# Patient Record
Sex: Female | Born: 1960 | Race: White | Hispanic: No | Smoking: Never smoker
Health system: Southern US, Community
[De-identification: ages and names within clinical notes are randomized; demographics above are authoritative.]

## PROBLEM LIST (undated history)

## (undated) DIAGNOSIS — H269 Unspecified cataract: Secondary | ICD-10-CM

## (undated) DIAGNOSIS — M1990 Unspecified osteoarthritis, unspecified site: Secondary | ICD-10-CM

## (undated) DIAGNOSIS — R32 Unspecified urinary incontinence: Secondary | ICD-10-CM

## (undated) DIAGNOSIS — I498 Other specified cardiac arrhythmias: Secondary | ICD-10-CM

## (undated) DIAGNOSIS — D649 Anemia, unspecified: Secondary | ICD-10-CM

## (undated) DIAGNOSIS — M8580 Other specified disorders of bone density and structure, unspecified site: Secondary | ICD-10-CM

## (undated) DIAGNOSIS — Z803 Family history of malignant neoplasm of breast: Secondary | ICD-10-CM

## (undated) DIAGNOSIS — Z8049 Family history of malignant neoplasm of other genital organs: Secondary | ICD-10-CM

## (undated) DIAGNOSIS — K219 Gastro-esophageal reflux disease without esophagitis: Secondary | ICD-10-CM

## (undated) DIAGNOSIS — G4700 Insomnia, unspecified: Secondary | ICD-10-CM

## (undated) DIAGNOSIS — E782 Mixed hyperlipidemia: Secondary | ICD-10-CM

## (undated) DIAGNOSIS — K5730 Diverticulosis of large intestine without perforation or abscess without bleeding: Secondary | ICD-10-CM

## (undated) DIAGNOSIS — IMO0002 Reserved for concepts with insufficient information to code with codable children: Secondary | ICD-10-CM

## (undated) DIAGNOSIS — H6982 Other specified disorders of Eustachian tube, left ear: Secondary | ICD-10-CM

## (undated) HISTORY — DX: Anemia, unspecified: D64.9

## (undated) HISTORY — DX: Mixed hyperlipidemia: E78.2

## (undated) HISTORY — DX: Family history of malignant neoplasm of other genital organs: Z80.49

## (undated) HISTORY — DX: Reserved for concepts with insufficient information to code with codable children: IMO0002

## (undated) HISTORY — DX: Insomnia, unspecified: G47.00

## (undated) HISTORY — PX: COLONOSCOPY: SHX174

## (undated) HISTORY — DX: Family history of malignant neoplasm of breast: Z80.3

## (undated) HISTORY — DX: Unspecified cataract: H26.9

## (undated) HISTORY — DX: Other specified cardiac arrhythmias: I49.8

## (undated) HISTORY — DX: Unspecified urinary incontinence: R32

## (undated) HISTORY — DX: Other specified disorders of eustachian tube, left ear: H69.82

## (undated) HISTORY — DX: Other specified disorders of bone density and structure, unspecified site: M85.80

## (undated) HISTORY — PX: WISDOM TOOTH EXTRACTION: SHX21

## (undated) HISTORY — DX: Diverticulosis of large intestine without perforation or abscess without bleeding: K57.30

## (undated) HISTORY — DX: Gastro-esophageal reflux disease without esophagitis: K21.9

---

## 2001-07-30 LAB — HM PAP SMEAR: HM Pap smear: NORMAL

## 2002-07-30 LAB — HM PAP SMEAR: HM Pap smear: NORMAL

## 2002-12-29 LAB — HM MAMMOGRAPHY: HM Mammogram: 4

## 2004-07-30 LAB — HM PAP SMEAR
HM Pap smear: NORMAL
HM Pap smear: NORMAL

## 2004-10-28 LAB — HM MAMMOGRAPHY: HM Mammogram: 4

## 2005-07-30 LAB — HM PAP SMEAR: HM Pap smear: NORMAL

## 2005-12-28 LAB — HM MAMMOGRAPHY

## 2006-12-29 LAB — HM MAMMOGRAPHY

## 2009-07-30 HISTORY — PX: COLONOSCOPY: SHX174

## 2009-10-25 LAB — HM COLONOSCOPY

## 2010-05-30 HISTORY — PX: VAGINAL HYSTERECTOMY: SUR661

## 2013-01-19 ENCOUNTER — Ambulatory Visit (INDEPENDENT_AMBULATORY_CARE_PROVIDER_SITE_OTHER): Payer: BC Managed Care – PPO | Admitting: Nurse Practitioner

## 2013-01-19 ENCOUNTER — Other Ambulatory Visit: Payer: BC Managed Care – PPO

## 2013-01-19 ENCOUNTER — Encounter: Payer: Self-pay | Admitting: Nurse Practitioner

## 2013-01-19 VITALS — BP 130/80 | HR 79 | Temp 98.0°F | Resp 12 | Ht 64.0 in | Wt 164.0 lb

## 2013-01-19 DIAGNOSIS — G4700 Insomnia, unspecified: Secondary | ICD-10-CM | POA: Insufficient documentation

## 2013-01-19 DIAGNOSIS — Z0000 Encounter for general adult medical examination without abnormal findings: Secondary | ICD-10-CM

## 2013-01-19 DIAGNOSIS — Z8249 Family history of ischemic heart disease and other diseases of the circulatory system: Secondary | ICD-10-CM | POA: Insufficient documentation

## 2013-01-19 DIAGNOSIS — M545 Low back pain, unspecified: Secondary | ICD-10-CM

## 2013-01-19 DIAGNOSIS — Z1239 Encounter for other screening for malignant neoplasm of breast: Secondary | ICD-10-CM

## 2013-01-19 MED ORDER — ZOLPIDEM TARTRATE 10 MG PO TABS: 10.0000 mg | ORAL_TABLET | Freq: Every evening | ORAL | Status: DC | PRN

## 2013-01-19 NOTE — Patient Instructions (Addendum)
Preventive Care for Adults, Female A healthy lifestyle and preventive care can promote health and wellness. Preventive health guidelines for women include the following key practices.  A routine yearly physical is a good way to check with your caregiver about your health and preventive screening. It is a chance to share any concerns and updates on your health, and to receive a thorough exam.  Visit your dentist for a routine exam and preventive care every 6 months. Brush your teeth twice a day and floss once a day. Good oral hygiene prevents tooth decay and gum disease.  The frequency of eye exams is based on your age, health, family medical history, use of contact lenses, and other factors. Follow your caregiver's recommendations for frequency of eye exams.  Eat a healthy diet. Foods like vegetables, fruits, whole grains, low-fat dairy products, and lean protein foods contain the nutrients you need without too many calories. Decrease your intake of foods high in solid fats, added sugars, and salt. Eat the right amount of calories for you.Get information about a proper diet from your caregiver, if necessary.  Regular physical exercise is one of the most important things you can do for your health. Most adults should get at least 150 minutes of moderate-intensity exercise (any activity that increases your heart rate and causes you to sweat) each week. In addition, most adults need muscle-strengthening exercises on 2 or more days a week.  Maintain a healthy weight. The body mass index (BMI) is a screening tool to identify possible weight problems. It provides an estimate of body fat based on height and weight. Your caregiver can help determine your BMI, and can help you achieve or maintain a healthy weight.For adults 20 years and older:  A BMI below 18.5 is considered underweight.  A BMI of 18.5 to 24.9 is normal.  A BMI of 25 to 29.9 is considered overweight.  A BMI of 30 and above is  considered obese.  Maintain normal blood lipids and cholesterol levels by exercising and minimizing your intake of saturated fat. Eat a balanced diet with plenty of fruit and vegetables. Blood tests for lipids and cholesterol should begin at age 20 and be repeated every 5 years. If your lipid or cholesterol levels are high, you are over 50, or you are at high risk for heart disease, you may need your cholesterol levels checked more frequently.Ongoing high lipid and cholesterol levels should be treated with medicines if diet and exercise are not effective.  If you smoke, find out from your caregiver how to quit. If you do not use tobacco, do not start.  If you are pregnant, do not drink alcohol. If you are breastfeeding, be very cautious about drinking alcohol. If you are not pregnant and choose to drink alcohol, do not exceed 1 drink per day. One drink is considered to be 12 ounces (355 mL) of beer, 5 ounces (148 mL) of wine, or 1.5 ounces (44 mL) of liquor.  Avoid use of street drugs. Do not share needles with anyone. Ask for help if you need support or instructions about stopping the use of drugs.  High blood pressure causes heart disease and increases the risk of stroke. Your blood pressure should be checked at least every 1 to 2 years. Ongoing high blood pressure should be treated with medicines if weight loss and exercise are not effective.  If you are 55 to 54 years old, ask your caregiver if you should take aspirin to prevent strokes.  Diabetes   screening involves taking a blood sample to check your fasting blood sugar level. This should be done once every 3 years, after age 45, if you are within normal weight and without risk factors for diabetes. Testing should be considered at a younger age or be carried out more frequently if you are overweight and have at least 1 risk factor for diabetes.  Breast cancer screening is essential preventive care for women. You should practice "breast  self-awareness." This means understanding the normal appearance and feel of your breasts and may include breast self-examination. Any changes detected, no matter how small, should be reported to a caregiver. Women in their 20s and 30s should have a clinical breast exam (CBE) by a caregiver as part of a regular health exam every 1 to 3 years. After age 40, women should have a CBE every year. Starting at age 40, women should consider having a mammography (breast X-ray test) every year. Women who have a family history of breast cancer should talk to their caregiver about genetic screening. Women at a high risk of breast cancer should talk to their caregivers about having magnetic resonance imaging (MRI) and a mammography every year.  The Pap test is a screening test for cervical cancer. A Pap test can show cell changes on the cervix that might become cervical cancer if left untreated. A Pap test is a procedure in which cells are obtained and examined from the lower end of the uterus (cervix).  Women should have a Pap test starting at age 21.  Between ages 21 and 29, Pap tests should be repeated every 2 years.  Beginning at age 30, you should have a Pap test every 3 years as long as the past 3 Pap tests have been normal.  Some women have medical problems that increase the chance of getting cervical cancer. Talk to your caregiver about these problems. It is especially important to talk to your caregiver if a new problem develops soon after your last Pap test. In these cases, your caregiver may recommend more frequent screening and Pap tests.  The above recommendations are the same for women who have or have not gotten the vaccine for human papillomavirus (HPV).  If you had a hysterectomy for a problem that was not cancer or a condition that could lead to cancer, then you no longer need Pap tests. Even if you no longer need a Pap test, a regular exam is a good idea to make sure no other problems are  starting.  If you are between ages 65 and 70, and you have had normal Pap tests going back 10 years, you no longer need Pap tests. Even if you no longer need a Pap test, a regular exam is a good idea to make sure no other problems are starting.  If you have had past treatment for cervical cancer or a condition that could lead to cancer, you need Pap tests and screening for cancer for at least 20 years after your treatment.  If Pap tests have been discontinued, risk factors (such as a new sexual partner) need to be reassessed to determine if screening should be resumed.  The HPV test is an additional test that may be used for cervical cancer screening. The HPV test looks for the virus that can cause the cell changes on the cervix. The cells collected during the Pap test can be tested for HPV. The HPV test could be used to screen women aged 30 years and older, and should   be used in women of any age who have unclear Pap test results. After the age of 30, women should have HPV testing at the same frequency as a Pap test.  Colorectal cancer can be detected and often prevented. Most routine colorectal cancer screening begins at the age of 50 and continues through age 75. However, your caregiver may recommend screening at an earlier age if you have risk factors for colon cancer. On a yearly basis, your caregiver may provide home test kits to check for hidden blood in the stool. Use of a small camera at the end of a tube, to directly examine the colon (sigmoidoscopy or colonoscopy), can detect the earliest forms of colorectal cancer. Talk to your caregiver about this at age 50, when routine screening begins. Direct examination of the colon should be repeated every 5 to 10 years through age 75, unless early forms of pre-cancerous polyps or small growths are found.  Hepatitis C blood testing is recommended for all people born from 1945 through 1965 and any individual with known risks for hepatitis C.  Practice  safe sex. Use condoms and avoid high-risk sexual practices to reduce the spread of sexually transmitted infections (STIs). STIs include gonorrhea, chlamydia, syphilis, trichomonas, herpes, HPV, and human immunodeficiency virus (HIV). Herpes, HIV, and HPV are viral illnesses that have no cure. They can result in disability, cancer, and death. Sexually active women aged 25 and younger should be checked for chlamydia. Older women with new or multiple partners should also be tested for chlamydia. Testing for other STIs is recommended if you are sexually active and at increased risk.  Osteoporosis is a disease in which the bones lose minerals and strength with aging. This can result in serious bone fractures. The risk of osteoporosis can be identified using a bone density scan. Women ages 65 and over and women at risk for fractures or osteoporosis should discuss screening with their caregivers. Ask your caregiver whether you should take a calcium supplement or vitamin D to reduce the rate of osteoporosis.  Menopause can be associated with physical symptoms and risks. Hormone replacement therapy is available to decrease symptoms and risks. You should talk to your caregiver about whether hormone replacement therapy is right for you.  Use sunscreen with sun protection factor (SPF) of 30 or more. Apply sunscreen liberally and repeatedly throughout the day. You should seek shade when your shadow is shorter than you. Protect yourself by wearing long sleeves, pants, a wide-brimmed hat, and sunglasses year round, whenever you are outdoors.  Once a month, do a whole body skin exam, using a mirror to look at the skin on your back. Notify your caregiver of new moles, moles that have irregular borders, moles that are larger than a pencil eraser, or moles that have changed in shape or color.  Stay current with required immunizations.  Influenza. You need a dose every fall (or winter). The composition of the flu vaccine  changes each year, so being vaccinated once is not enough.  Pneumococcal polysaccharide. You need 1 to 2 doses if you smoke cigarettes or if you have certain chronic medical conditions. You need 1 dose at age 65 (or older) if you have never been vaccinated.  Tetanus, diphtheria, pertussis (Tdap, Td). Get 1 dose of Tdap vaccine if you are younger than age 65, are over 65 and have contact with an infant, are a healthcare worker, are pregnant, or simply want to be protected from whooping cough. After that, you need a Td   booster dose every 10 years. Consult your caregiver if you have not had at least 3 tetanus and diphtheria-containing shots sometime in your life or have a deep or dirty wound.  HPV. You need this vaccine if you are a woman age 26 or younger. The vaccine is given in 3 doses over 6 months.  Measles, mumps, rubella (MMR). You need at least 1 dose of MMR if you were born in 1957 or later. You may also need a second dose.  Meningococcal. If you are age 19 to 21 and a first-year college student living in a residence hall, or have one of several medical conditions, you need to get vaccinated against meningococcal disease. You may also need additional booster doses.  Zoster (shingles). If you are age 60 or older, you should get this vaccine.  Varicella (chickenpox). If you have never had chickenpox or you were vaccinated but received only 1 dose, talk to your caregiver to find out if you need this vaccine.  Hepatitis A. You need this vaccine if you have a specific risk factor for hepatitis A virus infection or you simply wish to be protected from this disease. The vaccine is usually given as 2 doses, 6 to 18 months apart.  Hepatitis B. You need this vaccine if you have a specific risk factor for hepatitis B virus infection or you simply wish to be protected from this disease. The vaccine is given in 3 doses, usually over 6 months. Preventive Services / Frequency Ages 19 to 39  Blood  pressure check.** / Every 1 to 2 years.  Lipid and cholesterol check.** / Every 5 years beginning at age 20.  Clinical breast exam.** / Every 3 years for women in their 20s and 30s.  Pap test.** / Every 2 years from ages 21 through 29. Every 3 years starting at age 30 through age 65 or 70 with a history of 3 consecutive normal Pap tests.  HPV screening.** / Every 3 years from ages 30 through ages 65 to 70 with a history of 3 consecutive normal Pap tests.  Hepatitis C blood test.** / For any individual with known risks for hepatitis C.  Skin self-exam. / Monthly.  Influenza immunization.** / Every year.  Pneumococcal polysaccharide immunization.** / 1 to 2 doses if you smoke cigarettes or if you have certain chronic medical conditions.  Tetanus, diphtheria, pertussis (Tdap, Td) immunization. / A one-time dose of Tdap vaccine. After that, you need a Td booster dose every 10 years.  HPV immunization. / 3 doses over 6 months, if you are 26 and younger.  Measles, mumps, rubella (MMR) immunization. / You need at least 1 dose of MMR if you were born in 1957 or later. You may also need a second dose.  Meningococcal immunization. / 1 dose if you are age 19 to 21 and a first-year college student living in a residence hall, or have one of several medical conditions, you need to get vaccinated against meningococcal disease. You may also need additional booster doses.  Varicella immunization.** / Consult your caregiver.  Hepatitis A immunization.** / Consult your caregiver. 2 doses, 6 to 18 months apart.  Hepatitis B immunization.** / Consult your caregiver. 3 doses usually over 6 months. Ages 40 to 64  Blood pressure check.** / Every 1 to 2 years.  Lipid and cholesterol check.** / Every 5 years beginning at age 20.  Clinical breast exam.** / Every year after age 40.  Mammogram.** / Every year beginning at age 40   and continuing for as long as you are in good health. Consult with your  caregiver.  Pap test.** / Every 3 years starting at age 30 through age 65 or 70 with a history of 3 consecutive normal Pap tests.  HPV screening.** / Every 3 years from ages 30 through ages 65 to 70 with a history of 3 consecutive normal Pap tests.  Fecal occult blood test (FOBT) of stool. / Every year beginning at age 50 and continuing until age 75. You may not need to do this test if you get a colonoscopy every 10 years.  Flexible sigmoidoscopy or colonoscopy.** / Every 5 years for a flexible sigmoidoscopy or every 10 years for a colonoscopy beginning at age 50 and continuing until age 75.  Hepatitis C blood test.** / For all people born from 1945 through 1965 and any individual with known risks for hepatitis C.  Skin self-exam. / Monthly.  Influenza immunization.** / Every year.  Pneumococcal polysaccharide immunization.** / 1 to 2 doses if you smoke cigarettes or if you have certain chronic medical conditions.  Tetanus, diphtheria, pertussis (Tdap, Td) immunization.** / A one-time dose of Tdap vaccine. After that, you need a Td booster dose every 10 years.  Measles, mumps, rubella (MMR) immunization. / You need at least 1 dose of MMR if you were born in 1957 or later. You may also need a second dose.  Varicella immunization.** / Consult your caregiver.  Meningococcal immunization.** / Consult your caregiver.  Hepatitis A immunization.** / Consult your caregiver. 2 doses, 6 to 18 months apart.  Hepatitis B immunization.** / Consult your caregiver. 3 doses, usually over 6 months. Ages 65 and over  Blood pressure check.** / Every 1 to 2 years.  Lipid and cholesterol check.** / Every 5 years beginning at age 20.  Clinical breast exam.** / Every year after age 40.  Mammogram.** / Every year beginning at age 40 and continuing for as long as you are in good health. Consult with your caregiver.  Pap test.** / Every 3 years starting at age 30 through age 65 or 70 with a 3  consecutive normal Pap tests. Testing can be stopped between 65 and 70 with 3 consecutive normal Pap tests and no abnormal Pap or HPV tests in the past 10 years.  HPV screening.** / Every 3 years from ages 30 through ages 65 or 70 with a history of 3 consecutive normal Pap tests. Testing can be stopped between 65 and 70 with 3 consecutive normal Pap tests and no abnormal Pap or HPV tests in the past 10 years.  Fecal occult blood test (FOBT) of stool. / Every year beginning at age 50 and continuing until age 75. You may not need to do this test if you get a colonoscopy every 10 years.  Flexible sigmoidoscopy or colonoscopy.** / Every 5 years for a flexible sigmoidoscopy or every 10 years for a colonoscopy beginning at age 50 and continuing until age 75.  Hepatitis C blood test.** / For all people born from 1945 through 1965 and any individual with known risks for hepatitis C.  Osteoporosis screening.** / A one-time screening for women ages 65 and over and women at risk for fractures or osteoporosis.  Skin self-exam. / Monthly.  Influenza immunization.** / Every year.  Pneumococcal polysaccharide immunization.** / 1 dose at age 65 (or older) if you have never been vaccinated.  Tetanus, diphtheria, pertussis (Tdap, Td) immunization. / A one-time dose of Tdap vaccine if you are over   65 and have contact with an infant, are a Research scientist (physical sciences), or simply want to be protected from whooping cough. After that, you need a Td booster dose every 10 years.  Varicella immunization.** / Consult your caregiver.  Meningococcal immunization.** / Consult your caregiver.  Hepatitis A immunization.** / Consult your caregiver. 2 doses, 6 to 18 months apart.  Hepatitis B immunization.** / Check with your caregiver. 3 doses, usually over 6 months. ** Family history and personal history of risk and conditions may change your caregiver's recommendations. Document Released: 09/11/2001 Document Revised: 10/08/2011  Document Reviewed: 12/11/2010 [facility] Patient Information 2014 [place], [place]. Insomnia Insomnia is frequent trouble falling and/or staying asleep. Insomnia can be a long term problem or a short term problem. Both are common. Insomnia can be a short term problem when the wakefulness is related to a certain stress or worry. Long term insomnia is often related to ongoing stress during waking hours and/or poor sleeping habits. Overtime, sleep deprivation itself can make the problem worse. Every little thing feels more severe because you are overtired and your ability to cope is decreased. CAUSES   Stress, anxiety, and depression.  Poor sleeping habits.  Distractions such as TV in the bedroom.  Naps close to bedtime.  Engaging in emotionally charged conversations before bed.  Technical reading before sleep.  Alcohol and other sedatives. They may make the problem worse. They can hurt normal sleep patterns and normal dream activity.  Stimulants such as caffeine for several hours prior to bedtime.  Pain syndromes and shortness of breath can cause insomnia.  Exercise late at night.  Changing time zones may cause sleeping problems (jet lag). It is sometimes helpful to have someone observe your sleeping patterns. They should look for periods of not breathing during the night (sleep apnea). They should also look to see how long those periods last. If you live alone or observers are uncertain, you can also be observed at a sleep clinic where your sleep patterns will be professionally monitored. Sleep apnea requires a checkup and treatment. Give your caregivers your medical history. Give your caregivers observations your family has made about your sleep.  SYMPTOMS   Not feeling rested in the morning.  Anxiety and restlessness at bedtime.  Difficulty falling and staying asleep. TREATMENT   Your caregiver may prescribe treatment for an underlying medical disorders. Your caregiver can give  advice or help if you are using alcohol or other drugs for self-medication. Treatment of underlying problems will usually eliminate insomnia problems.  Medications can be prescribed for short time use. They are generally not recommended for lengthy use.  Over-the-counter sleep medicines are not recommended for lengthy use. They can be habit forming.  You can promote easier sleeping by making lifestyle changes such as:  Using relaxation techniques that help with breathing and reduce muscle tension.  Exercising earlier in the day.  Changing your diet and the time of your last meal. No night time snacks.  Establish a regular time to go to bed.  Counseling can help with stressful problems and worry.  Soothing music and [place] noise may be helpful if there are background noises you cannot remove.  Stop tedious detailed work at least one hour before bedtime. HOME CARE INSTRUCTIONS   Keep a diary. Inform your caregiver about your progress. This includes any medication side effects. See your caregiver regularly. Take note of:  Times when you are asleep.  Times when you are awake during the night.  The quality of your  sleep.  How you feel the next day. This information will help your caregiver care for you.  Get out of bed if you are still awake after 15 minutes. Read or do some quiet activity. Keep the lights down. Wait until you feel sleepy and go back to bed.  Keep regular sleeping and waking hours. Avoid naps.  Exercise regularly.  Avoid distractions at bedtime. Distractions include watching television or engaging in any intense or detailed activity like attempting to balance the household checkbook.  Develop a bedtime ritual. Keep a familiar routine of bathing, brushing your teeth, climbing into bed at the same time each night, listening to soothing music. Routines increase the success of falling to sleep faster.  Use relaxation techniques. This can be using breathing and  muscle tension release routines. It can also include visualizing peaceful scenes. You can also help control troubling or intruding thoughts by keeping your mind occupied with boring or repetitive thoughts like the old concept of counting sheep. You can make it more creative like imagining planting one beautiful flower after another in your backyard garden.  During your day, work to eliminate stress. When this is not possible use some of the previous suggestions to help reduce the anxiety that accompanies stressful situations. MAKE SURE YOU:   Understand these instructions.  Will watch your condition.  Will get help right away if you are not doing well or get worse. Document Released: 07/13/2000 Document Revised: 10/08/2011 Document Reviewed: 08/13/2007 [facility] Patient Information 2014 [place], [place].  Back Exercises Back exercises help treat and prevent back injuries. The goal of back exercises is to increase the strength of your abdominal and back muscles and the flexibility of your back. These exercises should be started when you no longer have back pain. Back exercises include:  Pelvic Tilt. Lie on your back with your knees bent. Tilt your pelvis until the lower part of your back is against the floor. Hold this position 5 to 10 sec and repeat 5 to 10 times.  Knee to Chest. Pull first 1 knee up against your chest and hold for 20 to 30 seconds, repeat this with the other knee, and then both knees. This may be done with the other leg straight or bent, whichever feels better.  Sit-Ups or Curl-Ups. Bend your knees 90 degrees. Start with tilting your pelvis, and do a partial, slow sit-up, lifting your trunk only 30 to 45 degrees off the floor. Take at least 2 to 3 seconds for each sit-up. Do not do sit-ups with your knees out straight. If partial sit-ups are difficult, simply do the above but with only tightening your abdominal muscles and holding it as directed.  Hip-Lift. Lie on your back with  your knees flexed 90 degrees. Push down with your feet and shoulders as you raise your hips a couple inches off the floor; hold for 10 seconds, repeat 5 to 10 times.  Back arches. Lie on your stomach, propping yourself up on bent elbows. Slowly press on your hands, causing an arch in your low back. Repeat 3 to 5 times. Any initial stiffness and discomfort should lessen with repetition over time.  Shoulder-Lifts. Lie face down with arms beside your body. Keep hips and torso pressed to floor as you slowly lift your head and shoulders off the floor. Do not overdo your exercises, especially in the beginning. Exercises may cause you some mild back discomfort which lasts for a few minutes; however, if the pain is more severe, or lasts  for more than 15 minutes, do not continue exercises until you see your caregiver. Improvement with exercise therapy for back problems is slow.  See your caregivers for assistance with developing a proper back exercise program. Document Released: 08/23/2004 Document Revised: 10/08/2011 Document Reviewed: 05/17/2011 [facility] Patient Information 2014 [place], [place].

## 2013-01-20 ENCOUNTER — Encounter: Payer: Self-pay | Admitting: Family Medicine

## 2013-01-20 ENCOUNTER — Encounter: Payer: Self-pay | Admitting: Nurse Practitioner

## 2013-01-20 DIAGNOSIS — M545 Low back pain, unspecified: Secondary | ICD-10-CM | POA: Insufficient documentation

## 2013-01-20 NOTE — Progress Notes (Signed)
Subjective:     [name] is a 52 y.o. female and is here to establish care. The patient reports insomnia for 5 years that has been treated with [place] with satisfactory results. Also she c/o occasional low back pain that flares with activity such as lifting. Surgical history includes partial hysterectomy in 2011 for uterine prolapse. She denies perimenopausal symptoms such as hot flashes.  Family Hx includes premature heart disease from her father who has an MI at age 6. No other significant history.Marland Kitchen  History   Social History  . Marital Status: Married    Spouse Name: Cheree Ditto    Number of Children: 2  . Years of Education: 4   Occupational History  . teacher, reading specialist k-6    Social History Main Topics  . Smoking status: Never Smoker   . Smokeless tobacco: Never Used  . Alcohol Use: Yes     Comment: approx. 6 times/year  . Drug Use: No  . Sexually Active: Yes     Comment: hysterectomy   Other Topics Concern  . Not on file   Social History Narrative   Relocated to [facility] [place] from [place]. Due to husband job transfer. 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 This fall.      Has had regular preventive care in [place]. At [facility] w/Dr. Criss Alvine. Only health concern is insomnia that developed about 5 years ago & has been treated with [place]. She has not practiced sleep hygiene.   No health maintenance topics applied.  The following portions of the patient's history were reviewed and updated as appropriate: allergies, current medications, past family history, past medical history, past social history, past surgical history and problem list.  Review of Systems Constitutional: negative for anorexia, fatigue, fevers, night sweats and weight loss Eyes: positive for contacts/glasses Ears, nose, mouth, throat, and face: negative for hearing loss,  nasal congestion, sore throat and voice change Respiratory: negative for asthma, cough, dyspnea on exertion and wheezing Cardiovascular: positive for palpitations, negative for chest pain, chest pressure/discomfort, exertional chest pressure/discomfort, fatigue and lower extremity edema Gastrointestinal: negative for abdominal pain, change in bowel habits, constipation, diarrhea, dyspepsia and reflux symptoms Genitourinary:positive for occasional UTI Integument/breast: negative, pt sees dermatology yearly for "mole check" Musculoskeletal:positive for back pain Neurological: negative for headaches, memory problems and weakness Behavioral/Psych: positive for sleep disturbance, negative for anxiety and tobacco use Endocrine: negative for diabetic symptoms including blurry vision, increased fatigue, polydipsia, polyphagia and polyuria and temperature intolerance Allergic/Immunologic: negative for anaphylaxis and hay fever   Objective:    There were no vitals taken for this visit. General appearance: alert, cooperative, appears stated age and mild distress Head: Normocephalic, without obvious abnormality, atraumatic Eyes: negative findings: lids and lashes normal, conjunctivae and sclerae normal, corneas clear and pupils equal, round, reactive to light and accomodation, wearing glasses Ears: normal external ear and canal bilaterally, effusion w/clear fluid bilaterally Throat: lips, mucosa, and tongue normal; teeth and gums normal Neck: no adenopathy, no carotid bruit, supple, symmetrical, trachea midline and thyroid not enlarged, symmetric, no tenderness/mass/nodules Lungs: clear to auscultation bilaterally Heart: regular rate and rhythm, S1, S2 normal, no murmur, click, rub or gallop Abdomen: soft, non-tender; bowel sounds normal; no masses,  no organomegaly Extremities: extremities normal, atraumatic, no cyanosis or edema Pulses: 2+ and symmetric Lymph nodes: Cervical, supraclavicular, and  axillary nodes normal.  Neurologic: Alert and oriented X 3, normal strength and tone. Normal symmetric reflexes. Normal coordination and gait    Assessment:    1.Insomnia 2.recurrent back strain 3.Health Maintenance       Plan:    1. Historically treated w/ambien. Discussed concerns with potential side effects, discussed sleep hygiene. Wishes to continue with ambien. 2.Takes flexeril as needed. Discussed back stretches. Gave info for back health. 3.PAP, MMG, COlonoscopy, & Dexa up to date. All normal per  Pt report, except DEXA showed osteopenia. Uncertain about Tdap-will review records when received. See After Visit Summary for Counseling Recommendations

## 2013-01-26 ENCOUNTER — Other Ambulatory Visit (INDEPENDENT_AMBULATORY_CARE_PROVIDER_SITE_OTHER): Payer: BC Managed Care – PPO

## 2013-01-26 DIAGNOSIS — M545 Low back pain, unspecified: Secondary | ICD-10-CM

## 2013-01-26 DIAGNOSIS — Z0000 Encounter for general adult medical examination without abnormal findings: Secondary | ICD-10-CM

## 2013-01-26 LAB — CBC WITH DIFFERENTIAL/PLATELET
Eosinophils Absolute: 0.1 10*3/uL (ref 0.0–0.7)
Eosinophils Relative: 1.7 % (ref 0.0–5.0)
MCHC: 33.8 g/dL (ref 30.0–36.0)
MCV: 91.1 fl (ref 78.0–100.0)
Monocytes Absolute: 0.5 10*3/uL (ref 0.1–1.0)
Neutrophils Relative %: 54.7 % (ref 43.0–77.0)
Platelets: 261 10*3/uL (ref 150.0–400.0)
WBC: 6.7 10*3/uL (ref 4.5–10.5)

## 2013-01-26 LAB — HEPATIC FUNCTION PANEL
Albumin: 4.2 g/dL (ref 3.5–5.2)
Alkaline Phosphatase: 77 U/L (ref 39–117)
Bilirubin, Direct: 0.1 mg/dL (ref 0.0–0.3)
Total Bilirubin: 0.5 mg/dL (ref 0.3–1.2)

## 2013-01-26 LAB — LIPID PANEL
HDL: 36.9 mg/dL — ABNORMAL LOW (ref >39.00)
Triglycerides: 592 mg/dL — ABNORMAL HIGH (ref 0.0–149.0)

## 2013-01-26 LAB — RENAL FUNCTION PANEL
BUN: 17 mg/dL (ref 6–23)
CO2: 23 mEq/L (ref 19–32)
Calcium: 9.4 mg/dL (ref 8.4–10.5)
Chloride: 104 mEq/L (ref 96–112)
GFR: 72.21 mL/min (ref >60.00)

## 2013-01-26 NOTE — Progress Notes (Signed)
Labs only

## 2013-01-27 ENCOUNTER — Ambulatory Visit: Payer: BC Managed Care – PPO

## 2013-01-27 ENCOUNTER — Telehealth: Payer: Self-pay | Admitting: Nurse Practitioner

## 2013-01-27 ENCOUNTER — Other Ambulatory Visit: Payer: BC Managed Care – PPO

## 2013-01-27 DIAGNOSIS — E781 Pure hyperglyceridemia: Secondary | ICD-10-CM

## 2013-01-27 DIAGNOSIS — E559 Vitamin D deficiency, unspecified: Secondary | ICD-10-CM

## 2013-01-27 LAB — LIPID PANEL
Cholesterol: 221 mg/dL — ABNORMAL HIGH (ref 0–200)
Triglycerides: 555 mg/dL — ABNORMAL HIGH (ref <150)

## 2013-01-27 LAB — CBC WITH DIFFERENTIAL/PLATELET

## 2013-01-27 LAB — HEPATIC FUNCTION PANEL
ALT: 13 U/L (ref 0–35)
Total Protein: 6.8 g/dL (ref 6.0–8.3)

## 2013-01-27 LAB — RENAL FUNCTION PANEL
BUN: 16 mg/dL (ref 6–23)
Calcium: 9.3 mg/dL (ref 8.4–10.5)
Creat: 0.86 mg/dL (ref 0.50–1.10)
Phosphorus: 3.4 mg/dL (ref 2.3–4.6)
Potassium: 4 mEq/L (ref 3.5–5.3)

## 2013-01-27 LAB — VITAMIN D 25 HYDROXY (VIT D DEFICIENCY, FRACTURES): Vit D, 25-Hydroxy: 28 ng/mL — ABNORMAL LOW (ref 30–89)

## 2013-01-27 LAB — LDL CHOLESTEROL, DIRECT: Direct LDL: 105.7 mg/dL

## 2013-01-27 LAB — HEMOGLOBIN A1C: Hgb A1c MFr Bld: 6 % (ref 4.6–6.5)

## 2013-01-27 NOTE — Telephone Encounter (Signed)
Discussed labs. Will start vit d supplement for deficiency: 5000 iu D3 2 caps qd 5d weekly, recheck in 12 weeks.  Cholesterol panel shows LDl norm range, HDL slightly low, Trig very high-question accuracy of test. Will check again in 2 weeks. IN the meantime, discussed diet interventions to lower trig. Renal & kidney, blood count all normal.

## 2013-01-27 NOTE — Addendum Note (Signed)
Addended by: Baldemar Lenis R on: 01/27/2013 12:31 PM   Modules accepted: Orders

## 2013-02-10 ENCOUNTER — Other Ambulatory Visit (INDEPENDENT_AMBULATORY_CARE_PROVIDER_SITE_OTHER): Payer: BC Managed Care – PPO

## 2013-02-10 DIAGNOSIS — E559 Vitamin D deficiency, unspecified: Secondary | ICD-10-CM

## 2013-02-10 DIAGNOSIS — E781 Pure hyperglyceridemia: Secondary | ICD-10-CM

## 2013-02-10 LAB — LIPID PANEL
Cholesterol: 229 mg/dL — ABNORMAL HIGH (ref 0–200)
HDL: 41.1 mg/dL (ref >39.00)
Total CHOL/HDL Ratio: 6
Triglycerides: 197 mg/dL — ABNORMAL HIGH (ref 0.0–149.0)
VLDL: 39.4 mg/dL (ref 0.0–40.0)

## 2013-02-10 NOTE — Progress Notes (Signed)
Labs only

## 2013-02-11 ENCOUNTER — Telehealth: Payer: Self-pay | Admitting: Nurse Practitioner

## 2013-02-11 NOTE — Telephone Encounter (Signed)
Chol panel looks much better-question accuracy of 1st result, although pt has cut out sugar and is walking. Still taking vit D, will continue. Want to recheck level in 6 weeks, recheck lipids in 6 months.

## 2013-02-11 NOTE — Telephone Encounter (Signed)
Called patient to schedule lab appointments 03/24/13 at 8:15 am and 08/14/13 at 8:00 am.

## 2013-02-18 ENCOUNTER — Encounter: Payer: Self-pay | Admitting: Nurse Practitioner

## 2013-03-02 ENCOUNTER — Other Ambulatory Visit: Payer: Self-pay | Admitting: Emergency Medicine

## 2013-03-02 DIAGNOSIS — E559 Vitamin D deficiency, unspecified: Secondary | ICD-10-CM

## 2013-03-02 DIAGNOSIS — E781 Pure hyperglyceridemia: Secondary | ICD-10-CM

## 2013-03-18 ENCOUNTER — Other Ambulatory Visit (INDEPENDENT_AMBULATORY_CARE_PROVIDER_SITE_OTHER): Payer: BC Managed Care – PPO

## 2013-03-18 DIAGNOSIS — E559 Vitamin D deficiency, unspecified: Secondary | ICD-10-CM

## 2013-03-18 DIAGNOSIS — E781 Pure hyperglyceridemia: Secondary | ICD-10-CM

## 2013-03-18 LAB — LIPID PANEL
HDL: 40.7 mg/dL (ref >39.00)
LDL Cholesterol: 121 mg/dL — ABNORMAL HIGH (ref 0–99)
Total CHOL/HDL Ratio: 5
VLDL: 29.6 mg/dL (ref 0.0–40.0)

## 2013-03-19 ENCOUNTER — Telehealth: Payer: Self-pay | Admitting: Nurse Practitioner

## 2013-03-19 DIAGNOSIS — Z23 Encounter for immunization: Secondary | ICD-10-CM

## 2013-03-19 NOTE — Telephone Encounter (Signed)
Triglycerides have normalized since eliminating sugar. LDL 140. She has family history of premature cardiac disease. Will continue with dietary measures. Decrease vit d3 to 1000 iu daily. Needs tdap. Pt advised. Will see in 1 year for CPE, MMG due in Nov.

## 2013-03-23 NOTE — Telephone Encounter (Signed)
LMOVM for patient to call [facility] office to set up nurse visit for TDap injection.

## 2013-03-24 ENCOUNTER — Other Ambulatory Visit: Payer: BC Managed Care – PPO

## 2013-04-03 ENCOUNTER — Ambulatory Visit: Payer: BC Managed Care – PPO | Admitting: *Deleted

## 2013-04-03 DIAGNOSIS — Z23 Encounter for immunization: Secondary | ICD-10-CM

## 2013-04-03 MED ORDER — TETANUS-DIPHTH-ACELL PERTUSSIS 5-2.5-18.5 LF-MCG/0.5 IM SUSP
0.5000 mL | Freq: Once | INTRAMUSCULAR | Status: AC
  Administered 2013-04-03: 0.5 mL via INTRAMUSCULAR

## 2013-05-20 ENCOUNTER — Other Ambulatory Visit: Payer: Self-pay | Admitting: *Deleted

## 2013-05-20 DIAGNOSIS — G4700 Insomnia, unspecified: Secondary | ICD-10-CM

## 2013-05-20 MED ORDER — ZOLPIDEM TARTRATE 10 MG PO TABS: 10.0000 mg | ORAL_TABLET | Freq: Every evening | ORAL | Status: DC | PRN

## 2013-05-20 NOTE — Telephone Encounter (Signed)
Refill request for zolpidem Last filled by MD on - 01/19/13 Last Appt: 01/19/13 Next Appt: none Patient has 0 remaining. Please advise refill?

## 2013-06-12 ENCOUNTER — Ambulatory Visit (HOSPITAL_BASED_OUTPATIENT_CLINIC_OR_DEPARTMENT_OTHER)
Admission: RE | Admit: 2013-06-12 | Discharge: 2013-06-12 | Disposition: A | Discharge status: Home / Self Care | Payer: BC Managed Care – PPO | Source: Ambulatory Visit | Attending: Nurse Practitioner | Admitting: Nurse Practitioner

## 2013-06-12 ENCOUNTER — Ambulatory Visit (HOSPITAL_BASED_OUTPATIENT_CLINIC_OR_DEPARTMENT_OTHER): Payer: BC Managed Care – PPO

## 2013-06-12 DIAGNOSIS — Z1231 Encounter for screening mammogram for malignant neoplasm of breast: Secondary | ICD-10-CM | POA: Insufficient documentation

## 2013-06-12 DIAGNOSIS — Z1239 Encounter for other screening for malignant neoplasm of breast: Secondary | ICD-10-CM

## 2013-07-09 ENCOUNTER — Telehealth: Payer: Self-pay | Admitting: *Deleted

## 2013-07-09 ENCOUNTER — Encounter: Payer: Self-pay | Admitting: *Deleted

## 2013-07-09 NOTE — Telephone Encounter (Signed)
Please call pt: advise MMG was normal. She should have received a letter. Please print result & mail to her.

## 2013-07-09 NOTE — Telephone Encounter (Signed)
Patient returned call, patient given results. Also copy of results were mailed to patient's home address.

## 2013-07-09 NOTE — Telephone Encounter (Signed)
LMOVM for patient to return call.

## 2013-07-09 NOTE — Telephone Encounter (Signed)
Patient left vm stating that she has not received the results for her mammogram that she had done on 06/12/13 at [facility]. Patient would like a call back with results. Please advise?

## 2013-08-14 ENCOUNTER — Other Ambulatory Visit: Payer: BC Managed Care – PPO

## 2013-08-19 ENCOUNTER — Other Ambulatory Visit: Payer: Self-pay | Admitting: *Deleted

## 2013-08-19 DIAGNOSIS — G4700 Insomnia, unspecified: Secondary | ICD-10-CM

## 2013-08-19 MED ORDER — ZOLPIDEM TARTRATE 10 MG PO TABS: 10.0000 mg | ORAL_TABLET | Freq: Every evening | ORAL | Status: DC | PRN

## 2013-08-19 NOTE — Telephone Encounter (Signed)
Refill request for zolpidem Last filled by MD on - 05/20/13 #30 x2 Last Appt: 01/19/2013 Next Appt: none Please advise refill? Patient should have 0 pills left. Patient also has Lab appt for 08/21/2013.

## 2013-08-21 ENCOUNTER — Encounter: Payer: Self-pay | Admitting: Nurse Practitioner

## 2013-08-21 ENCOUNTER — Other Ambulatory Visit (INDEPENDENT_AMBULATORY_CARE_PROVIDER_SITE_OTHER): Payer: BC Managed Care – PPO

## 2013-08-21 DIAGNOSIS — E785 Hyperlipidemia, unspecified: Secondary | ICD-10-CM

## 2013-08-21 LAB — LDL CHOLESTEROL, DIRECT: Direct LDL: 163.9 mg/dL

## 2013-08-21 LAB — LIPID PANEL
CHOL/HDL RATIO: 5
Cholesterol: 240 mg/dL — ABNORMAL HIGH (ref 0–200)
HDL: 45.2 mg/dL (ref >39.00)
Triglycerides: 197 mg/dL — ABNORMAL HIGH (ref 0.0–149.0)
VLDL: 39.4 mg/dL (ref 0.0–40.0)

## 2013-08-24 ENCOUNTER — Telehealth: Payer: Self-pay | Admitting: Nurse Practitioner

## 2013-08-24 NOTE — Telephone Encounter (Signed)
LMOVM for patient to return call concerning results.  

## 2013-08-24 NOTE — Telephone Encounter (Signed)
Overall cholesterol panel has improved. 10 yr risk for ASCVD is 2.77%, this does not indicate a statin, however, pt has fam Hx premature heart disease. Would like to discuss options with pt & get hs-CRP. To further evaluate heart risk.

## 2013-08-25 NOTE — Telephone Encounter (Signed)
Patient notified of results and appointment was scheduled for 08/28/13 at 9:30 am.

## 2013-08-26 ENCOUNTER — Other Ambulatory Visit: Payer: Self-pay | Admitting: Nurse Practitioner

## 2013-08-26 DIAGNOSIS — E559 Vitamin D deficiency, unspecified: Secondary | ICD-10-CM

## 2013-08-26 DIAGNOSIS — Z8249 Family history of ischemic heart disease and other diseases of the circulatory system: Secondary | ICD-10-CM

## 2013-08-26 DIAGNOSIS — E785 Hyperlipidemia, unspecified: Secondary | ICD-10-CM

## 2013-08-26 NOTE — Progress Notes (Signed)
Added Vit d level. Added hs-CRP due to dyslipidemia & fam HX of premature heart disease. [facility] aware.

## 2013-08-27 ENCOUNTER — Ambulatory Visit: Payer: BC Managed Care – PPO

## 2013-08-27 DIAGNOSIS — E785 Hyperlipidemia, unspecified: Secondary | ICD-10-CM

## 2013-08-27 DIAGNOSIS — Z8249 Family history of ischemic heart disease and other diseases of the circulatory system: Secondary | ICD-10-CM

## 2013-08-27 DIAGNOSIS — E559 Vitamin D deficiency, unspecified: Secondary | ICD-10-CM

## 2013-08-27 LAB — HIGH SENSITIVITY CRP: CRP, High Sensitivity: 2.11 mg/L (ref 0.000–5.000)

## 2013-08-28 ENCOUNTER — Encounter: Payer: Self-pay | Admitting: Nurse Practitioner

## 2013-08-28 ENCOUNTER — Other Ambulatory Visit: Payer: Self-pay | Admitting: Nurse Practitioner

## 2013-08-28 ENCOUNTER — Ambulatory Visit (INDEPENDENT_AMBULATORY_CARE_PROVIDER_SITE_OTHER): Payer: BC Managed Care – PPO | Admitting: Nurse Practitioner

## 2013-08-28 VITALS — BP 142/85 | HR 83 | Temp 98.5°F | Ht 64.0 in | Wt 150.2 lb

## 2013-08-28 DIAGNOSIS — M542 Cervicalgia: Secondary | ICD-10-CM

## 2013-08-28 DIAGNOSIS — E781 Pure hyperglyceridemia: Secondary | ICD-10-CM

## 2013-08-28 DIAGNOSIS — E559 Vitamin D deficiency, unspecified: Secondary | ICD-10-CM

## 2013-08-28 LAB — VITAMIN D 25 HYDROXY (VIT D DEFICIENCY, FRACTURES): VIT D 25 HYDROXY: 31 ng/mL (ref 30–89)

## 2013-08-28 NOTE — Progress Notes (Signed)
Pre-visit discussion using our clinic review tool. No additional management support is needed unless otherwise documented below in the visit note.  

## 2013-08-28 NOTE — Patient Instructions (Signed)
Start Vit D3 2000 iu qd. Start fish oil-1 to 2 T mega red krill oil daily for vascular health. Continue exercise & diet modification of cutting out sugar, Lisa flour, and processed foods preserved with mega 6 (corn oils, soy oils, vegetable oils) as these contribute to inflammation in vessels.   For neck pain do daily stretches in neck & shoulder. Also, you may use topical aspercreme. Natural anti-inflammatories can be used as well: Fresh ginger tea daily (grate 1-2 TBLS fresh ginger in & steep in hot water for 5 minutes); handful tart cherries or 1/2 cup tart cherry juice daily; 2-3 curmarin capsules daily. Start 1 supplement at a time & take for 2 weeks before adding a second or third, if needed. In addition you may take 200-600 mg ibuprophen twice daily. Caution: ibuprophen can cause stomach irritation. If there is no improvement or pain gets worse, we will xray neck.  Osteoarthritis Osteoarthritis is a disease that causes soreness and swelling (inflammation) of a joint. It occurs when the cartilage at the affected joint wears down. Cartilage acts as a cushion, covering the ends of bones where they meet to form a joint. Osteoarthritis is the most common form of arthritis. It often occurs in older people. The joints affected most often by this condition include those in the:  Ends of the fingers.  Thumbs.  Neck.  Lower back.  Knees.  Hips. CAUSES  Over time, the cartilage that covers the ends of bones begins to wear away. This causes bone to rub on bone, producing pain and stiffness in the affected joints.  RISK FACTORS Certain factors can increase your chances of having osteoarthritis, including:  Older age.  Excessive body weight.  Overuse of joints. SIGNS AND SYMPTOMS   Pain, swelling, and stiffness in the joint.  Over time, the joint may lose its normal shape.  Small deposits of bone (osteophytes) may grow on the edges of the joint.  Bits of bone or cartilage can break off  and float inside the joint space. This may cause more pain and damage. DIAGNOSIS  Your health care provider will do a physical exam and ask about your symptoms. Various tests may be ordered, such as:  X-rays of the affected joint.  An MRI scan.  Blood tests to rule out other types of arthritis.  Joint fluid tests. This involves using a needle to draw fluid from the joint and examining the fluid under a microscope. TREATMENT  Goals of treatment are to control pain and improve joint function. Treatment plans may include:  A prescribed exercise program that allows for rest and joint relief.  A weight control plan.  Pain relief techniques, such as:  Properly applied heat and cold.  Electric pulses delivered to nerve endings under the skin (transcutaneous electrical nerve stimulation, TENS).  Massage.  Certain nutritional supplements.  Medicines to control pain, such as:  Acetaminophen.  Nonsteroidal anti-inflammatory drugs (NSAIDs), such as naproxen.  Narcotic or central-acting agents, such as tramadol.  Corticosteroids. These can be given orally or as an injection.  Surgery to reposition the bones and relieve pain (osteotomy) or to remove loose pieces of bone and cartilage. Joint replacement may be needed in advanced states of osteoarthritis. HOME CARE INSTRUCTIONS   Only take over-the-counter or prescription medicines as directed by your health care provider. Take all medicines exactly as instructed.  Maintain a healthy weight. Follow your health care provider's instructions for weight control. This may include dietary instructions.  Exercise as directed. Your  health care provider can recommend specific types of exercise. These may include:  Strengthening exercises These are done to strengthen the muscles that support joints affected by arthritis. They can be performed with weights or with exercise bands to add resistance.  Aerobic activities These are exercises, such  as brisk walking or low-impact aerobics, that get your heart pumping.  Range-of-motion activities These keep your joints limber.  Balance and agility exercises These help you maintain daily living skills.  Rest your affected joints as directed by your health care provider.  Follow up with your health care provider as directed. SEEK MEDICAL CARE IF:   Your skin turns red.  You develop a rash in addition to your joint pain.  You have worsening joint pain. SEEK IMMEDIATE MEDICAL CARE IF:  You have a significant loss of weight or appetite.  You have a fever along with joint or muscle aches.  You have night sweats. [place] of Arthritis and Musculoskeletal and Skin Diseases: www.niams.SouthExposed.es [employer] on Aging: http://kim-miller.com/ American College of Rheumatology: www.rheumatology.org Document Released: 07/16/2005 Document Revised: 05/06/2013 Document Reviewed: 03/23/2013 [facility] Patient Information 2014 [place], [place].        Cervical Radiculopathy Cervical radiculopathy happens when a nerve in the neck is pinched or bruised by a slipped (herniated) disk or by arthritic changes in the bones of the cervical spine. This can occur due to an injury or as part of the normal aging process. Pressure on the cervical nerves can cause pain or numbness that runs from your neck all the way down into your arm and fingers. CAUSES  There are many possible causes, including:  Injury.  Muscle tightness in the neck from overuse.  Swollen, painful joints (arthritis).  Breakdown or degeneration in the bones and joints of the spine (spondylosis) due to aging.  Bone spurs that may develop near the cervical nerves. SYMPTOMS  Symptoms include pain, weakness, or numbness in the affected arm and hand. Pain can be severe or irritating. Symptoms may be worse when extending or turning the neck. DIAGNOSIS  Your caregiver will ask about your symptoms and do a  physical exam. He or she may test your strength and reflexes. X-rays, CT scans, and MRI scans may be needed in cases of injury or if the symptoms do not go away after a period of time. Electromyography (EMG) or nerve conduction testing may be done to study how your nerves and muscles are working. TREATMENT  Your caregiver may recommend certain exercises to help relieve your symptoms. Cervical radiculopathy can, and often does, get better with time and treatment. If your problems continue, treatment options may include:  Wearing a soft collar for short periods of time.  Physical therapy to strengthen the neck muscles.  Medicines, such as nonsteroidal anti-inflammatory drugs (NSAIDs), oral corticosteroids, or spinal injections.  Surgery. Different types of surgery may be done depending on the cause of your problems. HOME CARE INSTRUCTIONS   Put ice on the affected area.  Put ice in a plastic bag.  Place a towel between your skin and the bag.  Leave the ice on for 15-20 minutes, 03-04 times a day or as directed by your caregiver.  If ice does not help, you can try using heat. Take a warm shower or bath, or use a hot water bottle as directed by your caregiver.  You may try a gentle neck and shoulder massage.  Use a flat pillow when you sleep.  Only take over-the-counter or  prescription medicines for pain, discomfort, or fever as directed by your caregiver.  If physical therapy was prescribed, follow your caregiver's directions.  If a soft collar was prescribed, use it as directed. SEEK IMMEDIATE MEDICAL CARE IF:   Your pain gets much worse and cannot be controlled with medicines.  You have weakness or numbness in your hand, arm, face, or leg.  You have a high fever or a stiff, rigid neck.  You lose bowel or bladder control (incontinence).  You have trouble with walking, balance, or speaking. MAKE SURE YOU:   Understand these instructions.  Will watch your condition.  Will  get help right away if you are not doing well or get worse. Document Released: 04/10/2001 Document Revised: 10/08/2011 Document Reviewed: 02/27/2011 [facility] Patient Information 2014 [place], [place].  Back Exercises Back exercises help treat and prevent back injuries. The goal of back exercises is to increase the strength of your abdominal and back muscles and the flexibility of your back. These exercises should be started when you no longer have back pain. Back exercises include:  Pelvic Tilt. Lie on your back with your knees bent. Tilt your pelvis until the lower part of your back is against the floor. Hold this position 5 to 10 sec and repeat 5 to 10 times.  Knee to Chest. Pull first 1 knee up against your chest and hold for 20 to 30 seconds, repeat this with the other knee, and then both knees. This may be done with the other leg straight or bent, whichever feels better.  Sit-Ups or Curl-Ups. Bend your knees 90 degrees. Start with tilting your pelvis, and do a partial, slow sit-up, lifting your trunk only 30 to 45 degrees off the floor. Take at least 2 to 3 seconds for each sit-up. Do not do sit-ups with your knees out straight. If partial sit-ups are difficult, simply do the above but with only tightening your abdominal muscles and holding it as directed.  Hip-Lift. Lie on your back with your knees flexed 90 degrees. Push down with your feet and shoulders as you raise your hips a couple inches off the floor; hold for 10 seconds, repeat 5 to 10 times.  Shoulder-Lifts. Lie face down with arms beside your body. Keep hips and torso pressed to floor as you slowly lift your head and shoulders off the floor. Do not overdo your exercises, especially in the beginning. Exercises may cause you some mild back discomfort which lasts for a few minutes; however, if the pain is more severe, or lasts for more than 15 minutes, do not continue exercises until you see your caregiver. Improvement with exercise  therapy for back problems is slow.  See your caregivers for assistance with developing a proper back exercise program. Document Released: 08/23/2004 Document Revised: 10/08/2011 Document Reviewed: 05/17/2011 [facility] Patient Information 2014 [place].

## 2013-08-28 NOTE — Progress Notes (Signed)
Subjective:    [name] is here for follow up of elevated cholesterol. Compliance with treatment has been excellent. The patient exercises several times per week and has made diet modifications, cutting back on sugar & Woodle flour. She has lost 14 pounds since last visit. Her overall cholesterol panel has improved: triglycerides have settled into the upper 100's from upper 500's; HDL has increased into 40's from 30's, LDL is 160. I performed hs-CRP to further evaluate her risk for CVD, it is in nml range. Her 10 yr risk of developing ASCVD is 2.77%. She does have family Hx of premature heart disease:father had MI at age 57, but lived into 39's w/no further events.  Vit D level has dropped too low on reduced dose - in 30's from 70's. She c/o of neck pain when turns head to R & has occasional numbness at R wrist. Continues to have ongoing back pain. Continues to have trouble sleeping without ambien. Is trying tart cherry juice to help w/sleep-read in magazine.  The following portions of the patient's history were reviewed and updated as appropriate: allergies, current medications, past family history, past medical history, past social history and problem list.  Review of Systems Constitutional: positive for intentional weight loss of 14 pounds, negative for chills, fatigue, fevers and night sweats Respiratory: negative Cardiovascular: negative for chest pain, chest pressure/discomfort, fatigue, irregular heart beat, lower extremity edema and near-syncope Musculoskeletal:positive for back pain and neck pain Neurological: positive for paresthesia Behavioral/Psych: positive for sleep disturbance, negative for excessive alcohol consumption, illegal drug usage and tobacco use    Objective:    BP 142/85  Pulse 83  Temp(Src) 98.5 F (36.9 C) (Temporal)  Ht 5\' 4"  (1.626 m)  Wt 150 lb 4 oz (68.153 kg)  BMI 25.78 kg/m2  SpO2 97% General appearance: alert, cooperative, appears stated age and no  distress Head: Normocephalic, without obvious abnormality, atraumatic Eyes: negative findings: lids and lashes normal and conjunctivae and sclerae normal Neck: no carotid bruit, supple, symmetrical, trachea midline and thyroid not enlarged, symmetric, no tenderness/mass/nodules Back: range of motion normal, mild kyphosis. no point tenderness over spine. Has some R muscular tenderness in traps adjacent to low cervical spine Lungs: clear to auscultation bilaterally Heart: regular rate and rhythm, S1, S2 normal, no murmur, click, rub or gallop Extremities: extremities normal, atraumatic, no cyanosis or edema  Lab Review Lab Results  Component Value Date   CHOL 240* 08/21/2013   CHOL 191 03/18/2013   CHOL 229* 02/10/2013   HDL 45.20 08/21/2013   HDL 40.70 03/18/2013   HDL 41.10 02/10/2013   LDLDIRECT 163.9 08/21/2013   LDLDIRECT 156.5 02/10/2013   LDLDIRECT 105.7 01/26/2013      Assessment:    Dyslipidemia under good control. 10 yr. Risk is low (2.77%) for developing ASCVD   Vit D deficiency, level dropped to 30's from 70 taking 2000 iu qod. Cervicalgia. May have radiation to R forearm/wrist.  Plan:    1. Continue dietary measures & exercise.  Will not start statin. Adv start mega red krill oil-2 capsules qd. 2. Increase Vit D 3 to 2000 iu qd 3 neck stretches. Natural anti-inflmms as discussed, NSAIDS. If no improvemt will order c-s films.

## 2013-09-29 ENCOUNTER — Ambulatory Visit (INDEPENDENT_AMBULATORY_CARE_PROVIDER_SITE_OTHER): Payer: BC Managed Care – PPO | Admitting: Nurse Practitioner

## 2013-09-29 ENCOUNTER — Encounter: Payer: Self-pay | Admitting: Nurse Practitioner

## 2013-09-29 VITALS — BP 120/80 | HR 69 | Temp 98.0°F | Ht 64.0 in | Wt 151.0 lb

## 2013-09-29 DIAGNOSIS — N8110 Cystocele, unspecified: Secondary | ICD-10-CM

## 2013-09-29 DIAGNOSIS — N8111 Cystocele, midline: Secondary | ICD-10-CM

## 2013-09-29 DIAGNOSIS — M542 Cervicalgia: Secondary | ICD-10-CM

## 2013-09-29 NOTE — Patient Instructions (Signed)
You should hear from Dr Elza Rafter office within 10 days. Please let us know if you have not been able to schedule appointment. Please do shoulder stretches at least twice daily. Let me know if you want to see physical therapy.  Nice to see you!

## 2013-09-29 NOTE — Progress Notes (Signed)
Pre visit review using our clinic review tool, if applicable. No additional management support is needed unless otherwise documented below in the visit note. 

## 2013-10-01 ENCOUNTER — Telehealth: Payer: Self-pay | Admitting: Obstetrics and Gynecology

## 2013-10-01 NOTE — Progress Notes (Signed)
Subjective:    [name] is a 53 y.o. female who presents for evaluation of a cystocele. Problem started a few weeks ago. Symptoms include: discomfort: moderate and post-void dribble, feels & sees "ball-like " structure in vagina.. Symptoms have progressed to a point and plateaued. In addition, she continues to have neck discomfort w/rotation of head. She wishes to continue with exercises.  Menstrual History: OB History   Grav Para Term Preterm Abortions TAB SAB Ect Mult Living   2 2        2      Obstetric Comments   Menarche 53 yo.       No LMP recorded. Patient has had a hysterectomy.    The following portions of the patient's history were reviewed and updated as appropriate: allergies, current medications, past family history, past medical history, past social history, past surgical history and problem list.  Review of Systems Constitutional: negative for chills, fatigue, fevers and night sweats Genitourinary:positive for feels & sees "ball" in vagina and has post-void dribble, negative for decreased stream, dysuria, frequency, hematuria, hesitancy and nocturia Musculoskeletal:positive for decreased ROM neck   Objective:     BP 120/80  Pulse 69  Temp(Src) 98 F (36.7 C) (Oral)  Ht 5\' 4"  (1.626 m)  Wt 151 lb (68.493 kg)  BMI 25.91 kg/m2  SpO2 94% Pelvis:  Exam deferred. pt has had hysterectomy, symptoms c/w cystocele      Assessment:    The patient has a cystocele  Pt declined xrays & PT for cervicalgia. Will consider in future if home exercises not helpful. Plan:    referral to gynecology for eval & treatment  Shoulder & neck exercises given.

## 2013-10-01 NOTE — Telephone Encounter (Signed)
LMTCB and confirm appointment with Dr. Quincy Simmonds for 10/09/13 at 7:00 AM.

## 2013-10-01 NOTE — Assessment & Plan Note (Signed)
Shoulder & neck exercises given. Will consider xrays of c-spine & PT in future if no improvement.

## 2013-10-09 ENCOUNTER — Encounter: Payer: Self-pay | Admitting: Obstetrics and Gynecology

## 2013-10-09 ENCOUNTER — Ambulatory Visit (INDEPENDENT_AMBULATORY_CARE_PROVIDER_SITE_OTHER): Payer: BC Managed Care – PPO | Admitting: Obstetrics and Gynecology

## 2013-10-09 VITALS — BP 126/70 | HR 80 | Resp 16 | Ht 64.0 in | Wt 154.0 lb

## 2013-10-09 DIAGNOSIS — R32 Unspecified urinary incontinence: Secondary | ICD-10-CM

## 2013-10-09 DIAGNOSIS — N993 Prolapse of vaginal vault after hysterectomy: Secondary | ICD-10-CM

## 2013-10-09 LAB — POCT URINALYSIS DIPSTICK
BILIRUBIN UA: NEGATIVE
Glucose, UA: NEGATIVE
Ketones, UA: NEGATIVE
Leukocytes, UA: NEGATIVE
Nitrite, UA: NEGATIVE
Protein, UA: NEGATIVE
RBC UA: NEGATIVE
UROBILINOGEN UA: NEGATIVE
pH, UA: 5

## 2013-10-09 NOTE — Patient Instructions (Signed)
Urinary Incontinence Urinary incontinence is the involuntary loss of urine from your bladder. CAUSES  There are many causes of urinary incontinence. They include:  Medicines.  Infections.  Prostatic enlargement, leading to overflow of urine from your bladder.  Surgery.  Neurological diseases.  Emotional factors. SIGNS AND SYMPTOMS Urinary Incontinence can be divided into four types: 1. Urge incontinence. Urge incontinence is the involuntary loss of urine before you have the opportunity to go to the bathroom. There is a sudden urge to void but not enough time to reach a bathroom. 2. Stress incontinence. Stress incontinence is the sudden loss of urine with any activity that forces urine to pass. It is commonly caused by anatomical changes to the pelvis and sphincter areas of your body. 3. Overflow incontinence. Overflow incontinence is the loss of urine from an obstructed opening to your bladder. This results in a backup of urine and a resultant buildup of pressure within the bladder. When the pressure within the bladder exceeds the closing pressure of the sphincter, the urine overflows, which causes incontinence, similar to water overflowing a dam. 4. Total incontinence. Total incontinence is the loss of urine as a result of the inability to store urine within your bladder. DIAGNOSIS  Evaluating the cause of incontinence may require:  A thorough and complete medical and obstetric history.  A complete physical exam.  Laboratory tests such as a urine culture and sensitivities. When additional tests are indicated, they can include:  An ultrasound exam.  Kidney and bladder X-rays.  Cystoscopy. This is an exam of the bladder using a narrow scope.  Urodynamic testing to test the nerve function to the bladder and sphincter areas. TREATMENT  Treatment for urinary incontinence depends on the cause:  For urge incontinence caused by a bacterial infection, antibiotics will be prescribed.  If the urge incontinence is related to medicines you take, your health care provider may have you change the medicine.  For stress incontinence, surgery to re-establish anatomical support to the bladder or sphincter, or both, will often correct the condition.  For overflow incontinence caused by an enlarged prostate, an operation to open the channel through the enlarged prostate will allow the flow of urine out of the bladder. In women with fibroids, a hysterectomy may be recommended.  For total incontinence, surgery on your urinary sphincter may help. An artificial urinary sphincter (an inflatable cuff placed around the urethra) may be required. In women who have developed a hole-like passage between their bladder and vagina (vesicovaginal fistula), surgery to close the fistula often is required. HOME CARE INSTRUCTIONS  Normal daily hygiene and the use of pads or adult diapers that are changed regularly will help prevent odors and skin damage.  Avoid caffeine. It can overstimulate your bladder.  Use the bathroom regularly. Try about every 2 3 hours to go to the bathroom, even if you do not feel the need to do so. Take time to empty your bladder completely. After urinating, wait a minute. Then try to urinate again.  For causes involving nerve dysfunction, keep a log of the medicines you take and a journal of the times you go to the bathroom. SEEK MEDICAL CARE IF:  You experience worsening of pain instead of improvement in pain after your procedure.  Your incontinence becomes worse instead of better. SEE IMMEDIATE MEDICAL CARE IF:  You experience fever or shaking chills.  You are unable to pass your urine.  You have redness spreading into your groin or down into your thighs. MAKE   SURE YOU:   Understand these instructions.   Will watch your condition.  Will get help right away if you are not doing well or get worse. Document Released: 08/23/2004 Document Revised: 05/06/2013 Document  Reviewed: 12/23/2012 [facility] Patient Information 2014 [place].  Cystocele Repair Cystocele repair is surgery to remove a cystocele, which is a bulging, drooping area (hernia) of the bladder that extends into the vagina. This bulging occurs on the top front wall of the vagina. LET [facility] CARE PROVIDER KNOW ABOUT:   Any allergies you have.  All medicines you are taking, including vitamins, herbs, eye drops, creams, and over-the-counter medicines.  Use of steroids (by mouth or creams).  Previous problems you or members of your family have had with the use of anesthetics.  Any blood disorders you have.  Previous surgeries you have had.  Medical conditions you have.  Possibility of pregnancy, if this applies. RISKS AND COMPLICATIONS  Generally, this is a safe procedure. However, as with any procedure, complications can occur. Possible complications include:  Excessive bleeding.  Infection.  Injury to surrounding structures.  Problems related to anesthetics. The risks will vary depending on the type of anesthetic given.  Problems with the urinary catheter after surgery, such as blockage.  Return of the cystocele. BEFORE THE PROCEDURE   Ask your health care provider about changing or stopping your regular medicines. You may need to stop taking certain medicines 1 week before surgery.  Do not eat or drink anything after midnight the night before surgery.  If you smoke, do not smoke for at least 2 weeks before the surgery.  Do not drink any alcohol for 3 days before the surgery.  Arrange for someone to drive you home after your hospital stay and to help you with activities during recovery. PROCEDURE   You will be given a medicine that makes you sleep through the procedure (general anesthetic) or a medicine injected into your spine that numbs your body below the waist (spinal or epidural anesthetic). You will be asleep or be numbed through the entire  procedure.  A thin, flexible tube (Foley catheter) will be placed in your bladder to drain urine during and after the surgery.  The surgery is performed through the vagina. The front wall of the vagina is opened up, and the muscle between the bladder and vagina is pulled up to its normal position. This is reinforced with stitches or a piece of mesh. This removes the hernia so that the top of the vagina does not fall into the opening of the vagina.  The cut on the front wall of the vagina is then closed with absorbable stitches that do not need to be removed. AFTER THE PROCEDURE   You will be taken to a recovery area where your progress will be closely watched. Your breathing, blood pressure, and pulse (vital signs) will be checked often. When you are stable, you will be taken to a regular hospital room.  You will have a catheter in place to drain your bladder. This will stay in place for 2 7 days or until your bladder is working properly on its own.  You may have gauze packing in the vagina. This will be removed 1 2 days after the surgery.  You will be given pain medicine as needed and may be given a medicine that kills germs (antibiotic).  You will likely need to stay in the hospital for 1 2 days. Document Released: 07/13/2000 Document Revised: 03/18/2013 Document Reviewed: 01/02/2013  [employer] Patient Information 2014 [place].

## 2013-10-09 NOTE — Progress Notes (Signed)
Patient ID: [name], female   DOB: 1961/07/23, 53 y.o.   MRN: [id number]  "[name]" GYNECOLOGY VISIT  PCP:   Janice Rake, MD ([place] [facility], [place])  Referring provider:   HPI: 53 y.o.   Married  Caucasian  female   G2P2 with No LMP recorded. Patient has had a hysterectomy.   here for   Urinary incontinence for 6 months.  Wants surgery.  No recent health changes.  Can feel bladder prolapse - can see and feel it for 2 months.  No leak with cough, laugh, or sneeze.  Used to but does not now. Not emptying completely, has to double void to empty completely. Has urgency and frequency.  No urge incontinence. When wash hands in warm, can have some urge.  DF - q 1 hour. NF - 1 - 2 times. No enuresis.  No dysuria or hematuria. Remote history of UTIs. No pyelonephritis.  No nephrolithiasis.   Status post TVH. No prior bladder surgery. No prior bladder evaluation.   No constipation or fecal incontinence.   Hgb:    PCP Urine:  Neg  GYNECOLOGIC HISTORY: No LMP recorded. Patient has had a hysterectomy. Sexually active:  yes Partner preference: female Contraception:  hysterectomy  Menopausal hormone therapy: no DES exposure:   no Blood transfusions:   no Sexually transmitted diseases:   no GYN procedures and prior surgeries:  Total vaginal hysterectomy 2012 for uterine prolapse--ovaries remain Last mammogram:  05/2013 wnl:in [place]              Last pap and high risk HPV testing:  2012 wnl:in [place]--unsure of HPV testing  History of abnormal pap smear:  no   OB History   Grav Para Term Preterm Abortions TAB SAB Ect Mult Living   2 2        2      Obstetric Comments   Menarche 53 yo.       LIFESTYLE: Exercise:   walking            Tobacco:    no Alcohol:      no Drug use:  no  Patient Active Problem List   Diagnosis Date Noted  . Cervicalgia 08/28/2013  . Hypertriglyceridemia 08/28/2013  . Unspecified vitamin D deficiency 08/28/2013  . Recurrent low  back pain 01/20/2013  . Insomnia 01/19/2013  . Family history of premature coronary artery disease 01/19/2013    Past Medical History  Diagnosis Date  . Insomnia     started ambien 5 ya.  . Diverticulosis of colon   . History of rectocele   . Periodic heart flutter   . Urinary incontinence     Past Surgical History  Procedure Laterality Date  . Vaginal hysterectomy  11/11     Vag cuff, mild rectocele    Current Outpatient Prescriptions  Medication Sig Dispense Refill  . zolpidem (AMBIEN) 10 MG tablet Take 1 tablet (10 mg total) by mouth at bedtime as needed for sleep.  30 tablet  2   No current facility-administered medications for this visit.     ALLERGIES: Amoxicillin and Sulfa antibiotics  Family History  Problem Relation Age of Onset  . Arthritis Mother   . Hypertension Mother   . Hyperlipidemia Mother   . Hypertension Father   . Hyperlipidemia Father   . Heart disease Father 74    smoked for 10 yrs., no muscle damage, 1 MI   . Pulmonary fibrosis Father     diagnosed end stage,  cause unkn.  Marland Kitchen Hyperlipidemia Brother   . Diabetes Son   . Asthma Son   . Seizures Son   . Arthritis Maternal Grandmother   . Arthritis Paternal Grandmother   . 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  History   Social History  . Marital Status: Married    Spouse Name: [name]    Number of Children: 2  . Years of Education: 4   Occupational History  . teacher, reading specialist k-6    Social History Main Topics  . Smoking status: Never Smoker   . Smokeless tobacco: Never Used  . Alcohol Use: No  . Drug Use: No  . Sexual Activity: Yes    Partners: Male    Birth Control/ Protection: Surgical     Comment: hysterectomy--still has ovaries   Other Topics Concern  . Not on file   Social History Narrative   Relocated to [facility] from [place]. Due to husband job transfer. 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 She lives with her husband. She has 2 grown sons- oldest recently graduated from college and took a job in [place].; youngest just graduated HS & ia attending [employer].      Has had regular preventive care in [place]. At [facility] w/Dr. Alfonse Spruce. Only health concern is insomnia that developed about 5 years ago & has been treated with [place]. She has not practiced sleep hygiene.    ROS:  Pertinent items are noted in HPI.  PHYSICAL EXAMINATION:    BP 126/70  Pulse 80  Resp 16  Ht 5\' 4"  (1.626 m)  Wt 154 lb (69.854 kg)  BMI 26.42 kg/m2   Wt Readings from Last 3 Encounters:  10/09/13 154 lb (69.854 kg)  09/29/13 151 lb (68.493 kg)  08/28/13 150 lb 4 oz (68.153 kg)     Ht Readings from Last 3 Encounters:  10/09/13 5\' 4"  (1.626 m)  09/29/13 5\' 4"  (1.626 m)  08/28/13 5\' 4"  (1.626 m)    General appearance: alert, cooperative and appears stated age Head: Normocephalic, without obvious abnormality, atraumatic Lungs: clear to auscultation bilaterally Heart: regular rate and rhythm Abdomen: soft, non-tender; no masses,  no organomegaly No abnormal inguinal nodes palpated Neurologic: Grossly normal  Pelvic: External genitalia:  no lesions              Urethra:  normal appearing urethra with no masses, tenderness or lesions              Bartholins and Skenes: normal                 Vagina: normal appearing vagina with normal color and discharge, no lesions Second degree cystocele with valsalva, first degree rectocele              Cervix:  absent                 Bimanual Exam:  Uterus:   absent                                      Adnexa:  no masses  Rectovaginal: Confirms                                      Anus:  normal sphincter tone, no lesions  ASSESSMENT  Post hysterectomy vaginal prolapse Mixed incontinence  PLAN  I have had a comprehensive discussion with the patient regarding prolapse and urinary  incontinence.  I have provided reading materials regarding prolapse and incontinence in general as well as medical and surgical treatment for these conditions. Medical treatments may include pessary use, anticholinergic/antimuscarinic therapy.   We discussed  route of approach to surgery     - vaginal approach with anterior and posterior colporrhaphy and TVT midurethral sling and cystoscopy.  I mentioned use of biologic graft material to add to the strength of a repair, but I am not recommending it at this time.   We discussed benefits and risks of surgery which include but are not limited to bleeding, infection, damage to surrounding organs, ureteral damage, vaginal pain with intercourse, mesh erosion and exposure, dyspareunia, urinary retention and need for prolonged catheterization and/or self catheterization, reoperation, recurrence of prolapse and incontinence,  DVT, PE, death, and reaction to anesthesia.    I have discussed surgical expectations regarding the procedures and success rates, outcomes, and recovery.     Patient would like to proceed wit urodynamic testing and scheduling of surgery.  An After Visit Summary was printed and given to the patient.  45 minutes face to face time of which over 50% was spent in counseling.

## 2013-10-12 ENCOUNTER — Ambulatory Visit (INDEPENDENT_AMBULATORY_CARE_PROVIDER_SITE_OTHER): Payer: BC Managed Care – PPO | Admitting: *Deleted

## 2013-10-12 ENCOUNTER — Telehealth: Payer: Self-pay | Admitting: Obstetrics and Gynecology

## 2013-10-12 VITALS — BP 146/88 | HR 75 | Resp 16 | Ht 64.0 in | Wt 153.0 lb

## 2013-10-12 DIAGNOSIS — R319 Hematuria, unspecified: Secondary | ICD-10-CM

## 2013-10-12 DIAGNOSIS — R32 Unspecified urinary incontinence: Secondary | ICD-10-CM

## 2013-10-12 LAB — POCT URINALYSIS DIPSTICK
Leukocytes, UA: NEGATIVE
Urobilinogen, UA: NEGATIVE
pH, UA: 5

## 2013-10-12 NOTE — Telephone Encounter (Signed)
Spoke with patient. Advised that per insurance quote received, patient liability for urodynamics test is a $40 copay.  Scheduled urinalysis for day and urodynamics for Wed, March 18. Advised patient that if anything is found in her urine that the urodynamics would be cancelled. Patient agreeable.

## 2013-10-12 NOTE — Progress Notes (Signed)
Urinalysis dipped + for RBC's (Trace) patient denies any sx  Per Dr. Quincy Simmonds send urine for culture/micro and we will contact patient with the results to make sure she can still come 10/14/13 for Urodynamics. Patient notified and aware that someone will contact her as soon as results are in.  Urine Sent off for micro/culture.  Routed to provider, encounter closed.

## 2013-10-13 LAB — URINALYSIS, MICROSCOPIC ONLY
Bacteria, UA: NONE SEEN
Casts: NONE SEEN
Crystals: NONE SEEN
SQUAMOUS EPITHELIAL / LPF: NONE SEEN

## 2013-10-13 LAB — URINE CULTURE
Colony Count: NO GROWTH
Organism ID, Bacteria: NO GROWTH

## 2013-10-13 NOTE — Progress Notes (Signed)
Encounter addendum -  Encounter reviewed by Dr. Josefa Half.

## 2013-10-14 ENCOUNTER — Ambulatory Visit (INDEPENDENT_AMBULATORY_CARE_PROVIDER_SITE_OTHER): Payer: BC Managed Care – PPO | Admitting: Obstetrics and Gynecology

## 2013-10-14 DIAGNOSIS — N993 Prolapse of vaginal vault after hysterectomy: Secondary | ICD-10-CM

## 2013-10-14 DIAGNOSIS — R32 Unspecified urinary incontinence: Secondary | ICD-10-CM

## 2013-10-16 ENCOUNTER — Telehealth: Payer: Self-pay | Admitting: *Deleted

## 2013-10-16 NOTE — Telephone Encounter (Signed)
See next phone note for surgical info.  Routing to provider for final review. Patient agreeable to disposition. Will close encounter

## 2013-10-16 NOTE — Progress Notes (Signed)
Urodynamics testing completed, see separate report.

## 2013-10-16 NOTE — Telephone Encounter (Signed)
Patient returned call, see previous message.  Called her back to advise of surgery date and patient is pleased with this date. She is aware this is pending consult with Dr Quincy Simmonds and can be amended for date or procedure as needed.  Will discuss further after consult with Dr Quincy Simmonds.  Routing to provider for final review. Patient agreeable to disposition. Will close encounter

## 2013-10-16 NOTE — Telephone Encounter (Signed)
Patient states she is returning [name]'s call. She is not aware of what the call is about.

## 2013-10-16 NOTE — Telephone Encounter (Signed)
Call to patient to notify that surgery date has been scheduled for 11-10-13 at 0930 at [facility] pending appointment with Dr Quincy Simmonds to discuss results.  LMTCB, VM has name confirmation

## 2013-10-19 ENCOUNTER — Encounter: Payer: Self-pay | Admitting: Obstetrics and Gynecology

## 2013-10-19 ENCOUNTER — Ambulatory Visit (INDEPENDENT_AMBULATORY_CARE_PROVIDER_SITE_OTHER): Payer: BC Managed Care – PPO | Admitting: Obstetrics and Gynecology

## 2013-10-19 VITALS — BP 118/78 | HR 68 | Ht 64.0 in | Wt 153.0 lb

## 2013-10-19 DIAGNOSIS — N8111 Cystocele, midline: Secondary | ICD-10-CM

## 2013-10-19 DIAGNOSIS — N393 Stress incontinence (female) (male): Secondary | ICD-10-CM

## 2013-10-19 DIAGNOSIS — IMO0002 Reserved for concepts with insufficient information to code with codable children: Secondary | ICD-10-CM

## 2013-10-19 DIAGNOSIS — N816 Rectocele: Secondary | ICD-10-CM

## 2013-10-19 NOTE — Progress Notes (Signed)
Surgery date of Tuesday 11-11-13 at [facility] at 0930 given to patient.  Surgery instructions reviewed and instruction sheet given.  Pre/post op appointments scheduled and she will send FMLA forms for completion. Brief discussion of post op restrictions discussed.

## 2013-10-20 ENCOUNTER — Encounter: Payer: Self-pay | Admitting: Obstetrics and Gynecology

## 2013-10-21 ENCOUNTER — Encounter: Payer: Self-pay | Admitting: Obstetrics and Gynecology

## 2013-10-22 ENCOUNTER — Encounter: Payer: Self-pay | Admitting: Obstetrics and Gynecology

## 2013-10-22 NOTE — Progress Notes (Signed)
Encounter and urodynamic reports reviewed by Dr. Josefa Half.

## 2013-10-22 NOTE — Progress Notes (Signed)
GYNECOLOGY  VISIT   HPI: 53 y.o.   Married  Caucasian  female   G2P2 with No LMP recorded. Patient has had a hysterectomy.   here for discussion of urodynamic testing. Patient desires to proceed with surgery.  Uroflow - Voided 73 cc, PVR 10 cc, continuous.  CMG - S1 130 cc, S2 286 cc, max capacity 410 cc.  VLPP 62 cm H20.  UCP - 15 cm H20.  Pressure flow - voided 583 cc.  Pdet Max 29 cm H20.  GYNECOLOGIC HISTORY: No LMP recorded. Patient has had a hysterectomy.          OB History   Grav Para Term Preterm Abortions TAB SAB Ect Mult Living   2 2        2      Obstetric Comments   Menarche 53 yo.         Patient Active Problem List   Diagnosis Date Noted  . Cervicalgia 08/28/2013  . Hypertriglyceridemia 08/28/2013  . Unspecified vitamin D deficiency 08/28/2013  . Recurrent low back pain 01/20/2013  . Insomnia 01/19/2013  . Family history of premature coronary artery disease 01/19/2013    Past Medical History  Diagnosis Date  . Insomnia     started ambien 5 ya.  . Diverticulosis of colon   . History of rectocele   . Periodic heart flutter   . Urinary incontinence     Past Surgical History  Procedure Laterality Date  . Vaginal hysterectomy  11/11     Vag cuff, mild rectocele    Current Outpatient Prescriptions  Medication Sig Dispense Refill  . zolpidem (AMBIEN) 10 MG tablet Take 1 tablet (10 mg total) by mouth at bedtime as needed for sleep.  30 tablet  2   No current facility-administered medications for this visit.     ALLERGIES: Amoxicillin and Sulfa antibiotics  Family History  Problem Relation Age of Onset  . Arthritis Mother   . Hypertension Mother   . Hyperlipidemia Mother   . Hypertension Father   . Hyperlipidemia Father   . Heart disease Father 52    smoked for 10 yrs., no muscle damage, 1 MI   . Pulmonary fibrosis Father     diagnosed end stage, cause unkn.  Marland Kitchen Hyperlipidemia Brother   . Diabetes Son   . Asthma Son   . Seizures Son    . Arthritis Maternal Grandmother   . Arthritis Paternal Grandmother   . Asthma Son     History   Social History  . Marital Status: Married    Spouse Name: Phillip Heal    Number of Children: 2  . Years of Education: 4   Occupational History  . teacher, reading specialist k-6    Social History Main Topics  . Smoking status: Never Smoker   . Smokeless tobacco: Never Used  . Alcohol Use: No  . Drug Use: No  . Sexual Activity: Yes    Partners: Male    Birth Control/ Protection: Surgical     Comment: hysterectomy--still has ovaries   Other Topics Concern  . Not on file   Social History Narrative   Relocated to [facility] from [place]. Due to husband job transfer. 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 She lives with her husband. She has 2 grown sons- oldest recently graduated from college and took a job in [place].; youngest just graduated HS & ia  attending [employer].      Has had regular preventive care in [place]. At [facility] w/Dr. Alfonse Spruce. Only health concern is insomnia that developed about 5 years ago & has been treated with [place]. She has not practiced sleep hygiene.    ROS:  Pertinent items are noted in HPI.  PHYSICAL EXAMINATION:    BP 118/78  Pulse 68  Ht 5\' 4"  (1.626 m)  Wt 153 lb (69.4 kg)  BMI 26.25 kg/m2     General appearance: alert, cooperative and appears stated age   ASSESSMENT  Incomplete vaginal prolapse. Genuine stress incontinence.  PLAN  I have had a comprehensive discussion with the patient regarding prolapse and urinary incontinence.  I have provided reading materials from [employer] regarding prolapse and incontinence in general as well as medical and surgical treatment for these conditions. Medical treatments may include physical therapy, pessary use, anticholinergic/antimuscarinic therapy.   We discussed anterior and posterior colporrhaphy with TVT midurethral sling and  cystoscopy, possible sacrospinous fixation.   We discussed benefits and risks of surgery which include but are not limited to bleeding, infection, damage to surrounding organs, ureteral damage, vaginal pain with intercourse, mesh erosion and exposure, dyspareunia, urinary retention and need for prolonged catheterization and/or self catheterization, reoperation, recurrence of prolapse and incontinence,  DVT, PE, death, and reaction to anesthesia.    I have discussed surgical expectations regarding the procedures and success rates, outcomes, and recovery.  Patient wishes to proceed with surgery.    An After Visit Summary was printed and given to the patient.  _25_____ minutes face to face time of which over 50% was spent in counseling.

## 2013-10-26 ENCOUNTER — Telehealth: Payer: Self-pay | Admitting: *Deleted

## 2013-10-26 ENCOUNTER — Ambulatory Visit (INDEPENDENT_AMBULATORY_CARE_PROVIDER_SITE_OTHER): Payer: BC Managed Care – PPO | Admitting: Obstetrics and Gynecology

## 2013-10-26 ENCOUNTER — Encounter: Payer: Self-pay | Admitting: Obstetrics and Gynecology

## 2013-10-26 ENCOUNTER — Encounter: Payer: Self-pay | Admitting: Nurse Practitioner

## 2013-10-26 VITALS — BP 110/70 | HR 70 | Ht 64.0 in | Wt 153.8 lb

## 2013-10-26 DIAGNOSIS — IMO0002 Reserved for concepts with insufficient information to code with codable children: Secondary | ICD-10-CM

## 2013-10-26 DIAGNOSIS — N8111 Cystocele, midline: Secondary | ICD-10-CM

## 2013-10-26 DIAGNOSIS — N816 Rectocele: Secondary | ICD-10-CM

## 2013-10-26 DIAGNOSIS — N393 Stress incontinence (female) (male): Secondary | ICD-10-CM

## 2013-10-26 NOTE — Progress Notes (Signed)
Patient ID: [name], female   DOB: 16-May-1961, 53 y.o.   MRN: [id number] GYNECOLOGY VISIT  PCP:   Keith Rake, MD  Referring provider:   HPI: 53 y.o.   Married  Caucasian  female   G2P2 with No LMP recorded. Patient has had a hysterectomy.   here for   Discussion of surgery for urinary incontinence.  Some vaginal discharge.  No itching or burning.   Once every three or four weeks has a heart fluttering.  Lasts a few seconds. No SOB, chest pain or pressure.    No seen at the hospital for a pre-op visit.   Has hives with amoxicillin.  Has rash with sulfa.   GYNECOLOGIC HISTORY: No LMP recorded. Patient has had a hysterectomy. Sexually active:  yes Partner preference: female Contraception:   hysterectomy Menopausal hormone therapy: no DES exposure: no   Blood transfusions:   no Sexually transmitted diseases:  no  GYN procedures and prior surgeries:  Total Vaginal Hysterectomy 2012 for uterine prolapse--ovaries Remain. Last mammogram:   05/2013 wnl:in [place]              Last pap and high risk HPV testing:  2012 wnl:in [place]:unsure of HPV testing  History of abnormal pap smear:  no   OB History   Grav Para Term Preterm Abortions TAB SAB Ect Mult Living   2 2        2      Obstetric Comments   Menarche 53 yo.       LIFESTYLE: Exercise:  walking             Tobacco: no Alcohol:  no Drug use:  no  Patient Active Problem List   Diagnosis Date Noted  . Cervicalgia 08/28/2013  . Hypertriglyceridemia 08/28/2013  . Unspecified vitamin D deficiency 08/28/2013  . Recurrent low back pain 01/20/2013  . Insomnia 01/19/2013  . Family history of premature coronary artery disease 01/19/2013    Past Medical History  Diagnosis Date  . Insomnia     started ambien 5 ya.  . Diverticulosis of colon   . History of rectocele   . Periodic heart flutter   . Urinary incontinence     Past Surgical History  Procedure Laterality Date  . Vaginal hysterectomy  11/11      Vag cuff, mild rectocele    Current Outpatient Prescriptions  Medication Sig Dispense Refill  . zolpidem (AMBIEN) 10 MG tablet Take 1 tablet (10 mg total) by mouth at bedtime as needed for sleep.  30 tablet  2   No current facility-administered medications for this visit.     ALLERGIES: Amoxicillin and Sulfa antibiotics  Family History  Problem Relation Age of Onset  . Arthritis Mother   . Hypertension Mother   . Hyperlipidemia Mother   . Hypertension Father   . Hyperlipidemia Father   . Heart disease Father 38    smoked for 10 yrs., no muscle damage, 1 MI   . Pulmonary fibrosis Father     diagnosed end stage, cause unkn.  Marland Kitchen Hyperlipidemia Brother   . Diabetes Son   . Asthma Son   . Seizures Son   . Arthritis Maternal Grandmother   . Arthritis Paternal Grandmother   . Asthma Son     History   Social History  . Marital Status: Married    Spouse Name: Phillip Heal    Number of Children: 2  . Years of Education: 4   Occupational History  . teacher,  reading specialist k-6    Social History Main Topics  . Smoking status: Never Smoker   . Smokeless tobacco: Never Used  . Alcohol Use: No  . Drug Use: No  . Sexual Activity: Yes    Partners: Male    Birth Control/ Protection: Surgical     Comment: hysterectomy--still has ovaries   Other Topics Concern  . Not on file   Social History Narrative   Relocated to [facility] from [place]. Due to husband job transfer. 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 She lives with her husband. She has 2 grown sons- oldest recently graduated from college and took a job in [place].; youngest just graduated HS & ia attending [employer].      Has had regular preventive care in [place]. At [facility] w/Dr. Alfonse Spruce. Only health concern is insomnia that developed about 5 years ago & has been treated with [place]. She has not practiced sleep hygiene.    ROS:  Pertinent  items are noted in HPI.  PHYSICAL EXAMINATION:    BP 110/70  Pulse 70  Ht 5\' 4"  (1.626 m)  Wt 153 lb 12.8 oz (69.763 kg)  BMI 26.39 kg/m2   Wt Readings from Last 3 Encounters:  10/26/13 153 lb 12.8 oz (69.763 kg)  10/19/13 153 lb (69.4 kg)  10/16/13 151 lb 9.6 oz (68.765 kg)     Ht Readings from Last 3 Encounters:  10/26/13 5\' 4"  (1.626 m)  10/19/13 5\' 4"  (1.626 m)  10/12/13 5\' 4"  (1.626 m)    General appearance: alert, cooperative and appears stated age Head: Normocephalic, without obvious abnormality, atraumatic Neck: no adenopathy, supple, symmetrical, trachea midline and thyroid not enlarged, symmetric, no tenderness/mass/nodules Lungs: clear to auscultation bilaterally Breasts: Inspection negative, No nipple retraction or dimpling, No nipple discharge or bleeding, No axillary or supraclavicular adenopathy, Normal to palpation without dominant masses Heart: regular rate and rhythm Abdomen: soft, non-tender; no masses,  no organomegaly Extremities: extremities normal, atraumatic, no cyanosis or edema Skin: Skin color, texture, turgor normal. No rashes or lesions Lymph nodes: Cervical, supraclavicular, and axillary nodes normal. No abnormal inguinal nodes palpated Neurologic: Grossly normal  Pelvic: External genitalia:  no lesions              Urethra:  normal appearing urethra with no masses, tenderness or lesions              Bartholins and Skenes: normal                 Vagina: normal appearing vagina with normal color and discharge, no lesions, almost second degree cystocele, second degree rectocele, minimal apical prolapse              Cervix: absent                 Bimanual Exam:  Uterus:   absent                                      Adnexa:  no masses                                      Rectovaginal: Confirms  Anus:  normal sphincter tone, no lesions  ASSESSMENT  Incomplete vaginal prolapse. Genuine stress  incontinence. Heart palpitations.  PLAN  Proceed with anterior and posterior colporrhaphy with native tissue repair, TVT Exact midurethral sling, and cystoscopy, possible sacrospinous fixation.  Risks, benefits, and alternatives discussed with the patient who wishes to proceed. Questions invited and answered. Will proceed with anesthesia consult pre-op.  25 minutes face to face time of which over 50% was spent in counseling.  An After Visit Summary was printed and given to the patient.

## 2013-10-26 NOTE — Telephone Encounter (Signed)
Message copied by Jaymes Graff on Mon Oct 26, 2013  4:36 PM ------      Message from: Allen: Mon Oct 26, 2013  7:27 AM      Regarding: Needs hospital pre-op visit with anesthesia due to heart fluttering       Patient needs to be seen by anesthesia pre-op.      Needs an EKG due to periodic heart fluttering.            Off from school April 6 and 7, if possible.             Thanks,            [place] ------

## 2013-10-26 NOTE — Telephone Encounter (Signed)
LM on PAT VM that Dr Quincy Simmonds requesting anesthesia to see patient at pre-op due to "heart fluttering" and days that patient is out of school next week. LMTCB.

## 2013-10-26 NOTE — Patient Instructions (Signed)
Please call us on April 1 in the morning if you have not yet heard from [place] about your anesthesia appointment.

## 2013-10-29 ENCOUNTER — Encounter (HOSPITAL_COMMUNITY): Payer: Self-pay | Admitting: Pharmacist

## 2013-10-29 NOTE — Telephone Encounter (Signed)
Returning a call to Sally. °

## 2013-10-29 NOTE — Telephone Encounter (Signed)
Return call to patient, LMTCB.  

## 2013-11-02 ENCOUNTER — Ambulatory Visit: Payer: BC Managed Care – PPO | Admitting: Nurse Practitioner

## 2013-11-02 NOTE — Telephone Encounter (Signed)
Pt returning call. If fmla paperwork is ready, pt says she can pick up tomorrow morning since she will be in the area.

## 2013-11-02 NOTE — Patient Instructions (Addendum)
   Your procedure is scheduled on:  Tuesday, May 14  Enter through the Main Entrance of [facility] at: 6 AM Pick up the phone at the desk and dial [phone] and inform us of your arrival.  Please call this number if you have any problems the morning of surgery: [phone]  Remember: Do not eat or drink after midnight: Monday Take these medicines the morning of surgery with a SIP OF WATER:  None  Do not wear jewelry, make-up, or FINGER nail polish No metal in your hair or on your body. Do not wear lotions, powders, perfumes.  You may wear deodorant.  Do not bring valuables to the hospital. Contacts, dentures or bridgework may not be worn into surgery.  Leave suitcase in the car. After Surgery it may be brought to your room. For patients being admitted to the hospital, checkout time is 11:00am the day of discharge.  Home with husband "Phillip Heal" cell [phone].

## 2013-11-03 ENCOUNTER — Encounter (HOSPITAL_COMMUNITY)
Admission: RE | Admit: 2013-11-03 | Discharge: 2013-11-03 | Disposition: A | Discharge status: Home / Self Care | Payer: BC Managed Care – PPO | Source: Ambulatory Visit | Attending: Obstetrics and Gynecology | Admitting: Obstetrics and Gynecology

## 2013-11-03 ENCOUNTER — Encounter (HOSPITAL_COMMUNITY): Payer: Self-pay

## 2013-11-03 DIAGNOSIS — Z01812 Encounter for preprocedural laboratory examination: Secondary | ICD-10-CM | POA: Insufficient documentation

## 2013-11-03 DIAGNOSIS — Z01810 Encounter for preprocedural cardiovascular examination: Secondary | ICD-10-CM | POA: Insufficient documentation

## 2013-11-03 HISTORY — DX: Unspecified osteoarthritis, unspecified site: M19.90

## 2013-11-03 LAB — CBC
HEMATOCRIT: 41.9 % (ref 36.0–46.0)
HEMOGLOBIN: 14.1 g/dL (ref 12.0–15.0)
MCH: 30.7 pg (ref 26.0–34.0)
MCHC: 33.7 g/dL (ref 30.0–36.0)
MCV: 91.3 fL (ref 78.0–100.0)
Platelets: 242 10*3/uL (ref 150–400)
RBC: 4.59 MIL/uL (ref 3.87–5.11)
RDW: 13.3 % (ref 11.5–15.5)
WBC: 6.9 10*3/uL (ref 4.0–10.5)

## 2013-11-04 NOTE — Telephone Encounter (Signed)
Patient notified that forms are completed and will be ready this afternoon pending Dr Elza Rafter review/signature. Patient will be by approximately 5:30 pm to pick up forms.  Routing to provider for final review. Patient agreeable to disposition. Will close encounter

## 2013-11-04 NOTE — Telephone Encounter (Signed)
Patient is calling sally back °

## 2013-11-09 ENCOUNTER — Encounter (HOSPITAL_COMMUNITY): Payer: Self-pay | Admitting: Anesthesiology

## 2013-11-09 MED ORDER — CIPROFLOXACIN IN D5W 400 MG/200ML IV SOLN
400.0000 mg | INTRAVENOUS | Status: AC
  Administered 2013-11-10 (×2): 400 mg via INTRAVENOUS
  Filled 2013-11-09: qty 200

## 2013-11-09 MED ORDER — METRONIDAZOLE IN NACL 5-0.79 MG/ML-% IV SOLN
500.0000 mg | INTRAVENOUS | Status: AC
  Administered 2013-11-10: .5 g via INTRAVENOUS
  Filled 2013-11-09: qty 100

## 2013-11-09 NOTE — Anesthesia Preprocedure Evaluation (Addendum)
Anesthesia Evaluation  Patient identified by MRN, date of birth, ID band Patient awake    Reviewed: Allergy & Precautions, H&P , NPO status , Patient's Chart, lab work & pertinent test results  Airway Mallampati: II TM Distance: >3 FB Neck ROM: Full    Dental no notable dental hx. (+) Teeth Intact   Pulmonary neg pulmonary ROS,  breath sounds clear to auscultation  Pulmonary exam normal       Cardiovascular negative cardio ROS  Rhythm:Regular Rate:Normal     Neuro/Psych negative neurological ROS  negative psych ROS   GI/Hepatic Neg liver ROS, Rectocele   Endo/Other  negative endocrine ROSHypertriglyceridemia  Renal/GU negative Renal ROS Bladder dysfunction  Cystocele Mixed Urinary incontinence    Musculoskeletal  (+) Arthritis -,   Abdominal   Peds  Hematology negative hematology ROS (+)   Anesthesia Other Findings   Reproductive/Obstetrics Vaginal prolapse                          Anesthesia Physical Anesthesia Plan  ASA: II  Anesthesia Plan: General   Post-op Pain Management:    Induction: Intravenous  Airway Management Planned: Oral ETT  Additional Equipment:   Intra-op Plan:   Post-operative Plan: Extubation in OR  Informed Consent: I have reviewed the patients History and Physical, chart, labs and discussed the procedure including the risks, benefits and alternatives for the proposed anesthesia with the patient or authorized representative who has indicated his/her understanding and acceptance.   Dental advisory given  Plan Discussed with: CRNA, Anesthesiologist and Surgeon  Anesthesia Plan Comments:         Anesthesia Quick Evaluation

## 2013-11-09 NOTE — H&P (Signed)
Patient ID: [name], female DOB: [birth year], 53 y.o. MRN: [id number]  GYNECOLOGY VISIT  PCP: Keith Rake, MD  Referring provider:  HPI:  53 y.o. Married Caucasian female  G2P2 with No LMP recorded. Patient has had a hysterectomy.  here for Discussion of surgery for urinary incontinence.  Some vaginal discharge. No itching or burning.  Once every three or four weeks has a heart fluttering. Lasts a few seconds.  No SOB, chest pain or pressure.  Not seen at the hospital for a pre-op visit.  Has hives with amoxicillin.  Has rash with sulfa.  GYNECOLOGIC HISTORY:  No LMP recorded. Patient has had a hysterectomy.  Sexually active: yes  Partner preference: female  Contraception: hysterectomy  Menopausal hormone therapy: no  DES exposure: no  Blood transfusions: no  Sexually transmitted diseases: no  GYN procedures and prior surgeries: Total Vaginal Hysterectomy 2012 for uterine prolapse--ovaries remain.  Last mammogram: 05/2013 wnl:in [place]  Last pap and high risk HPV testing: 2012 wnl:in [place]:unsure of HPV testing  History of abnormal pap smear: no  OB History    Grav  Para  Term  Preterm  Abortions  TAB  SAB  Ect  Mult  Living    2  2         2       Obstetric Comments    Menarche 53 yo.     LIFESTYLE:  Exercise: walking  Tobacco: no  Alcohol: no  Drug use: no  Patient Active Problem List    Diagnosis  Date Noted   .  Cervicalgia  08/28/2013   .  Hypertriglyceridemia  08/28/2013   .  Unspecified vitamin D deficiency  08/28/2013   .  Recurrent low back pain  01/20/2013   .  Insomnia  01/19/2013   .  Family history of premature coronary artery disease  01/19/2013    Past Medical History   Diagnosis  Date   .  Insomnia      started ambien 5 ya.   .  Diverticulosis of colon    .  History of rectocele    .  Periodic heart flutter    .  Urinary incontinence     Past Surgical History   Procedure  Laterality  Date   .  Vaginal hysterectomy   11/11     Vag  cuff, mild rectocele    Current Outpatient Prescriptions   Medication  Sig  Dispense  Refill   .  zolpidem (AMBIEN) 10 MG tablet  Take 1 tablet (10 mg total) by mouth at bedtime as needed for sleep.  30 tablet  2    No current facility-administered medications for this visit.   ALLERGIES: Amoxicillin and Sulfa antibiotics  Family History   Problem  Relation  Age of Onset   .  Arthritis  Mother    .  Hypertension  Mother    .  Hyperlipidemia  Mother    .  Hypertension  Father    .  Hyperlipidemia  Father    .  Heart disease  Father  76     smoked for 10 yrs., no muscle damage, 1 MI   .  Pulmonary fibrosis  Father      diagnosed end stage, cause unkn.   Marland Kitchen  Hyperlipidemia  Brother    .  Diabetes  Son    .  Asthma  Son    .  Seizures  Son    .  Arthritis  Maternal  Grandmother    .  Arthritis  Paternal Grandmother    .  Asthma  Son     History    Social History   .  Marital Status:  Married     Spouse Name:  Phillip Heal     Number of Children:  2   .  Years of Education:  4    Occupational History   .  teacher, reading specialist k-6     Social History Main Topics   .  Smoking status:  Never Smoker   .  Smokeless tobacco:  Never Used   .  Alcohol Use:  No   .  Drug Use:  No   .  Sexual Activity:  Yes     Partners:  Male     Birth Control/ Protection:  Surgical      Comment: hysterectomy--still has ovaries    Other Topics  Concern   .  Not on file    Social History Narrative    Relocated to [facility] from [place]. Due to husband job transfer. 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 She lives with her husband. She has 2 grown sons- oldest recently graduated from college and took a job in [place].; youngest just graduated HS & ia attending [employer].       Has had regular preventive care in [place]. At [facility] w/Dr. Alfonse Spruce. Only health concern is insomnia that developed about 5 years ago & has been  treated with [place]. She has not practiced sleep hygiene.   ROS: Pertinent items are noted in HPI.  PHYSICAL EXAMINATION:  BP 110/70  Pulse 70  Ht 5\' 4"  (1.626 m)  Wt 153 lb 12.8 oz (69.763 kg)  BMI 26.39 kg/m2  Wt Readings from Last 3 Encounters:   10/26/13  153 lb 12.8 oz (69.763 kg)   10/19/13  153 lb (69.4 kg)   10/16/13  151 lb 9.6 oz (68.765 kg)    Ht Readings from Last 3 Encounters:   10/26/13  5\' 4"  (1.626 m)   10/19/13  5\' 4"  (1.626 m)   10/12/13  5\' 4"  (1.626 m)   General appearance: alert, cooperative and appears stated age  Head: Normocephalic, without obvious abnormality, atraumatic  Neck: no adenopathy, supple, symmetrical, trachea midline and thyroid not enlarged, symmetric, no tenderness/mass/nodules  Lungs: clear to auscultation bilaterally  Breasts: Inspection negative, No nipple retraction or dimpling, No nipple discharge or bleeding, No axillary or supraclavicular adenopathy, Normal to palpation without dominant masses  Heart: regular rate and rhythm  Abdomen: soft, non-tender; no masses, no organomegaly  Extremities: extremities normal, atraumatic, no cyanosis or edema  Skin: Skin color, texture, turgor normal. No rashes or lesions  Lymph nodes: Cervical, supraclavicular, and axillary nodes normal.  No abnormal inguinal nodes palpated  Neurologic: Grossly normal  Pelvic: External genitalia: no lesions  Urethra: normal appearing urethra with no masses, tenderness or lesions  Bartholins and Skenes: normal  Vagina: normal appearing vagina with normal color and discharge, no lesions, almost second degree cystocele, second degree rectocele, minimal apical prolapse  Cervix: absent  Bimanual Exam: Uterus: absent  Adnexa: no masses  Rectovaginal: Confirms  Anus: normal sphincter tone, no lesions  ASSESSMENT  Incomplete vaginal prolapse.  Genuine stress incontinence.  Heart palpitations.  PLAN  Proceed with anterior and posterior colporrhaphy with native tissue  repair, TVT Exact midurethral sling, and cystoscopy, possible sacrospinous  fixation. Risks, benefits, and alternatives discussed with the patient who wishes to proceed.  Questions invited and answered.  Will proceed with anesthesia consult pre-op.  25 minutes face to face time of which over 50% was spent in counseling.  An After Visit Summary was printed and given to the patient.

## 2013-11-10 ENCOUNTER — Encounter (HOSPITAL_COMMUNITY): Payer: Self-pay | Admitting: Anesthesiology

## 2013-11-10 ENCOUNTER — Encounter (HOSPITAL_COMMUNITY)
Admission: RE | Disposition: A | Discharge status: Home / Self Care | Payer: Self-pay | Source: Ambulatory Visit | Attending: Obstetrics and Gynecology

## 2013-11-10 ENCOUNTER — Observation Stay (HOSPITAL_COMMUNITY)
Admission: RE | Admit: 2013-11-10 | Discharge: 2013-11-11 | Disposition: A | Discharge status: Home / Self Care | Payer: BC Managed Care – PPO | Source: Ambulatory Visit | Attending: Obstetrics and Gynecology | Admitting: Obstetrics and Gynecology

## 2013-11-10 ENCOUNTER — Ambulatory Visit (HOSPITAL_COMMUNITY): Payer: BC Managed Care – PPO | Admitting: Anesthesiology

## 2013-11-10 ENCOUNTER — Encounter (HOSPITAL_COMMUNITY): Payer: BC Managed Care – PPO | Admitting: Anesthesiology

## 2013-11-10 DIAGNOSIS — Z9889 Other specified postprocedural states: Secondary | ICD-10-CM

## 2013-11-10 DIAGNOSIS — R339 Retention of urine, unspecified: Secondary | ICD-10-CM | POA: Insufficient documentation

## 2013-11-10 DIAGNOSIS — N812 Incomplete uterovaginal prolapse: Secondary | ICD-10-CM

## 2013-11-10 DIAGNOSIS — N393 Stress incontinence (female) (male): Secondary | ICD-10-CM

## 2013-11-10 DIAGNOSIS — M129 Arthropathy, unspecified: Secondary | ICD-10-CM | POA: Insufficient documentation

## 2013-11-10 DIAGNOSIS — N993 Prolapse of vaginal vault after hysterectomy: Principal | ICD-10-CM | POA: Insufficient documentation

## 2013-11-10 DIAGNOSIS — D649 Anemia, unspecified: Secondary | ICD-10-CM | POA: Insufficient documentation

## 2013-11-10 HISTORY — PX: BLADDER SUSPENSION: SHX72

## 2013-11-10 HISTORY — PX: CYSTOSCOPY: SHX5120

## 2013-11-10 LAB — URINALYSIS, ROUTINE W REFLEX MICROSCOPIC
BILIRUBIN URINE: NEGATIVE
Glucose, UA: NEGATIVE mg/dL
KETONES UR: NEGATIVE mg/dL
Leukocytes, UA: NEGATIVE
Nitrite: NEGATIVE
PROTEIN: NEGATIVE mg/dL
Specific Gravity, Urine: 1.025 (ref 1.005–1.030)
UROBILINOGEN UA: 0.2 mg/dL (ref 0.0–1.0)
pH: 6 (ref 5.0–8.0)

## 2013-11-10 LAB — BASIC METABOLIC PANEL
BUN: 13 mg/dL (ref 6–23)
CALCIUM: 9.4 mg/dL (ref 8.4–10.5)
CO2: 28 mEq/L (ref 19–32)
Chloride: 102 mEq/L (ref 96–112)
Creatinine, Ser: 0.75 mg/dL (ref 0.50–1.10)
GFR calc Af Amer: >90 mL/min (ref >90)
GLUCOSE: 90 mg/dL (ref 70–99)
Potassium: 3.9 mEq/L (ref 3.7–5.3)
SODIUM: 141 meq/L (ref 137–147)

## 2013-11-10 LAB — URINE MICROSCOPIC-ADD ON

## 2013-11-10 SURGERY — URETHROPEXY, USING TRANSVAGINAL TAPE
Anesthesia: General | Site: Vagina

## 2013-11-10 MED ORDER — HYDROMORPHONE HCL PF 1 MG/ML IJ SOLN
INTRAMUSCULAR | Status: DC | PRN
  Administered 2013-11-10: 0.5 mg via INTRAVENOUS

## 2013-11-10 MED ORDER — DIPHENHYDRAMINE HCL 12.5 MG/5ML PO ELIX: 12.5000 mg | ORAL_SOLUTION | Freq: Four times a day (QID) | ORAL | Status: DC | PRN

## 2013-11-10 MED ORDER — OXYCODONE-ACETAMINOPHEN 5-325 MG PO TABS: 1.0000 | ORAL_TABLET | ORAL | Status: DC | PRN

## 2013-11-10 MED ORDER — ONDANSETRON HCL 4 MG/2ML IJ SOLN: 4.0000 mg | Freq: Four times a day (QID) | INTRAMUSCULAR | Status: DC | PRN

## 2013-11-10 MED ORDER — LACTATED RINGERS IV SOLN: INTRAVENOUS | Status: DC

## 2013-11-10 MED ORDER — MORPHINE SULFATE (PF) 1 MG/ML IV SOLN
INTRAVENOUS | Status: DC
  Administered 2013-11-10: 2 mL via INTRAVENOUS
  Administered 2013-11-10: 4.99 mg via INTRAVENOUS
  Administered 2013-11-10: 1 mg via INTRAVENOUS
  Administered 2013-11-10: 12:00:00 via INTRAVENOUS
  Administered 2013-11-11: 2 mg via INTRAVENOUS
  Filled 2013-11-10: qty 25

## 2013-11-10 MED ORDER — HYDROMORPHONE HCL PF 1 MG/ML IJ SOLN
INTRAMUSCULAR | Status: AC
  Filled 2013-11-10: qty 1

## 2013-11-10 MED ORDER — DEXAMETHASONE SODIUM PHOSPHATE 10 MG/ML IJ SOLN
INTRAMUSCULAR | Status: DC | PRN
  Administered 2013-11-10: 10 mg via INTRAVENOUS

## 2013-11-10 MED ORDER — EPHEDRINE 5 MG/ML INJ
INTRAVENOUS | Status: AC
  Filled 2013-11-10: qty 10

## 2013-11-10 MED ORDER — ONDANSETRON HCL 4 MG/2ML IJ SOLN
INTRAMUSCULAR | Status: AC
  Filled 2013-11-10: qty 2

## 2013-11-10 MED ORDER — HYDROMORPHONE HCL PF 1 MG/ML IJ SOLN
0.2500 mg | INTRAMUSCULAR | Status: DC | PRN
  Administered 2013-11-10: 0.5 mg via INTRAVENOUS

## 2013-11-10 MED ORDER — EPHEDRINE SULFATE 50 MG/ML IJ SOLN
INTRAMUSCULAR | Status: AC
  Filled 2013-11-10: qty 1

## 2013-11-10 MED ORDER — KETOROLAC TROMETHAMINE 30 MG/ML IJ SOLN
INTRAMUSCULAR | Status: AC
Start: 2013-11-10 — End: 2013-11-10
  Filled 2013-11-10: qty 1

## 2013-11-10 MED ORDER — LIDOCAINE-EPINEPHRINE 1 %-1:100000 IJ SOLN
INTRAMUSCULAR | Status: AC
  Filled 2013-11-10: qty 2

## 2013-11-10 MED ORDER — LACTATED RINGERS IV SOLN
INTRAVENOUS | Status: DC
  Administered 2013-11-10 (×3): via INTRAVENOUS

## 2013-11-10 MED ORDER — MENTHOL 3 MG MT LOZG: 1.0000 | LOZENGE | OROMUCOSAL | Status: DC | PRN

## 2013-11-10 MED ORDER — STERILE WATER FOR IRRIGATION IR SOLN
Status: DC | PRN
  Administered 2013-11-10: 2000 mL

## 2013-11-10 MED ORDER — ESTRADIOL 0.1 MG/GM VA CREA
TOPICAL_CREAM | VAGINAL | Status: DC | PRN
  Administered 2013-11-10: 1 via VAGINAL

## 2013-11-10 MED ORDER — ROCURONIUM BROMIDE 100 MG/10ML IV SOLN
INTRAVENOUS | Status: AC
  Filled 2013-11-10: qty 1

## 2013-11-10 MED ORDER — ESTRADIOL 0.1 MG/GM VA CREA
TOPICAL_CREAM | VAGINAL | Status: AC
  Filled 2013-11-10: qty 42.5

## 2013-11-10 MED ORDER — MIDAZOLAM HCL 2 MG/2ML IJ SOLN
INTRAMUSCULAR | Status: DC | PRN
  Administered 2013-11-10: 2 mg via INTRAVENOUS

## 2013-11-10 MED ORDER — ROCURONIUM BROMIDE 100 MG/10ML IV SOLN
INTRAVENOUS | Status: DC | PRN
  Administered 2013-11-10: 40 mg via INTRAVENOUS

## 2013-11-10 MED ORDER — DIPHENHYDRAMINE HCL 50 MG/ML IJ SOLN: 12.5000 mg | Freq: Four times a day (QID) | INTRAMUSCULAR | Status: DC | PRN

## 2013-11-10 MED ORDER — METHYLENE BLUE 1 % INJ SOLN
INTRAMUSCULAR | Status: AC
  Filled 2013-11-10: qty 20

## 2013-11-10 MED ORDER — NEOSTIGMINE METHYLSULFATE 1 MG/ML IJ SOLN
INTRAMUSCULAR | Status: AC
  Filled 2013-11-10: qty 1

## 2013-11-10 MED ORDER — EPHEDRINE SULFATE 50 MG/ML IJ SOLN
INTRAMUSCULAR | Status: DC | PRN
  Administered 2013-11-10: 5 mg via INTRAVENOUS
  Administered 2013-11-10: 10 mg via INTRAVENOUS
  Administered 2013-11-10: 5 mg via INTRAVENOUS

## 2013-11-10 MED ORDER — SCOPOLAMINE 1 MG/3DAYS TD PT72SCOPOLAMINE 1 MG/3DAYS
1.0000 | MEDICATED_PATCH | TRANSDERMAL | Status: DC
Start: 2013-11-10 — End: 2013-11-10
  Administered 2013-11-10: 1.5 mg via TRANSDERMAL

## 2013-11-10 MED ORDER — GLYCOPYRROLATE 0.2 MG/ML IJ SOLN
INTRAMUSCULAR | Status: AC
  Filled 2013-11-10: qty 3

## 2013-11-10 MED ORDER — DEXAMETHASONE SODIUM PHOSPHATE 10 MG/ML IJ SOLN
INTRAMUSCULAR | Status: AC
  Filled 2013-11-10: qty 1

## 2013-11-10 MED ORDER — PHENYLEPHRINE 40 MCG/ML (10ML) SYRINGE FOR IV PUSH (FOR BLOOD PRESSURE SUPPORT)
PREFILLED_SYRINGE | INTRAVENOUS | Status: AC
  Filled 2013-11-10: qty 5

## 2013-11-10 MED ORDER — PROPOFOL 10 MG/ML IV EMUL
INTRAVENOUS | Status: AC
  Filled 2013-11-10: qty 20

## 2013-11-10 MED ORDER — FENTANYL CITRATE 0.05 MG/ML IJ SOLN
INTRAMUSCULAR | Status: DC | PRN
  Administered 2013-11-10 (×3): 50 ug via INTRAVENOUS
  Administered 2013-11-10: 100 ug via INTRAVENOUS

## 2013-11-10 MED ORDER — ONDANSETRON HCL 4 MG PO TABS: 4.0000 mg | ORAL_TABLET | Freq: Four times a day (QID) | ORAL | Status: DC | PRN

## 2013-11-10 MED ORDER — SODIUM CHLORIDE 0.9 % IJ SOLN: 9.0000 mL | INTRAMUSCULAR | Status: DC | PRN

## 2013-11-10 MED ORDER — GLYCOPYRROLATE 0.2 MG/ML IJ SOLN
INTRAMUSCULAR | Status: AC
  Filled 2013-11-10: qty 2

## 2013-11-10 MED ORDER — FENTANYL CITRATE 0.05 MG/ML IJ SOLN
INTRAMUSCULAR | Status: AC
  Filled 2013-11-10: qty 2

## 2013-11-10 MED ORDER — MEPERIDINE HCL 25 MG/ML IJ SOLN: 6.2500 mg | INTRAMUSCULAR | Status: DC | PRN

## 2013-11-10 MED ORDER — GLYCOPYRROLATE 0.2 MG/ML IJ SOLN
INTRAMUSCULAR | Status: DC | PRN
  Administered 2013-11-10: 0.6 mg via INTRAVENOUS
  Administered 2013-11-10: 0.3 mg via INTRAVENOUS

## 2013-11-10 MED ORDER — FENTANYL CITRATE 0.05 MG/ML IJ SOLN
INTRAMUSCULAR | Status: AC
  Filled 2013-11-10: qty 5

## 2013-11-10 MED ORDER — NALOXONE HCL 0.4 MG/ML IJ SOLN: 0.4000 mg | INTRAMUSCULAR | Status: DC | PRN

## 2013-11-10 MED ORDER — PROPOFOL 10 MG/ML IV BOLUS
INTRAVENOUS | Status: DC | PRN
  Administered 2013-11-10: 160 mg via INTRAVENOUS

## 2013-11-10 MED ORDER — IBUPROFEN 600 MG PO TABS: 600.0000 mg | ORAL_TABLET | Freq: Four times a day (QID) | ORAL | Status: DC | PRN

## 2013-11-10 MED ORDER — LIDOCAINE HCL (CARDIAC) 20 MG/ML IV SOLN
INTRAVENOUS | Status: AC
  Filled 2013-11-10: qty 5

## 2013-11-10 MED ORDER — NEOSTIGMINE METHYLSULFATE 1 MG/ML IJ SOLN
INTRAMUSCULAR | Status: DC | PRN
  Administered 2013-11-10: 3 mg via INTRAVENOUS

## 2013-11-10 MED ORDER — ONDANSETRON HCL 4 MG/2ML IJ SOLN
INTRAMUSCULAR | Status: DC | PRN
  Administered 2013-11-10: 4 mg via INTRAVENOUS

## 2013-11-10 MED ORDER — MIDAZOLAM HCL 2 MG/2ML IJ SOLN
INTRAMUSCULAR | Status: AC
  Filled 2013-11-10: qty 2

## 2013-11-10 MED ORDER — LIDOCAINE-EPINEPHRINE 1 %-1:100000 IJ SOLN
INTRAMUSCULAR | Status: DC | PRN
  Administered 2013-11-10: 14 mL

## 2013-11-10 MED ORDER — LIDOCAINE HCL (CARDIAC) 20 MG/ML IV SOLN
INTRAVENOUS | Status: DC | PRN
  Administered 2013-11-10: 30 mg via INTRAVENOUS
  Administered 2013-11-10: 70 mg via INTRAVENOUS

## 2013-11-10 MED ORDER — KETOROLAC TROMETHAMINE 15 MG/ML IJ SOLN
15.0000 mg | Freq: Four times a day (QID) | INTRAMUSCULAR | Status: DC
  Administered 2013-11-10 – 2013-11-11 (×4): 15 mg via INTRAVENOUS
  Filled 2013-11-10 (×5): qty 1

## 2013-11-10 MED ORDER — SCOPOLAMINE 1 MG/3DAYS TD PT72
MEDICATED_PATCH | TRANSDERMAL | Status: AC
  Administered 2013-11-10: 1.5 mg via TRANSDERMAL
  Filled 2013-11-10: qty 1

## 2013-11-10 MED ORDER — KETOROLAC TROMETHAMINE 30 MG/ML IJ SOLN
INTRAMUSCULAR | Status: DC | PRN
  Administered 2013-11-10: 30 mg via INTRAVENOUS

## 2013-11-10 MED ORDER — LACTATED RINGERS IV SOLN
INTRAVENOUS | Status: DC
  Administered 2013-11-10: 15:00:00 via INTRAVENOUS

## 2013-11-10 MED ORDER — METOCLOPRAMIDE HCL 5 MG/ML IJ SOLN: 10.0000 mg | Freq: Once | INTRAMUSCULAR | Status: DC | PRN

## 2013-11-10 SURGICAL SUPPLY — 49 items
ADH SKN CLS APL DERMABOND .7 (GAUZE/BANDAGES/DRESSINGS) ×1
BLADE SURG 11 STRL SS (BLADE) ×2 IMPLANT
BLADE SURG 15 STRL LF C SS BP (BLADE) IMPLANT
BLADE SURG 15 STRL SS (BLADE) ×2
CANISTER SUCT 3000ML (MISCELLANEOUS) ×3 IMPLANT
CATH FOLEY 2WAY SLVR  5CC 18FR (CATHETERS) ×1
CATH FOLEY 2WAY SLVR 5CC 18FR (CATHETERS) ×1 IMPLANT
CLOTH BEACON ORANGE TIMEOUT ST (SAFETY) ×2 IMPLANT
CONT PATH 16OZ SNAP LID 3702 (MISCELLANEOUS) IMPLANT
DECANTER SPIKE VIAL GLASS SM (MISCELLANEOUS) ×2 IMPLANT
DERMABOND ADVANCED (GAUZE/BANDAGES/DRESSINGS) ×1
DERMABOND ADVANCED .7 DNX12 (GAUZE/BANDAGES/DRESSINGS) ×1 IMPLANT
DEVICE CAPIO SLIM SINGLE (INSTRUMENTS) IMPLANT
DEVICE CAPIO SUTURING (INSTRUMENTS) ×1
DEVICE CAPIO SUTURING OPC (INSTRUMENTS) IMPLANT
DRAPE HYSTEROSCOPY (DRAPE) ×2 IMPLANT
GAUZE PACKING 2X5 YD STRL (GAUZE/BANDAGES/DRESSINGS) ×2 IMPLANT
GLOVE BIO SURGEON STRL SZ 6.5 (GLOVE) ×2 IMPLANT
GLOVE BIOGEL PI IND STRL 6.5 (GLOVE) ×1 IMPLANT
GLOVE BIOGEL PI INDICATOR 6.5 (GLOVE) ×1
GOWN STRL REUS W/TWL LRG LVL3 (GOWN DISPOSABLE) ×8 IMPLANT
NDL MAYO 6 CRC TAPER PT (NEEDLE) IMPLANT
NEEDLE HYPO 22GX1.5 SAFETY (NEEDLE) ×2 IMPLANT
NEEDLE MAYO 6 CRC TAPER PT (NEEDLE) IMPLANT
NS IRRIG 1000ML POUR BTL (IV SOLUTION) ×2 IMPLANT
PACK VAGINAL WOMENS (CUSTOM PROCEDURE TRAY) ×2 IMPLANT
PLUG CATH AND CAP STER (CATHETERS) ×2 IMPLANT
SET CYSTO W/LG BORE CLAMP LF (SET/KITS/TRAYS/PACK) ×2 IMPLANT
SLING ADVANTAGE TRANSVAGINL BX (SLING) ×1 IMPLANT
SLING TVT EXACT (Sling) ×1 IMPLANT
SPONGE SURGIFOAM ABS GEL 12-7 (HEMOSTASIS) IMPLANT
SURGIFLO TRUKIT (HEMOSTASIS) IMPLANT
SURGIFLO W/THROMBIN 8M KIT (HEMOSTASIS) ×1 IMPLANT
SUT CAPIO ETHIBPND (SUTURE) ×3 IMPLANT
SUT VIC AB 0 CT1 27 (SUTURE) ×4
SUT VIC AB 0 CT1 27XBRD ANBCTR (SUTURE) ×3 IMPLANT
SUT VIC AB 2-0 CT1 27 (SUTURE)
SUT VIC AB 2-0 CT1 TAPERPNT 27 (SUTURE) IMPLANT
SUT VIC AB 2-0 CT2 27 (SUTURE) ×2 IMPLANT
SUT VIC AB 2-0 SH 27 (SUTURE) ×6
SUT VIC AB 2-0 SH 27XBRD (SUTURE) ×4 IMPLANT
SUT VIC AB 2-0 UR6 27 (SUTURE) IMPLANT
SUT VIC AB 3-0 PS2 18 (SUTURE) ×2
SUT VIC AB 3-0 PS2 18XBRD (SUTURE) IMPLANT
TOWEL OR 17X24 6PK STRL BLUE (TOWEL DISPOSABLE) ×4 IMPLANT
TRAY FOLEY BAG SILVER LF 16FR (CATHETERS) ×2 IMPLANT
TRAY FOLEY CATH 14FR (SET/KITS/TRAYS/PACK) ×1 IMPLANT
TUBING CONNECTING 10 (TUBING) ×2 IMPLANT
WATER STERILE IRR 1000ML POUR (IV SOLUTION) ×1 IMPLANT

## 2013-11-10 NOTE — Progress Notes (Signed)
Day of Surgery Procedure(s) (LRB): TRANSVAGINAL TAPE (TVT) PROCEDURE (N/A) CYSTOSCOPY (N/A) ANTERIOR (CYSTOCELE) AND POSTERIOR REPAIR (RECTOCELE) WITH   SACROSPINOUS FIXATION (N/A)  Subjective: Patient reports good pain control.   Will get out of bed soon with nursing help.  Objective: I have reviewed patient's vital signs and intake and output.  General:  Alert. Lungs:  CTA bilaterally. Cor:  S1S2 RRR. Abdomen:  Positive bowel sounds, soft, nontender, nondistended.  Incisions right covered and dry.  Left intact and dry.  Vaginal pad:  Minimal bleeding. Ext:  PAS and ted hose on. DPs 2+ bilaterally.   Assessment: s/p Procedure(s): TRANSVAGINAL TAPE (TVT) PROCEDURE (N/A) CYSTOSCOPY (N/A) ANTERIOR (CYSTOCELE) AND POSTERIOR REPAIR (RECTOCELE) WITH   SACROSPINOUS FIXATION (N/A): stable  Plan: Advance diet Continue foley due to  post op state  PCA and toradol for pain.  CBC and BMP in am. I reviewed surgical findings and procedure with patient.  Questions answered.   LOS: 0 days    [place] 11/10/2013, 4:16 PM

## 2013-11-10 NOTE — Brief Op Note (Signed)
11/10/2013  10:26 AM  PATIENT:  [name]  53 y.o. female  PRE-OPERATIVE DIAGNOSIS:  Genuine stress incontinence ,incomplete vaginal prolapse  POST-OPERATIVE DIAGNOSIS:  Genuine stress incontinence ,incomplete vaginal prolapse  PROCEDURE:  Procedure(s): TRANSVAGINAL TAPE (TVT) PROCEDURE (N/A) CYSTOSCOPY (N/A) ANTERIOR (CYSTOCELE) AND POSTERIOR REPAIR (RECTOCELE) AND SACROSPINOUS FIXATION  SURGEON:  Surgeon(s) and Role:    * Everardo Voris E Amundson de Berton Lan, MD - Primary    * Lyman Speller, MD - Assisting  PHYSICIAN ASSISTANT:   ASSISTANTS:  Felipa Emory, M.D.   ANESTHESIA:   local  EBL:  Total I/O In: 2000 [I.V.:2000] Out: 700 [Urine:600; Blood:100]  BLOOD ADMINISTERED:none  DRAINS: Urinary Catheter (Foley)   LOCAL MEDICATIONS USED:  LIDOCAINE   SPECIMEN:  No Specimen  DISPOSITION OF SPECIMEN:  N/A  COUNTS:  YES  TOURNIQUET:  * No tourniquets in log *  DICTATION: .Other Dictation: Dictation Number    PLAN OF CARE: Admit for overnight observation  PATIENT DISPOSITION:  PACU - hemodynamically stable.   Delay start of Pharmacological VTE agent (>24hrs) due to surgical blood loss or risk of bleeding: not applicable

## 2013-11-10 NOTE — OR Nursing (Signed)
Called husband of patient in waiting room @ 09.38 hrs as per Dr. Quincy Simmonds to inform that all is going well and that it will probably be another 45 minutes or so before she goes to talk with him.

## 2013-11-10 NOTE — Anesthesia Postprocedure Evaluation (Signed)
  Anesthesia Post-op Note  Anesthesia Post Note  Patient: [name]  Procedure(s) Performed: Procedure(s) (LRB): TRANSVAGINAL TAPE (TVT) PROCEDURE (N/A) CYSTOSCOPY (N/A) ANTERIOR (CYSTOCELE) AND POSTERIOR REPAIR (RECTOCELE) AND SACROSPINOUS FIXATION  Anesthesia type: General  Patient location: PACU  Post pain: Pain level controlled  Post assessment: Post-op Vital signs reviewed  Last Vitals:  Filed Vitals:   11/10/13 1130  BP: 121/64  Pulse: 86  Temp: 36.7 C  Resp: 16    Post vital signs: Reviewed  Level of consciousness: sedated  Complications: No apparent anesthesia complications

## 2013-11-10 NOTE — Anesthesia Postprocedure Evaluation (Signed)
  Anesthesia Post-op Note  Patient: [name]  Procedure(s) Performed: Procedure(s): TRANSVAGINAL TAPE (TVT) PROCEDURE (N/A) CYSTOSCOPY (N/A) ANTERIOR (CYSTOCELE) AND POSTERIOR REPAIR (RECTOCELE) WITH   SACROSPINOUS FIXATION (N/A)  Patient Location: PACU and Women's Unit  Anesthesia Type:General  Level of Consciousness: awake, alert , oriented and patient cooperative  Airway and Oxygen Therapy: Patient Spontanous Breathing  Post-op Pain: none  Post-op Assessment: Post-op Vital signs reviewed, Patient's Cardiovascular Status Stable, Respiratory Function Stable, No signs of Nausea or vomiting, Adequate PO intake and Pain level controlled  Post-op Vital Signs: Reviewed and stable  Last Vitals:  Filed Vitals:   11/10/13 1519  BP: 113/59  Pulse: 73  Temp: 36.9 C  Resp: 15    Complications: No apparent anesthesia complications

## 2013-11-10 NOTE — Progress Notes (Signed)
Update to History and Physical  No marked change in status since preop visit.  No dysuria.  Patient examined. UA with small Hgb, few squams, few bacteria, 0-2 WBC, 0-2 RBC.  OK to proceed. Cipro and Flagyl for abx.

## 2013-11-10 NOTE — Addendum Note (Signed)
Addendum created 11/10/13 1629 by Alvy Bimler, CRNA   Modules edited: Notes Section   Notes Section:  File: [id number]

## 2013-11-10 NOTE — Transfer of Care (Signed)
Immediate Anesthesia Transfer of Care Note  Patient: FAUN MCQUEEN  Procedure(s) Performed: Procedure(s): TRANSVAGINAL TAPE (TVT) PROCEDURE (N/A) CYSTOSCOPY (N/A) ANTERIOR (CYSTOCELE) AND POSTERIOR REPAIR (RECTOCELE) AND SACROSPINOUS FIXATION  Patient Location: PACU  Anesthesia Type:General  Level of Consciousness: awake, alert , oriented and patient cooperative  Airway & Oxygen Therapy: Patient Spontanous Breathing and Patient connected to nasal cannula oxygen  Post-op Assessment: Report given to PACU RN and Post -op Vital signs reviewed and stable  Post vital signs: Reviewed and stable  Complications: No apparent anesthesia complications

## 2013-11-11 ENCOUNTER — Encounter (HOSPITAL_COMMUNITY): Payer: Self-pay | Admitting: Obstetrics and Gynecology

## 2013-11-11 ENCOUNTER — Other Ambulatory Visit: Payer: Self-pay | Admitting: Obstetrics and Gynecology

## 2013-11-11 ENCOUNTER — Telehealth: Payer: Self-pay | Admitting: Obstetrics and Gynecology

## 2013-11-11 LAB — CBC
HCT: 32.9 % — ABNORMAL LOW (ref 36.0–46.0)
Hemoglobin: 10.8 g/dL — ABNORMAL LOW (ref 12.0–15.0)
MCH: 30 pg (ref 26.0–34.0)
MCHC: 32.8 g/dL (ref 30.0–36.0)
MCV: 91.4 fL (ref 78.0–100.0)
PLATELETS: 190 10*3/uL (ref 150–400)
RBC: 3.6 MIL/uL — ABNORMAL LOW (ref 3.87–5.11)
RDW: 13.3 % (ref 11.5–15.5)
WBC: 10.8 10*3/uL — ABNORMAL HIGH (ref 4.0–10.5)

## 2013-11-11 LAB — BASIC METABOLIC PANEL
BUN: 10 mg/dL (ref 6–23)
CALCIUM: 8.4 mg/dL (ref 8.4–10.5)
CO2: 28 meq/L (ref 19–32)
CREATININE: 0.66 mg/dL (ref 0.50–1.10)
Chloride: 106 mEq/L (ref 96–112)
GFR calc Af Amer: >90 mL/min (ref >90)
GFR calc non Af Amer: >90 mL/min (ref >90)
Glucose, Bld: 84 mg/dL (ref 70–99)
Potassium: 3.8 mEq/L (ref 3.7–5.3)
Sodium: 142 mEq/L (ref 137–147)

## 2013-11-11 MED ORDER — OXYCODONE-ACETAMINOPHEN 5-325 MG PO TABS: 1.0000 | ORAL_TABLET | ORAL | Status: DC | PRN

## 2013-11-11 MED ORDER — IBUPROFEN 600 MG PO TABS: 600.0000 mg | ORAL_TABLET | Freq: Four times a day (QID) | ORAL | Status: DC | PRN

## 2013-11-11 MED ORDER — CIPROFLOXACIN HCL 250 MG PO TABS: 250.0000 mg | ORAL_TABLET | Freq: Two times a day (BID) | ORAL | Status: DC

## 2013-11-11 NOTE — Progress Notes (Signed)
1 Day Post-Op Procedure(s) (LRB): TRANSVAGINAL TAPE (TVT) PROCEDURE (N/A) CYSTOSCOPY (N/A) ANTERIOR (CYSTOCELE) AND POSTERIOR REPAIR (RECTOCELE) WITH   SACROSPINOUS FIXATION (N/A)  Subjective: Patient reports tolerating PO, + flatus and no problems voiding.   Vaginal packing out.  Vaginal pad change once during the night and then early this am.  Voided once with void 300 cc and 300 cc residual now.  Objective: I have reviewed patient's vital signs, intake and output and labs. Hgb 10.8, WBC 10.8  General: alert Resp: clear to auscultation bilaterally Cardio: regular rate and rhythm, S1, S2 normal, no murmur, click, rub or gallop GI: soft, non-tender; bowel sounds normal; no masses,  no organomegaly and incision: clean, dry and intact Extremities:  Ted hose  and PAS on.  Vaginal Bleeding: minimal  Assessment: s/p Procedure(s): TRANSVAGINAL TAPE (TVT) PROCEDURE (N/A) CYSTOSCOPY (N/A) ANTERIOR (CYSTOCELE) AND POSTERIOR REPAIR (RECTOCELE) WITH   SACROSPINOUS FIXATION (N/A): progressing well Some urinary retention. Anemia does not match clinical picture.   Plan: Encourage ambulation Discharge home  Continue voiding trials to see if needs foley at discharge.  Precautions and instructions given. Rx for Percocet and ibuprofen.  Follow up in one week, sooner as needed.   LOS: 1 day    [place] 11/11/2013, 7:38 AM

## 2013-11-11 NOTE — Discharge Instructions (Signed)
Cystocele Repair, Care After Refer to this sheet in the next few weeks. These instructions provide you with information on caring for yourself after your procedure. Your health care provider may also give you more specific instructions. Your treatment has been planned according to current medical practices, but problems sometimes occur. Call your health care provider if you have any problems or questions after your procedure. WHAT TO EXPECT AFTER THE PROCEDURE After your procedure, it is typical to have the following:  Bloody discharge from the vagina for 1 2 weeks.  A catheter in your bladder to drain urine as the bladder heals. You will be instructed on how to empty the bag. HOME CARE INSTRUCTIONS   Only take over-the-counter or prescription medicines as directed by your health care provider.  Do not take baths. Take showers until your health care provider tells you otherwise.  Exercise as instructed. Do not perform any exercise that increases the pressure on your abdomen, such as lifting weights or doing sit-ups, until your health care provider has given you permission.  You may resume your normal diet. Eat a well-balanced diet.  Drink enough fluids to keep your urine clear or pale yellow.  Avoid straining during bowel movements. If you become constipated, you may:  Take a mild laxative if your health care provider approves.  Add fruit and bran to your diet.  Drink more fluids.  Do not douche or have sexual intercourse for 6 weeks after your surgery.  Follow up with your health care provider as directed. SEEK MEDICAL CARE IF:  You have nausea or vomiting.  You have vaginal pain that is not relieved by pain medicines.  You feel a burning sensation during urination or have frequent urination.  SEEK IMMEDIATE MEDICAL CARE IF:   You have redness, swelling, or a bad-smelling discharge from the vagina.  You notice a bad smell coming from the vagina.  You have pus coming from  the vagina.  You have a fever.  You have abdominal pain.  You have excessive vaginal bleeding.  You have shortness of breath or chest pain. Document Released: 02/02/2005 Document Revised: 03/18/2013 Document Reviewed: 01/02/2013 [facility] Patient Information 2014 [place], [place].  Urethral Vaginal Sling A urethral vaginal sling procedure is surgery to correct urinary incontinence. Urinary incontinence is uncontrolled loss of urine. It is common in women who have had children and in older women. In this surgery, a strong piece of material is placed under the tube that drains the bladder (urethra). This sling is made of tension-free vaginal tape or nylon mesh. It fits under the urethra like a hammock. The sling is put in position to straighten, support, and hold the urethra in its normal position.  LET [facility] CARE PROVIDER KNOW ABOUT:   Any allergies you have.  All medicines you are taking, including vitamins, herbs, eye drops, creams, and over-the-counter medicines.  Previous problems you or members of your family have had with the use of anesthetics.  Any blood disorders you have.  Previous surgeries you have had.  Medical conditions you have. RISKS AND COMPLICATIONS  Generally, this is a safe procedure. However, as with any procedure, complications can occur. Possible complications include:  Infection.  Excessive bleeding.  Damage to other organs.  Problems urinating properly for several days or weeks.  Problems from the use of anesthetics.  Return of the urinary incontinence. BEFORE THE PROCEDURE   Ask your health care provider about changing or stopping your regular medicines. You may need to stop  taking certain medicines 1 week before the surgery.  Do not eat or drink anything for 6 8 hours before the surgery.  If you smoke, do not smoke for at least 2 weeks before the surgery.  Make plans to have someone drive you home after your hospital stay. Also arrange  for someone to help you with activities during recovery. PROCEDURE   You will have general or spinal anesthesia. With general anesthesia, you are asleep and will feel no pain. With spinal anesthesia, you are numb from the waist down, but you will still be awake.  A catheter is placed in your bladder to drain urine during the procedure.  An incision is made in your vagina and low on your belly in the hairline.  The sling material is passed around your bladder neck and sutured to the muscles to hold the urethra in its normal position.  The incisions are closed. AFTER THE PROCEDURE   You will be taken to a recovery area where your progress will be monitored closely. Your breathing, blood pressure, and pulse (vital signs) will be checked often. When you are stable, you will be moved to a regular hospital room.  You will have a catheter in place to drain your bladder. This will stay in place until your bladder is working properly on its own.  You may have a gauze packing in the vagina to prevent bleeding. This will be removed in 1 2 days.  You will likely need to stay in the hospital for 2 3 days. Document Released: 04/24/2008 Document Revised: 05/06/2013 Document Reviewed: 01/02/2013 [facility] Patient Information 2014 [place], [place].   Please call for fever, nausea and vomiting, pain uncontrolled by your medication, heavy bleeding, inability to void, pain and swelling in your leg or legs, or any other concern.

## 2013-11-11 NOTE — Op Note (Signed)
[name], [name] NO.:  [id number]  MEDICAL RECORD NO.:  [id number]  LOCATION:  1308                          FACILITY:  [facility]  PHYSICIAN:  Lenard Galloway, M.D.   DATE OF BIRTH:  17-Apr-1961  DATE OF PROCEDURE:  11/10/2013 DATE OF DISCHARGE:                              OPERATIVE REPORT   PREOPERATIVE DIAGNOSES:  Incomplete vaginal prolapse, genuine stress incontinence.  POSTOPERATIVE DIAGNOSES:  Incomplete vaginal prolapse, genuine stress incontinence.  PROCEDURES:  Anterior and posterior colporrhaphy, TVT Exact mid urethral sling with cystoscopy, right sacrospinous fixation.  SURGEON:  Lenard Galloway, M.D.  ASSISTANT:  Janice Speller, MD  ANESTHESIA:  General endotracheal, local with 1% lidocaine with epinephrine 1:100,000.  IV FLUIDS:  2000 mL Ringer's lactate.  EBL:  100 mL.  URINE OUTPUT:  600 mL.  COMPLICATIONS:  None.  INDICATIONS FOR THE PROCEDURE:  The patient is a 53 year old, gravida 2, para 2, Caucasian female, status post total vaginal hysterectomy in 2012, who presented upon the referral of [name], nurse practitioner for evaluation and treatment of a cystocele.  The patient reported symptomatic bladder prolapse.  The patient in the office was noted to have a second-degree cystocele and a first to second-degree rectocele.  The vaginal apex demonstrated a minimal amount of prolapse. The patient did undergo multichannel urodynamic testing in the office with reduction of the prolapse and she was diagnosed with occult genuine stress incontinence.  The patient wishes for surgery and a plan is now made to proceed with an anterior and posterior colporrhaphy with a TVT Exact midurethral sling, and cystoscopy and a possible sacrospinous fixation depending on the findings at the time of surgery.  Risks, benefits, and alternatives have been reviewed with the patient who wishes to proceed.  FINDINGS:  Examination under anesthesia  revealed a third-degree cystocele, with second-degree vaginal wall prolapse and a second-degree rectocele.  Cystoscopy demonstrated the bladder to be normal throughout 360 degrees including the bladder dome and trigone.  The ureters were noted to be patent bilaterally.  There was no evidence of a foreign body in the bladder and the urethra.  SPECIMEN:  None.  PROCEDURE:  The patient was reidentified in the preoperative holding area.  The patient did receive ciprofloxacin and Flagyl IV for antibiotic prophylaxis.  The patient received both TED hose and PAS stockings for DVT prophylaxis.  In the operating room, general endotracheal anesthesia was induced and the patient was then placed in the dorsal lithotomy position using Allen stirrups.  The lower abdomen, vagina, and perineum were then sterilely prepped and draped.  A Foley catheter was placed inside the bladder and left to gravity drainage throughout the procedure and at the termination of the procedure.  An examination under anesthesia was performed.  The findings are as noted above.  The procedure began by placing Allis clamps on the anterior vaginal wall beginning at 1 cm below the urethral meatus and extending down to the vaginal cuff.  The weighted speculum was placed inside the vagina.  The anterior vaginal mucosa was injected locally with 1% lidocaine with epinephrine 1:100,000.  The vaginal mucosa was then incised in the midline with a scalpel.  Hervey Ard  and blunt dissection were used to dissect the bladder and subvaginal tissue off of the vaginal mucosa.  This was performed bilaterally.  Hemostasis was good.  At this time, 1 cm suprapubic incisions were created, 2 cm to the right and left to the midline.  The Foley catheter was removed and the urethral obturator was Foley covering it was then placed inside the bladder.  The TVT Exact was performed in a bottom up fashion beginning on the patient's right-hand side.  The  urethra was deflected properly and the needle guide was brought up through the right suprapubic incision going through the endopelvic fascia to the right of the urethra.  This was performed without difficulty.  The same procedure was then performed on the left-hand side after the urethra was deflected properly again out of the trajectory of the obturator.  Cystoscopy was performed at this time and the findings are as noted above.  The ureters were noted to be somewhat distorted in their position due to the Allis's and a weighted speculum.  The ureters were noted to be very medial and close to the urethra bilaterally.  The bladder was drained of cystoscopic fluid and the Foley catheter was replaced.  The bladder was completely drained.  The sling was then brought up through the suprapubic incisions bilaterally.  A Kelly clamp was placed between the urethra and the sling as the plastic sheaths were removed.  The excess sling was trimmed suprapubically.  The sling was noted to be in excellent position.  The anterior colporrhaphy was performed at this time by placing vertical mattress sutures of 2-0 Vicryl for reduction of the cystocele.  Excess vaginal mucosa was then trimmed and the anterior vaginal wall was closed with a running lock suture of 2-0 Vicryl.  Cystoscopy was repeated at this time and the findings are again as noted above.  There was a good jet noted from each of the ureters which were  Located in a usual expected position.  The cystoscopic fluid was again drained and a Foley catheter was replaced.  The posterior colporrhaphy and sacrospinous fixation were performed next.  The Allis clamps were used to mark the introitus and the posterior vaginal mucosa in the midline.  Local 1% lidocaine with epinephrine 1:100,000 was then injected.  A triangular wedge of epithelium was excised from the perineal body and the posterior vaginal mucosa was excised vertically in the  midline.  Sharp and blunt dissection were used to dissect the rectovaginal fascia off of the overlying vaginal mucosa bilaterally.  The perirectal space was entered bluntly at this time and the right sacrospinous ligament was easily identified along with the ischial spine.  The sacrospinous ligament was cleared bluntly and without difficulty.  The Capio instrument was used at this time with 0 Ethibond suture and 2 sutures were placed in the sacrospinous ligament.  One that was 1 cm medial to the ischial spine and the other that was 2 cm medial to the ischial spine.  The sutures were sewn to the underside of the vaginal mucosa in the midline and just to the right of the midline on the ipsilateral side of the right sacrospinous ligament.  The posterior colporrhaphy was performed at this time with a combination of 1 suture of 2-0 Vicryl and the remaining sutures of 0 Vicryl.  These were all placed in a vertical mattress pattern for excellent reduction of the rectocele.  Excess vaginal mucosa was then trimmed bilaterally.  The posterior vaginal mucosa was closed  along 1/3 of its distance.  The sacrospinous sutures were then tied.  The more lateral suture, the suture that was 1 cm from the right ischial spine broke at this time.  It was not replaced as there was good elevation and support of the vaginal cuff at this time. The remainder of the vaginal mucosa was then closed with a running locked suture of 2-0 Vicryl down to the hymen.  The Foley catheter was removed and final cystoscopy was performed.  The findings are again as noted above and there was excellent patency of both of the ureters bilaterally.  There was no evidence of a suture or foreign body in the bladder and the urethra. The Foley catheter was replaced.  The cystoscopy fluid was drained. Rectal exam confirmed the absence of any sutures in the rectum and there was no palpable band suture between the vagina and the  sacrospinous ligament.  A crown suture of 0 Vicryl was placed in the perineal body. The suture was of 2-0 Vicryl was brought underneath the hymenal ring and down through the superficial perineal muscles in a running fashion.  The suture was then brought up the perineal body in a subcuticular fashion, and the knot was tied at the hymen.  There was excellent elevation and good support of the vagina at this time.  A gauze packing with Estrace cream was placed inside the vagina.  The suprapubic incisions were closed with subcuticular sutures of 3-0 Vicryl followed by Dermabond.  Final rectal exam was repeated and again there was no suture in the rectum.  This concluded the patient's procedure.  There were no complications. All needle, instrument, and sponge counts were correct.     Lenard Galloway, M.D.     BES/MEDQ  D:  11/10/2013  T:  11/11/2013  Job:  [id number]  cc:   Janice Pugh, NP

## 2013-11-11 NOTE — Progress Notes (Signed)
Pt. Is discharged in the  Care of husband.with R.N. Judd Lien. Discharge instructions with Rx were given to pt. Questions were asked and answered. Denies any pain or discomfort. Pt. Is discharge with foley catheter in place, Instruction were given on home care for foley. States she understands all instructions well.Lapsites are clean and dry.

## 2013-11-11 NOTE — Telephone Encounter (Signed)
Patient is calling to check in the antibiotics for after surgery. She was sent home with a catheter. Pharmacy on file [facility] [place].

## 2013-11-12 NOTE — Telephone Encounter (Signed)
Dr Quincy Simmonds notified and sent RX to pharmacy.  I called patient and notified her of this on 11-11-13.  Routing to provider for final review. Patient agreeable to disposition. Will close encounter

## 2013-11-16 ENCOUNTER — Ambulatory Visit: Payer: BC Managed Care – PPO | Admitting: Obstetrics and Gynecology

## 2013-11-16 ENCOUNTER — Telehealth: Payer: Self-pay

## 2013-11-16 NOTE — Telephone Encounter (Signed)
If last refill was 30 T w/0 refills, OK to refill.

## 2013-11-16 NOTE — Telephone Encounter (Signed)
[facility] a [facility] on Zolpidem 10 mg Last refill 08/19/2013 Last Ov 09/29/2013

## 2013-11-17 MED ORDER — ZOLPIDEM TARTRATE 10 MG PO TABS: 10.0000 mg | ORAL_TABLET | Freq: Every evening | ORAL | Status: DC | PRN

## 2013-11-17 NOTE — Telephone Encounter (Signed)
Called in Rx for [facility] to [facility].

## 2013-11-18 ENCOUNTER — Encounter: Payer: Self-pay | Admitting: Obstetrics and Gynecology

## 2013-11-18 ENCOUNTER — Ambulatory Visit (INDEPENDENT_AMBULATORY_CARE_PROVIDER_SITE_OTHER): Payer: BC Managed Care – PPO | Admitting: Obstetrics and Gynecology

## 2013-11-18 VITALS — BP 120/82 | HR 70 | Ht 64.0 in | Wt 152.2 lb

## 2013-11-18 DIAGNOSIS — Z9889 Other specified postprocedural states: Secondary | ICD-10-CM

## 2013-11-18 NOTE — Patient Instructions (Signed)
Call if you are unable to void after you are back at home!

## 2013-11-18 NOTE — Progress Notes (Signed)
Patient ID: [name], female   DOB: 08-Mar-1961, 53 y.o.   MRN: [id number] GYNECOLOGY  VISIT   HPI: 53 y.o.   Married  Caucasian  female   G2P2 with No LMP recorded. Patient has had a hysterectomy.   here for 1 week post op Anterior and posterior colporrhaphy/Transvaginal Tape Procedure/Cystoscopy.right sacrospinous fixation. Patient is here for catheter removal due to urinary retention.  Had tremendous urine output following surgery and had very large urinary volumes but had residuals over 100 cc.  Just finished Cipro po bid for prophylaxis of UTI.  Little vaginal bleeding.  Had a bowel movement 4 days ago.  Using Advil for pain.  Percocet only in am.  Right thigh numbness since surgery. No change since surgery.  Patient states having the foley catheter bothers her much more than the thigh numbness.   GYNECOLOGIC HISTORY: No LMP recorded. Patient has had a hysterectomy. Contraception: Hysterectomy   Menopausal hormone therapy: no        OB History   Grav Para Term Preterm Abortions TAB SAB Ect Mult Living   2 2        2      Obstetric Comments   Menarche 53 yo.         Patient Active Problem List   Diagnosis Date Noted  . Postoperative state 11/10/2013  . Cervicalgia 08/28/2013  . Hypertriglyceridemia 08/28/2013  . Unspecified vitamin D deficiency 08/28/2013  . Recurrent low back pain 01/20/2013  . Insomnia 01/19/2013  . Family history of premature coronary artery disease 01/19/2013    Past Medical History  Diagnosis Date  . Insomnia     started ambien 5 ya.  . Diverticulosis of colon   . History of rectocele   . Periodic heart flutter   . Urinary incontinence   . SVD (spontaneous vaginal delivery)     x 2  . Arthritis     neck    Past Surgical History  Procedure Laterality Date  . Vaginal hysterectomy  11/11     Vag cuff, mild rectocele  . Wisdom tooth extraction    . Colonoscopy    . Bladder suspension N/A 11/10/2013    Procedure: TRANSVAGINAL  TAPE (TVT) PROCEDURE;  Surgeon: Jamey Reas de Berton Lan, MD;  Location: [facility];  Service: Gynecology;  Laterality: N/A;  . Cystoscopy N/A 11/10/2013    Procedure: CYSTOSCOPY;  Surgeon: Jamey Reas de Berton Lan, MD;  Location: [facility];  Service: Gynecology;  Laterality: N/A;    Current Outpatient Prescriptions  Medication Sig Dispense Refill  . ibuprofen (ADVIL,MOTRIN) 600 MG tablet Take 1 tablet (600 mg total) by mouth every 6 (six) hours as needed (mild pain).  30 tablet  0  . oxyCODONE-acetaminophen (PERCOCET/ROXICET) 5-325 MG per tablet Take 1-2 tablets by mouth every 4 (four) hours as needed for severe pain (moderate to severe pain (when tolerating fluids)).  30 tablet  0  . zolpidem (AMBIEN) 10 MG tablet Take 1 tablet (10 mg total) by mouth at bedtime as needed for sleep.  30 tablet  0  . Biotin 10 MG CAPS Take 1 capsule by mouth daily.      . cholecalciferol (VITAMIN D) 1000 UNITS tablet Take 2,000 Units by mouth daily.      Marland Kitchen KRILL OIL PO Take 1 capsule by mouth daily.       No current facility-administered medications for this visit.     ALLERGIES: Amoxicillin and Sulfa antibiotics  Family History  Problem Relation Age of Onset  . Arthritis Mother   . Hypertension Mother   . Hyperlipidemia Mother   . Hypertension Father   . Hyperlipidemia Father   . Heart disease Father 104    smoked for 10 yrs., no muscle damage, 1 MI   . Pulmonary fibrosis Father     diagnosed end stage, cause unkn.  Marland Kitchen Hyperlipidemia Brother   . Diabetes Son   . Asthma Son   . Seizures Son   . Arthritis Maternal Grandmother   . Arthritis Paternal Grandmother   . Asthma Son     History   Social History  . Marital Status: Married    Spouse Name: [name]    Number of Children: 2  . Years of Education: 4   Occupational History  . teacher, reading specialist k-6    Social History Main Topics  . Smoking status: Never Smoker   . Smokeless tobacco: Never Used  . Alcohol Use:  Yes     Comment: ocassional wine  . Drug Use: No  . Sexual Activity: Yes    Partners: Male    Birth Control/ Protection: Surgical     Comment: hysterectomy--still has ovaries   Other Topics Concern  . Not on file   Social History Narrative   Relocated to [facility] from [place]. Due to husband job transfer. 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 She lives with her husband. She has 2 grown sons- oldest recently graduated from college and took a job in [place].; youngest just graduated HS & ia attending [place].      Has had regular preventive care in [place]. At [facility] w/Dr. Alfonse Spruce. Only health concern is insomnia that developed about 5 years ago & has been treated with [place]. She has not practiced sleep hygiene.    ROS:  Pertinent items are noted in HPI.  PHYSICAL EXAMINATION:    BP 120/82  Pulse 70  Ht 5\' 4"  (1.626 m)  Wt 152 lb 3.2 oz (69.037 kg)  BMI 26.11 kg/m2     General appearance: alert, cooperative and appears stated age No abnormal inguinal nodes palpated Suprapubic incisions intact.  Neuro:  Left inferior anterior thigh with decreased sensation.  Strength 5/5 throughout lower extremities.  Reflexes - brisk bilaterally at the patella.  Pelvic: External genitalia:  no lesions              Urethra:  normal appearing urethra with no masses, tenderness or lesions              Bartholins and Skenes: normal                 Vagina: normal appearing vagina with normal color and discharge, no lesions.  Good support.               Cervix: absent                   Bimanual Exam:  Uterus: absent                                      Adnexa:  no masses  Voiding trial - Janice Snowball, RN Void 75 cc. Sterile catheterization with betadine to urethra and small cath tip introduced into the bladder. PVR 10 cc.  ASSESSMENT  Voiding adequately.  Sensory femoral  neuropathy.   PLAN  Voiding parameters to the patient.  Follow up in 5 weeks for next post op visit.  Observation of neuropathy.  Physical therapy declined at this time.   An After Visit Summary was printed and given to the patient.

## 2013-11-19 ENCOUNTER — Encounter: Payer: Self-pay | Admitting: Nurse Practitioner

## 2013-11-19 DIAGNOSIS — N393 Stress incontinence (female) (male): Secondary | ICD-10-CM | POA: Insufficient documentation

## 2013-11-19 NOTE — Discharge Summary (Signed)
Physician Discharge Summary  Patient ID: [name] MRN: [id number] DOB/AGE: [birth year] 53 y.o.  Admit date: 11/10/2013 Discharge date: 11/11/13.  Admission Diagnoses:  Incomplete vaginal prolapse, genuine stress incontinence.   Discharge Diagnoses:  Incomplete vaginal prolapse, genuine stress incontinence, urinary retention.  Active Problems:   Postoperative state   Discharged Condition: good  Hospital Course: The patient was admitted on 11/10/13 for an anterior and posterior colporrhaphy with TVT Exact midurethral sling, cystoscopy, and sacrospinous fixation, which were performed without complication while under general anesthesia.  The patient's post op course was uneventful.  She had a morphine PCA and Toradol for pain control initially, and this was converted over to Percocet and Motrin on post op day one when the patient began taking po well.  She ambulated independently and wore PAS and Ted hose for DVT prophylaxis while in bed.  Her vaginal packing and foley catheter were removed on post op day one, and she voided good volumes, but had post void residuals over 100 cc.  With her third voiding trial, a foley catheter was replaced and maintained at the time of discharge.  The patient's vital signs remained stable and she demonstrated no signs of infection during her hospitalization.  The patient's post op day one Hgb was 10.8, and she was tolerating this well. She was found to be in good condition and ready for discharge on post op day one.  She will be discharged with a prescription for Ciprofloxacin for UTI proplylaxis.   Consults: None  Significant Diagnostic Studies: labs:  Hgb 10.8.  Treatments: surgery:  Anterior and posterior colporrhaphy with TVT Exact midurethral sling, cystoscopy, and sacrospinous fixation  Discharge Exam: Blood pressure 101/52, pulse 75, temperature 98 F (36.7 C), temperature source Oral, resp. rate 20, height 5' 3.5" (1.613 m), weight 154 lb (69.854  kg), SpO2 98.00%. General appearance: alert Resp: clear to auscultation bilaterally Cardio: regular rate and rhythm, S1, S2 normal, no murmur, click, rub or gallop GI: soft, non-tender; bowel sounds normal; no masses,  no organomegaly  Vaginal bleeding:  minimal  Disposition: 01-Home or Self Care   Future Appointments Provider Department Dept Phone   12/23/2013 9:30 AM Brook E Amundson de Berton Lan, MD [place]       Medication List    STOP taking these medications       naproxen sodium 220 MG tablet  Commonly known as:  ANAPROX     zolpidem 10 MG tablet  Commonly known as:  AMBIEN      TAKE these medications       Biotin 10 MG Caps  Take 1 capsule by mouth daily.     cholecalciferol 1000 UNITS tablet  Commonly known as:  VITAMIN D  Take 2,000 Units by mouth daily.     ibuprofen 600 MG tablet  Commonly known as:  ADVIL,MOTRIN  Take 1 tablet (600 mg total) by mouth every 6 (six) hours as needed (mild pain).     KRILL OIL PO  Take 1 capsule by mouth daily.     oxyCODONE-acetaminophen 5-325 MG per tablet  Commonly known as:  PERCOCET/ROXICET  Take 1-2 tablets by mouth every 4 (four) hours as needed for severe pain (moderate to severe pain (when tolerating fluids)).           Follow-up Information   Follow up with Amundson de Berton Lan, MD In 1 week.   Specialty:  Obstetrics and Gynecology   Contact information:  [place] [address] [phone]       Follow up with Amundson de Berton Lan, MD.   Specialty:  Obstetrics and Gynecology   Contact information:   [address] [place] [address] [phone]     Precautions and instructions reviewed and given to the patient.   Signed: Jamey Reas de Berton Lan 11/19/2013, 55:14 AM

## 2013-12-14 ENCOUNTER — Telehealth: Payer: Self-pay

## 2013-12-14 NOTE — Telephone Encounter (Signed)
pls send as med refill. 

## 2013-12-14 NOTE — Telephone Encounter (Signed)
Called in [facility] to [facility]  

## 2013-12-14 NOTE — Telephone Encounter (Signed)
[facility]em 10 mg. Last Ov 09/29/2013. Last refill 11/17/2013

## 2013-12-15 ENCOUNTER — Other Ambulatory Visit: Payer: Self-pay | Admitting: *Deleted

## 2013-12-15 MED ORDER — ZOLPIDEM TARTRATE 10 MG PO TABS: 10.0000 mg | ORAL_TABLET | Freq: Every evening | ORAL | Status: DC | PRN

## 2013-12-15 NOTE — Telephone Encounter (Signed)
Refill request for zolpidem Last filled by MD on - 11/17/2013 #30 x0 Last Appt: 10/26/2013 Next Appt: none Please advise refill?

## 2013-12-23 ENCOUNTER — Ambulatory Visit (INDEPENDENT_AMBULATORY_CARE_PROVIDER_SITE_OTHER): Payer: BC Managed Care – PPO | Admitting: Obstetrics and Gynecology

## 2013-12-23 ENCOUNTER — Encounter: Payer: Self-pay | Admitting: Obstetrics and Gynecology

## 2013-12-23 VITALS — BP 128/70 | HR 84 | Ht 64.0 in | Wt 153.8 lb

## 2013-12-23 DIAGNOSIS — M25559 Pain in unspecified hip: Secondary | ICD-10-CM

## 2013-12-23 NOTE — Progress Notes (Signed)
Patient ID: [name], female   DOB: [birth year], 53 y.o.   MRN: [id number] GYNECOLOGY  VISIT   HPI: 53 y.o.   Married  Caucasian  female   G2P2 with No LMP recorded. Patient has had a hysterectomy.   here for  6 week post op visit.   Status post TVT with anterior and posterior colporrhaphy, cystoscopy, and right sacrospinous fixation on 11/10/13. Having bilateral lower abdominal pain and pressure, especially with standing.  Menstrual like cramping.  Getting into bed is uncomfortable.  Standing prompts the discomfort. No bleeding for the last two weeks.  No dysuria.  No problems with bowel movements.   Still has ovaries.   Mother just had an MI.  May have stent surgery.   Urine dip:   Negative.  GYNECOLOGIC HISTORY: No LMP recorded. Patient has had a hysterectomy. Contraception:  hysterectomy  Menopausal hormone therapy: no        OB History   Grav Para Term Preterm Abortions TAB SAB Ect Mult Living   2 2        2      Obstetric Comments   Menarche 53 yo.         Patient Active Problem List   Diagnosis Date Noted  . Stress incontinence 11/19/2013  . Postoperative state 11/10/2013  . Cervicalgia 08/28/2013  . Hypertriglyceridemia 08/28/2013  . Unspecified vitamin D deficiency 08/28/2013  . Recurrent low back pain 01/20/2013  . Insomnia 01/19/2013  . Family history of premature coronary artery disease 01/19/2013    Past Medical History  Diagnosis Date  . Insomnia     started ambien 5 ya.  . Diverticulosis of colon   . History of rectocele   . Periodic heart flutter   . Urinary incontinence   . SVD (spontaneous vaginal delivery)     x 2  . Arthritis     neck    Past Surgical History  Procedure Laterality Date  . Vaginal hysterectomy  11/11     Vag cuff, mild rectocele  . Wisdom tooth extraction    . Colonoscopy    . Bladder suspension N/A 11/10/2013    Procedure: TRANSVAGINAL TAPE (TVT) PROCEDURE;  Surgeon: Jamey Reas de Berton Lan, MD;   Location: [facility];  Service: Gynecology;  Laterality: N/A;  . Cystoscopy N/A 11/10/2013    Procedure: CYSTOSCOPY;  Surgeon: Jamey Reas de Berton Lan, MD;  Location: [facility];  Service: Gynecology;  Laterality: N/A;    Current Outpatient Prescriptions  Medication Sig Dispense Refill  . Biotin 10 MG CAPS Take 1 capsule by mouth daily.      . cholecalciferol (VITAMIN D) 1000 UNITS tablet Take 2,000 Units by mouth daily.      Marland Kitchen ibuprofen (ADVIL,MOTRIN) 600 MG tablet Take 1 tablet (600 mg total) by mouth every 6 (six) hours as needed (mild pain).  30 tablet  0  . KRILL OIL PO Take 1 capsule by mouth daily.      Marland Kitchen zolpidem (AMBIEN) 10 MG tablet Take 1 tablet (10 mg total) by mouth at bedtime as needed for sleep.  30 tablet  3   No current facility-administered medications for this visit.     ALLERGIES: Amoxicillin and Sulfa antibiotics  Family History  Problem Relation Age of Onset  . Arthritis Mother   . Hypertension Mother   . Hyperlipidemia Mother   . Hypertension Father   . Hyperlipidemia Father   . Heart disease Father 34  smoked for 10 yrs., no muscle damage, 1 MI   . Pulmonary fibrosis Father     diagnosed end stage, cause unkn.  Marland Kitchen Hyperlipidemia Brother   . Diabetes Son   . Asthma Son   . Seizures Son   . Arthritis Maternal Grandmother   . Arthritis Paternal Grandmother   . Asthma Son     History   Social History  . Marital Status: Married    Spouse Name: Phillip Heal    Number of Children: 2  . Years of Education: 4   Occupational History  . teacher, reading specialist k-6    Social History Main Topics  . Smoking status: Never Smoker   . Smokeless tobacco: Never Used  . Alcohol Use: Yes     Comment: ocassional wine  . Drug Use: No  . Sexual Activity: Yes    Partners: Male    Birth Control/ Protection: Surgical     Comment: hysterectomy--still has ovaries   Other Topics Concern  . Not on file   Social History Narrative   Relocated to [facility]  from [place]. Due to husband job transfer. 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 She lives with her husband. She has 2 grown sons- oldest recently graduated from college and took a job in [place].; youngest just graduated HS & ia attending [employer].      Has had regular preventive care in [place]. At [facility] w/Dr. Alfonse Spruce. Only health concern is insomnia that developed about 5 years ago & has been treated with [place]. She has not practiced sleep hygiene.    ROS:  Pertinent items are noted in HPI.  PHYSICAL EXAMINATION:    BP 128/70  Pulse 84  Ht 5\' 4"  (1.626 m)  Wt 153 lb 12.8 oz (69.763 kg)  BMI 26.39 kg/m2     General appearance: alert, cooperative and appears stated age Lungs: clear to auscultation bilaterally Heart: regular rate and rhythm Abdomen: soft, non-tender; no masses,  no organomegaly No abnormal inguinal nodes palpated  Pelvic: External genitalia:  no lesions              Urethra:  normal appearing urethra with no masses, tenderness or lesions              Bartholins and Skenes: normal                 Vagina: normal appearing vagina with normal color and discharge, no lesions.  Good support.  Good caliber.  Suture material present at the top of the vagina.  Sling protected and not visible nor palpable.              Cervix:  absent                   Bimanual Exam:  Uterus:   absent                                      Adnexa: normal adnexa in size, nontender and no masses                                  ASSESSMENT  Doing well post op. Pelvic pressure/cramping.   PLAN  Urine culture.  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  Has papers for return to work. Counseled on continued decreased lifting.   Return in 6 weeks for final post op visit.    An After Visit Summary was printed and given to the patient.  _15_____ minutes face to face time of which over 50% was spent in  counseling.

## 2013-12-24 LAB — URINE CULTURE
Colony Count: NO GROWTH
Organism ID, Bacteria: NO GROWTH

## 2014-02-03 ENCOUNTER — Ambulatory Visit (INDEPENDENT_AMBULATORY_CARE_PROVIDER_SITE_OTHER): Payer: PRIVATE HEALTH INSURANCE | Admitting: Obstetrics and Gynecology

## 2014-02-03 ENCOUNTER — Encounter: Payer: Self-pay | Admitting: Obstetrics and Gynecology

## 2014-02-03 VITALS — BP 120/66 | HR 80 | Resp 16 | Ht 64.0 in | Wt 156.0 lb

## 2014-02-03 DIAGNOSIS — Z09 Encounter for follow-up examination after completed treatment for conditions other than malignant neoplasm: Secondary | ICD-10-CM

## 2014-02-03 NOTE — Progress Notes (Addendum)
GYNECOLOGY  VISIT   HPI: 53 y.o.   Married  Caucasian  female   G2P2 with No LMP recorded. Patient has had a hysterectomy.   here for postop  Anterior and posterior colporrhaphy, TVT Exact mid urethral  sling with cystoscopy, right sacrospinous fixation 11/10/13.   Doing well overall.  Good bladder and bowel function.   Wants to do GYN routine exams here at this office.   GYNECOLOGIC HISTORY: No LMP recorded. Patient has had a hysterectomy. Contraception:   Post - Hysterectomy Menopausal hormone therapy: N/A        OB History   Grav Para Term Preterm Abortions TAB SAB Ect Mult Living   2 2        2      Obstetric Comments   Menarche 53 yo.         Patient Active Problem List   Diagnosis Date Noted  . Stress incontinence 11/19/2013  . Postoperative state 11/10/2013  . Cervicalgia 08/28/2013  . Hypertriglyceridemia 08/28/2013  . Unspecified vitamin D deficiency 08/28/2013  . Recurrent low back pain 01/20/2013  . Insomnia 01/19/2013  . Family history of premature coronary artery disease 01/19/2013    Past Medical History  Diagnosis Date  . Insomnia     started ambien 5 ya.  . Diverticulosis of colon   . History of rectocele   . Periodic heart flutter   . Urinary incontinence   . SVD (spontaneous vaginal delivery)     x 2  . Arthritis     neck    Past Surgical History  Procedure Laterality Date  . Vaginal hysterectomy  11/11     Vag cuff, mild rectocele  . Wisdom tooth extraction    . Colonoscopy    . Bladder suspension N/A 11/10/2013    Procedure: TRANSVAGINAL TAPE (TVT) PROCEDURE;  Surgeon: Jamey Reas de Berton Lan, MD;  Location: [facility];  Service: Gynecology;  Laterality: N/A;  . Cystoscopy N/A 11/10/2013    Procedure: CYSTOSCOPY;  Surgeon: Jamey Reas de Berton Lan, MD;  Location: [facility];  Service: Gynecology;  Laterality: N/A;    Current Outpatient Prescriptions  Medication Sig Dispense Refill  . Biotin 10 MG CAPS Take 1 capsule  by mouth daily.      . cholecalciferol (VITAMIN D) 1000 UNITS tablet Take 2,000 Units by mouth daily.      Marland Kitchen KRILL OIL PO Take 1 capsule by mouth daily.      Marland Kitchen ibuprofen (ADVIL,MOTRIN) 600 MG tablet Take 1 tablet (600 mg total) by mouth every 6 (six) hours as needed (mild pain).  30 tablet  0   No current facility-administered medications for this visit.     ALLERGIES: Amoxicillin and Sulfa antibiotics  Family History  Problem Relation Age of Onset  . Arthritis Mother   . Hypertension Mother   . Hyperlipidemia Mother   . Hypertension Father   . Hyperlipidemia Father   . Heart disease Father 51    smoked for 10 yrs., no muscle damage, 1 MI   . Pulmonary fibrosis Father     diagnosed end stage, cause unkn.  Marland Kitchen Hyperlipidemia Brother   . Diabetes Son   . Asthma Son   . Seizures Son   . Arthritis Maternal Grandmother   . Arthritis Paternal Grandmother   . Asthma Son     History   Social History  . Marital Status: Married    Spouse Name: Phillip Heal    Number of Children:  2  . Years of Education: 4   Occupational History  . teacher, reading specialist k-6    Social History Main Topics  . Smoking status: Never Smoker   . Smokeless tobacco: Never Used  . Alcohol Use: Yes     Comment: ocassional wine  . Drug Use: No  . Sexual Activity: Yes    Partners: Male    Birth Control/ Protection: Surgical     Comment: hysterectomy--still has ovaries   Other Topics Concern  . Not on file   Social History Narrative   Relocated to [facility] from [place]. Due to husband job transfer. 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 She lives with her husband. She has 2 grown sons- oldest recently graduated from college and took a job in [place].; youngest just graduated HS & ia attending [employer].      Has had regular preventive care in [place]. At [facility] w/Dr. Alfonse Spruce. Only health concern is insomnia that developed  about 5 years ago & has been treated with [place]. She has not practiced sleep hygiene.    ROS:  Pertinent items are noted in HPI.  PHYSICAL EXAMINATION:    BP 120/66  Pulse 80  Resp 16  Ht 5\' 4"  (1.626 m)  Wt 156 lb (70.761 kg)  BMI 26.76 kg/m2     General appearance: alert, cooperative and appears stated age   Pelvic: External genitalia:  no lesions              Urethra:  normal appearing urethra with no masses, tenderness or lesions              Bartholins and Skenes: normal                 Vagina: normal appearing vagina with normal color and discharge, no lesions.  Midurethral sling protected and not visible or palpable. Good vaginal length and caliber.               Cervix: absent                   Bimanual Exam:  Uterus:  absent                                      Adnexa: no masses                                      Rectovaginal: Confirms                                      Anus:  normal sphincter tone, no lesions  ASSESSMENT  Status post  Anterior and posterior colporrhaphy, TVT Exact mid urethral  sling with cystoscopy, right sacrospinous fixation 11/10/13.   Doing well.    PLAN  Return to all normal activities.  Annual exam in November 2015.  Mammogram in November 2015.   An After Visit Summary was printed and given to the patient.  __20____ minutes face to face time of which over 50% was spent in counseling.

## 2014-03-16 ENCOUNTER — Encounter: Payer: Self-pay | Admitting: Nurse Practitioner

## 2014-03-16 ENCOUNTER — Ambulatory Visit (INDEPENDENT_AMBULATORY_CARE_PROVIDER_SITE_OTHER): Payer: PRIVATE HEALTH INSURANCE | Admitting: Nurse Practitioner

## 2014-03-16 VITALS — BP 118/75 | HR 83 | Temp 98.2°F | Ht 64.0 in | Wt 157.0 lb

## 2014-03-16 DIAGNOSIS — M25519 Pain in unspecified shoulder: Secondary | ICD-10-CM

## 2014-03-16 DIAGNOSIS — E781 Pure hyperglyceridemia: Secondary | ICD-10-CM

## 2014-03-16 DIAGNOSIS — Z020 Encounter for examination for admission to educational institution: Secondary | ICD-10-CM

## 2014-03-16 DIAGNOSIS — Z0289 Encounter for other administrative examinations: Secondary | ICD-10-CM

## 2014-03-16 DIAGNOSIS — Z111 Encounter for screening for respiratory tuberculosis: Secondary | ICD-10-CM

## 2014-03-16 DIAGNOSIS — M25512 Pain in left shoulder: Secondary | ICD-10-CM

## 2014-03-16 NOTE — Patient Instructions (Signed)
Return on Friday before 1 pm to have test read.  Let me know if you want to see orthopedist about Left shoulder. In the meantime , do shoulder exercises as discussed. Get stretch band.  We will have flu shots next month. Return at your convenience to monitor lipids, vitamin D & other baseline labs.  Nice to see you!

## 2014-03-16 NOTE — Progress Notes (Signed)
Pre visit review using our clinic review tool, if applicable. No additional management support is needed unless otherwise documented below in the visit note. 

## 2014-03-17 DIAGNOSIS — Z111 Encounter for screening for respiratory tuberculosis: Secondary | ICD-10-CM | POA: Insufficient documentation

## 2014-03-17 DIAGNOSIS — Z020 Encounter for examination for admission to educational institution: Secondary | ICD-10-CM | POA: Insufficient documentation

## 2014-03-17 DIAGNOSIS — M25512 Pain in left shoulder: Secondary | ICD-10-CM | POA: Insufficient documentation

## 2014-03-17 NOTE — Assessment & Plan Note (Addendum)
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 Stretch band exercises for mobility & strengthening. Pt declines ref to ortho-will call if changes mind.

## 2014-03-17 NOTE — Progress Notes (Signed)
Subjective:     [name] is a 53 y.o. female and is here for employer health screen. She requests 1 pg form be filled out & signed. The patient reports problems - L shoulder pain at deltoid.Marland Kitchen  History   Social History  . Marital Status: Married    Spouse Name: Phillip Heal    Number of Children: 2  . Years of Education: 4   Occupational History  . teacher, reading specialist k-6    Social History Main Topics  . Smoking status: Never Smoker   . Smokeless tobacco: Never Used  . Alcohol Use: Yes     Comment: ocassional wine  . Drug Use: No  . Sexual Activity: Yes    Partners: Male    Birth Control/ Protection: Surgical     Comment: hysterectomy--still has ovaries   Other Topics Concern  . Not on file   Social History Narrative   Relocated to [facility] from [place]. Due to husband job transfer. 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 She lives with her husband. She has 2 grown sons- oldest recently graduated from college and took a job in [place].; youngest just graduated HS & ia attending [employer].      Has had regular preventive care in [place]. At [facility] w/Dr. Alfonse Spruce. Only health concern is insomnia that developed about 5 years ago & has been treated with [place]. She has not practiced sleep hygiene.   Health Maintenance  Topic Date Due  . Influenza Vaccine  02/27/2014  . Pap Smear  01/21/2015  . Mammogram  06/13/2015  . Colonoscopy  10/26/2019  . Tetanus/tdap  04/04/2023    The following portions of the patient's history were reviewed and updated as appropriate: allergies, current medications, past family history, past medical history, past social history, past surgical history and problem list.  Review of Systems Pertinent items are noted in HPI.   Objective:    BP 118/75  Pulse 83  Temp(Src) 98.2 F (36.8 C) (Temporal)  Ht 5\' 4"  (1.626 m)  Wt 157 lb (71.215 kg)  BMI 26.94 kg/m2   SpO2 98% General appearance: alert, cooperative, appears stated age and no distress Head: Normocephalic, without obvious abnormality, atraumatic Eyes: negative findings: lids and lashes normal and conjunctivae and sclerae normal Throat: lips, mucosa, and tongue normal; teeth and gums normal Neck: no adenopathy, no carotid bruit, supple, symmetrical, trachea midline and thyroid not enlarged, symmetric, no tenderness/mass/nodules Lungs: clear to auscultation bilaterally Heart: regular rate and rhythm, S1, S2 normal, no murmur, click, rub or gallop Extremities: extremities normal, atraumatic, no cyanosis or edema Pulses: 2+ and symmetric    Assessment:   1. Screening-pulmonary TB - TB Skin Test  2. Pain in left shoulder  3. School physical exam  See problem list for complete A&P See pt instructions.

## 2014-03-17 NOTE — Assessment & Plan Note (Signed)
Fasting lipids at next visit.

## 2014-03-17 NOTE — Assessment & Plan Note (Addendum)
Needs form filled out for [employer]. Applying for sub job. Just retired from [employer]. WIll sign form when returns for Tb read. recmd Hep B vaccine series. Pt will consider.

## 2014-03-19 ENCOUNTER — Ambulatory Visit: Payer: PRIVATE HEALTH INSURANCE

## 2014-03-19 ENCOUNTER — Encounter: Payer: Self-pay | Admitting: Nurse Practitioner

## 2014-03-19 LAB — TB SKIN TEST
Induration: 0 mm
TB Skin Test: NEGATIVE

## 2014-04-06 ENCOUNTER — Encounter: Payer: Self-pay | Admitting: Obstetrics and Gynecology

## 2014-05-04 ENCOUNTER — Other Ambulatory Visit: Payer: Self-pay | Admitting: *Deleted

## 2014-05-04 NOTE — Telephone Encounter (Signed)
Refill request for zolpidem Last filled by MD on- 11/17/13 #30 x0 Last Appt: 03/19/2014 Next Appt: none Please advise refill?

## 2014-05-05 MED ORDER — ZOLPIDEM TARTRATE 10 MG PO TABS: 10.0000 mg | ORAL_TABLET | Freq: Every evening | ORAL | Status: DC | PRN

## 2014-05-05 NOTE — Addendum Note (Signed)
Addended by: Julieta Bellini on: 05/05/2014 11:51 AM   Modules accepted: Orders

## 2014-05-14 ENCOUNTER — Other Ambulatory Visit: Payer: Self-pay

## 2014-05-18 ENCOUNTER — Other Ambulatory Visit: Payer: Self-pay | Admitting: Nurse Practitioner

## 2014-05-18 DIAGNOSIS — Z1231 Encounter for screening mammogram for malignant neoplasm of breast: Secondary | ICD-10-CM

## 2014-05-31 ENCOUNTER — Encounter: Payer: Self-pay | Admitting: Nurse Practitioner

## 2014-06-01 ENCOUNTER — Encounter: Payer: Self-pay | Admitting: Nurse Practitioner

## 2014-06-10 ENCOUNTER — Other Ambulatory Visit (INDEPENDENT_AMBULATORY_CARE_PROVIDER_SITE_OTHER): Payer: PRIVATE HEALTH INSURANCE

## 2014-06-10 ENCOUNTER — Encounter: Payer: Self-pay | Admitting: Obstetrics and Gynecology

## 2014-06-10 ENCOUNTER — Ambulatory Visit (INDEPENDENT_AMBULATORY_CARE_PROVIDER_SITE_OTHER): Payer: PRIVATE HEALTH INSURANCE | Admitting: Obstetrics and Gynecology

## 2014-06-10 ENCOUNTER — Ambulatory Visit: Payer: PRIVATE HEALTH INSURANCE | Admitting: Obstetrics and Gynecology

## 2014-06-10 ENCOUNTER — Telehealth: Payer: Self-pay

## 2014-06-10 VITALS — BP 120/82 | HR 76 | Resp 16 | Ht 64.0 in | Wt 159.8 lb

## 2014-06-10 DIAGNOSIS — E781 Pure hyperglyceridemia: Secondary | ICD-10-CM

## 2014-06-10 DIAGNOSIS — R7989 Other specified abnormal findings of blood chemistry: Secondary | ICD-10-CM

## 2014-06-10 DIAGNOSIS — Z01419 Encounter for gynecological examination (general) (routine) without abnormal findings: Secondary | ICD-10-CM

## 2014-06-10 DIAGNOSIS — Z0000 Encounter for general adult medical examination without abnormal findings: Secondary | ICD-10-CM

## 2014-06-10 LAB — LIPID PANEL
CHOLESTEROL: 273 mg/dL — AB (ref 0–200)
HDL: 38.5 mg/dL — ABNORMAL LOW (ref >39.00)
NonHDL: 234.5
Total CHOL/HDL Ratio: 7
Triglycerides: 248 mg/dL — ABNORMAL HIGH (ref 0.0–149.0)
VLDL: 49.6 mg/dL — ABNORMAL HIGH (ref 0.0–40.0)

## 2014-06-10 LAB — POCT URINALYSIS DIPSTICK
Bilirubin, UA: NEGATIVE
Glucose, UA: NEGATIVE
KETONES UA: NEGATIVE
Leukocytes, UA: NEGATIVE
Nitrite, UA: NEGATIVE
PROTEIN UA: NEGATIVE
RBC UA: NEGATIVE
Urobilinogen, UA: NEGATIVE
pH, UA: 5

## 2014-06-10 LAB — VITAMIN D 25 HYDROXY (VIT D DEFICIENCY, FRACTURES): VITD: 31.05 ng/mL (ref 30.00–100.00)

## 2014-06-10 NOTE — Telephone Encounter (Signed)
Pt came in to get blood drawn. I believe a vit D and lipid should be ordered. Please advise.

## 2014-06-10 NOTE — Patient Instructions (Signed)

## 2014-06-10 NOTE — Progress Notes (Signed)
Patient ID: [name], female   DOB: 06/19/1961, 53 y.o.   MRN: [id number] 53 y.o. G2P2 MarriedCaucasianF here for annual exam.    Status post anterior and posterior colporrhaphy, sacrospinous fixation, and TVT/cystoscopy 11/10/13. Good bladder control.  Normal bowel movements.  No prolapse.  Uses KY jelly for lubrication for intercourse.  Mother and father both passed away within a short period of time.  Father passed from pulmonary fibrosis.  Patient retired from [employer].  Patient want to take up running and do a 5K  PCP:  Nicky Pugh, NP  No LMP recorded. Patient has had a hysterectomy.          Sexually active: Yes.  female  The current method of family planning is status post hysterectomy.    Exercising: Yes.    walking. Smoker:  no  Health Maintenance: OQH:[id number] wnl:in [place]--unsure of HPV testing  History of abnormal Pap:  no MMG:  06-12-13 heterogeneously dense/nl:[facility] (has appt.06-14-14 for Mmg) Colonoscopy:  2011 normal:in [place].  Next colonoscopy due 2021. BMD:   2013 osteopenia in left femur:in [place], [place]. TDaP:  2014 Screening Labs: Hb today: PCP, Urine today: Neg   reports that she has never smoked. She has never used smokeless tobacco. She reports that she drinks alcohol. She reports that she does not use illicit drugs.  Past Medical History  Diagnosis Date  . Insomnia     started ambien 5 ya.  . Diverticulosis of colon   . History of rectocele   . Periodic heart flutter   . Urinary incontinence   . SVD (spontaneous vaginal delivery)     x 2  . Arthritis     neck  . Osteopenia     --left femur    Past Surgical History  Procedure Laterality Date  . Vaginal hysterectomy  11/11     Vag cuff, mild rectocele  . Wisdom tooth extraction    . Colonoscopy    . Bladder suspension N/A 11/10/2013    Procedure: TRANSVAGINAL TAPE (TVT) PROCEDURE;  Surgeon: Jamey Reas de Berton Lan, MD;  Location: [facility];   Service: Gynecology;  Laterality: N/A;  . Cystoscopy N/A 11/10/2013    Procedure: CYSTOSCOPY;  Surgeon: Jamey Reas de Berton Lan, MD;  Location: [facility];  Service: Gynecology;  Laterality: N/A;    Current Outpatient Prescriptions  Medication Sig Dispense Refill  . Biotin 10 MG CAPS Take 1 capsule by mouth daily.    . cholecalciferol (VITAMIN D) 1000 UNITS tablet Take 2,000 Units by mouth daily.    Marland Kitchen KRILL OIL PO Take 1 capsule by mouth daily.    Marland Kitchen zolpidem (AMBIEN) 10 MG tablet Take 1 tablet (10 mg total) by mouth at bedtime as needed for sleep. 30 tablet 1   No current facility-administered medications for this visit.    Family History  Problem Relation Age of Onset  . Arthritis Mother   . Hypertension Mother   . Hyperlipidemia Mother   . Heart disease Mother 42    dec  . Hypertension Father   . Hyperlipidemia Father   . Heart disease Father 43    smoked for 10 yrs., no muscle damage, 1 MI   . Pulmonary fibrosis Father     diagnosed end stage, cause unkn.  Marland Kitchen Hyperlipidemia Brother   . Diabetes Son   . Asthma Son   . Seizures Son   . Arthritis Maternal Grandmother   . Arthritis Paternal Grandmother   .  Asthma Son     ROS:  Pertinent items are noted in HPI.  Otherwise, a comprehensive ROS was negative.  Exam:   BP 120/82 mmHg  Pulse 76  Resp 16  Ht 5\' 4"  (1.626 m)  Wt 159 lb 12.8 oz (72.485 kg)  BMI 27.42 kg/m2      Height: 5\' 4"  (162.6 cm)  Ht Readings from Last 3 Encounters:  06/10/14 5\' 4"  (1.626 m)  03/16/14 5\' 4"  (1.626 m)  02/03/14 5\' 4"  (1.626 m)    General appearance: alert, cooperative and appears stated age Head: Normocephalic, without obvious abnormality, atraumatic Neck: no adenopathy, supple, symmetrical, trachea midline and thyroid normal to inspection and palpation Lungs: clear to auscultation bilaterally Breasts: normal appearance, no masses or tenderness, Inspection negative, No nipple retraction or dimpling, No nipple discharge or  bleeding, No axillary or supraclavicular adenopathy Heart: regular rate and rhythm Abdomen: soft, non-tender; bowel sounds normal; no masses,  no organomegaly Extremities: extremities normal, atraumatic, no cyanosis or edema Skin: Skin color, texture, turgor normal. No rashes or lesions Lymph nodes: Cervical, supraclavicular, and axillary nodes normal. No abnormal inguinal nodes palpated Neurologic: Grossly normal   Pelvic: External genitalia:  no lesions              Urethra:  normal appearing urethra with no masses, tenderness or lesions              Bartholins and Skenes: normal                 Vagina: normal appearing vagina with normal color and discharge, no lesions              Cervix: absent              Pap taken: Yes.   Bimanual Exam:  Uterus:  uterus absent              Adnexa: normal adnexa and no mass, fullness, tenderness               Rectovaginal: Confirms               Anus:  normal sphincter tone, no lesions  A:  Well Woman with normal exam Status post anterior and posterior colporrhaphy, sacrospinous fixation, and TVT/cystoscopy 11/10/13. Osteopenia.   P:   Mammogram scheduled.  pap smear not indicated.  Encouraged to run the [place] for fight against breast cancer.  Will check on bone density through PCP who she believes has the report.  return annually or prn  An After Visit Summary was printed and given to the patient.

## 2014-06-10 NOTE — Telephone Encounter (Signed)
Yes, TY.

## 2014-06-11 ENCOUNTER — Telehealth: Payer: Self-pay | Admitting: Nurse Practitioner

## 2014-06-11 LAB — LDL CHOLESTEROL, DIRECT: Direct LDL: 176 mg/dL

## 2014-06-11 NOTE — Telephone Encounter (Signed)
Patient notified of results. Patient wants to know when she needs to come for f/u ov and f/u labs?

## 2014-06-11 NOTE — Telephone Encounter (Signed)
If she is seeing gyn for paps, see me in 6 mos.

## 2014-06-11 NOTE — Telephone Encounter (Signed)
Patient notified

## 2014-06-11 NOTE — Telephone Encounter (Signed)
pls call pt: Advise Triglycerides have slightly increased-248 & good cholesterol has gone down. Does need statin, but continue to cut out sugar & refined grains. Exercise -30 minutes daily will increase good cholesterol.

## 2014-06-14 ENCOUNTER — Ambulatory Visit (HOSPITAL_BASED_OUTPATIENT_CLINIC_OR_DEPARTMENT_OTHER)
Admission: RE | Admit: 2014-06-14 | Discharge: 2014-06-14 | Disposition: A | Discharge status: Home / Self Care | Payer: PRIVATE HEALTH INSURANCE | Source: Ambulatory Visit | Attending: Nurse Practitioner | Admitting: Nurse Practitioner

## 2014-06-14 DIAGNOSIS — Z1231 Encounter for screening mammogram for malignant neoplasm of breast: Secondary | ICD-10-CM | POA: Diagnosis present

## 2014-06-25 ENCOUNTER — Encounter: Payer: Self-pay | Admitting: Nurse Practitioner

## 2014-06-27 ENCOUNTER — Other Ambulatory Visit: Payer: Self-pay | Admitting: Nurse Practitioner

## 2014-06-28 ENCOUNTER — Other Ambulatory Visit: Payer: Self-pay

## 2014-06-28 DIAGNOSIS — G4700 Insomnia, unspecified: Secondary | ICD-10-CM

## 2014-06-28 MED ORDER — ZOLPIDEM TARTRATE 10 MG PO TABS: 10.0000 mg | ORAL_TABLET | Freq: Every evening | ORAL | Status: DC | PRN

## 2014-06-28 NOTE — Telephone Encounter (Signed)
[facility] requesting refill on Ambien 10 mg #30. Last OV 03/16/2014. Last refill 05/05/2014

## 2014-08-25 ENCOUNTER — Other Ambulatory Visit: Payer: Self-pay | Admitting: *Deleted

## 2014-08-25 DIAGNOSIS — G4700 Insomnia, unspecified: Secondary | ICD-10-CM

## 2014-08-25 MED ORDER — ZOLPIDEM TARTRATE 10 MG PO TABS: 10.0000 mg | ORAL_TABLET | Freq: Every evening | ORAL | Status: DC | PRN

## 2014-08-25 NOTE — Telephone Encounter (Signed)
Refill request for zolpidem Last filled by MD on- 06/28/14 #30 x1 Last Appt; 03/16/2014 Next Appt: none Please advise refill?

## 2014-08-25 NOTE — Telephone Encounter (Signed)
i will fill ambien. pls call her & schedule f/u apt by April.

## 2014-11-22 ENCOUNTER — Ambulatory Visit (INDEPENDENT_AMBULATORY_CARE_PROVIDER_SITE_OTHER): Payer: PRIVATE HEALTH INSURANCE | Admitting: Nurse Practitioner

## 2014-11-22 ENCOUNTER — Encounter: Payer: Self-pay | Admitting: Nurse Practitioner

## 2014-11-22 VITALS — BP 116/76 | HR 78 | Temp 98.0°F | Ht 64.0 in | Wt 162.0 lb

## 2014-11-22 DIAGNOSIS — E559 Vitamin D deficiency, unspecified: Secondary | ICD-10-CM | POA: Diagnosis not present

## 2014-11-22 DIAGNOSIS — R7309 Other abnormal glucose: Secondary | ICD-10-CM

## 2014-11-22 DIAGNOSIS — G4700 Insomnia, unspecified: Secondary | ICD-10-CM

## 2014-11-22 DIAGNOSIS — E785 Hyperlipidemia, unspecified: Secondary | ICD-10-CM | POA: Diagnosis not present

## 2014-11-22 DIAGNOSIS — R7303 Prediabetes: Secondary | ICD-10-CM

## 2014-11-22 MED ORDER — ZOLPIDEM TARTRATE 10 MG PO TABS: 10.0000 mg | ORAL_TABLET | Freq: Every evening | ORAL | Status: DC | PRN

## 2014-11-22 NOTE — Progress Notes (Signed)
Pre visit review using our clinic review tool, if applicable. No additional management support is needed unless otherwise documented below in the visit note. 

## 2014-11-22 NOTE — Patient Instructions (Signed)
Continue to cut back refined sugar:anything that is sweet when you eat or drink it except fresh fruit.  Cut out Rieser bread, rolls, biscuits, bagels, muffins, pasta and cereals. Breads & cereals that have 4 gm or more of fiber per serving are good. Whole wheat pasta, brown rice and quinoa are good.  Great job with exercise! Continue to exercise 5 days week, at least 30 minutes.  Please return for fasting labs. OK to drink water, black coffee, or tea with no sugar or milk.  Nice to see you!

## 2014-11-23 DIAGNOSIS — E785 Hyperlipidemia, unspecified: Secondary | ICD-10-CM | POA: Insufficient documentation

## 2014-11-23 DIAGNOSIS — R7303 Prediabetes: Secondary | ICD-10-CM | POA: Insufficient documentation

## 2014-11-23 DIAGNOSIS — E559 Vitamin D deficiency, unspecified: Secondary | ICD-10-CM | POA: Insufficient documentation

## 2014-11-23 NOTE — Progress Notes (Signed)
Subjective:     [name] is a 54 y.o. female who presents for f/u of insomnia, vit d def, dyslipidemia, shoulder pain, & prediabetes.  insomnia: pt states she has had to use ambien for years or cannot sleep. She uses 10 mg qhs. She has tried 5 mg with unsatisfactory results. She does not nap, she reads before bed, gets sleepy, turns light out, gets to "point of sleep, then it's like something snaps & I'm wide awake". 10 mg Lorrin Mais works well foe her, but she would like to not have to take meds to sleep. SHe has not tried to sleep without ambien for more than 2 or 3 consecutive nights. vit d def: taking daily supplement w/out intol SE. Last level 31. Goal is 50. Discussed benefits of maintaining therapeutic levels. Dyslipidemia: Improvement in triglycerides w/cutting back sugar.Still not under 150. LDL over 150. Exercising 3-4 times/week. Discussed importance of continuiing diet changes. Discussed benefit of statin if lifestyle interventions not enough to decrease risk. L shoulder pain: improved w/exercises. Does not wish to see orthopedist Prediabetes: Last A1c 12/2012 was 6.0. She enjoys sweet food, but has cut back. Discussed benfits of minimizing sugar in diet.  Prev care: [facility] & [facility] scans w/Dr Quincy Simmonds.  The following portions of the patient's history were reviewed and updated as appropriate: allergies, current medications, past medical history, past social history, past surgical history and problem list.  Review of Systems Pertinent items are noted in HPI.    Objective:    BP 116/76 mmHg  Pulse 78  Temp(Src) 98 F (36.7 C) (Oral)  Ht 5\' 4"  (1.626 m)  Wt 162 lb (73.483 kg)  BMI 27.79 kg/m2  SpO2 97% BP 116/76 mmHg  Pulse 78  Temp(Src) 98 F (36.7 C) (Oral)  Ht 5\' 4"  (1.626 m)  Wt 162 lb (73.483 kg)  BMI 27.79 kg/m2  SpO2 97% General appearance: alert, cooperative, appears stated age and no distress Head: Normocephalic, without obvious abnormality, atraumatic Eyes:  negative findings: lids and lashes normal, conjunctivae and sclerae normal and wearing glasses Neck: no adenopathy, no carotid bruit, supple, symmetrical, trachea midline and thyroid not enlarged, symmetric, no tenderness/mass/nodules Lungs: clear to auscultation bilaterally Heart: regular rate and rhythm, S1, S2 normal, no murmur, click, rub or gallop Extremities: extremities normal, atraumatic, no cyanosis or edema Pulses: 2+ and symmetric Neurologic: Grossly normal    Assessment:Plan     1. Insomnia Continue. - zolpidem (AMBIEN) 10 MG tablet; Take 1 tablet (10 mg total) by mouth at bedtime as needed for sleep.  Dispense: 30 tablet; Refill: 2 - CBC; Future - Ambulatory referral to Neurology  2. Prediabetes Last A1C 6.0, continue diet changes - Hemoglobin A1c; Future  3. Dyslipidemia Triglycerides high, LDL high.  Cont diet changes & exercise. Discussed statin if lifestyle is not enough to normalize risk. - Comprehensive metabolic panel; Future - Lipid panel; Future  4. Vitamin D deficiency - Vit D  25 hydroxy (rtn osteoporosis monitoring); Future  F/u 3 mos

## 2014-11-24 ENCOUNTER — Other Ambulatory Visit (INDEPENDENT_AMBULATORY_CARE_PROVIDER_SITE_OTHER): Payer: PRIVATE HEALTH INSURANCE

## 2014-11-24 DIAGNOSIS — R7309 Other abnormal glucose: Secondary | ICD-10-CM

## 2014-11-24 DIAGNOSIS — G4700 Insomnia, unspecified: Secondary | ICD-10-CM

## 2014-11-24 DIAGNOSIS — E785 Hyperlipidemia, unspecified: Secondary | ICD-10-CM | POA: Diagnosis not present

## 2014-11-24 DIAGNOSIS — R7303 Prediabetes: Secondary | ICD-10-CM

## 2014-11-24 DIAGNOSIS — E559 Vitamin D deficiency, unspecified: Secondary | ICD-10-CM

## 2014-11-24 LAB — HEMOGLOBIN A1C: Hgb A1c MFr Bld: 5.4 % (ref 4.6–6.5)

## 2014-11-24 LAB — CBC
HEMATOCRIT: 37.2 % (ref 36.0–46.0)
HEMOGLOBIN: 12.6 g/dL (ref 12.0–15.0)
MCHC: 34 g/dL (ref 30.0–36.0)
MCV: 87.2 fl (ref 78.0–100.0)
PLATELETS: 295 10*3/uL (ref 150.0–400.0)
RBC: 4.26 Mil/uL (ref 3.87–5.11)
RDW: 13.8 % (ref 11.5–15.5)
WBC: 5.6 10*3/uL (ref 4.0–10.5)

## 2014-11-24 LAB — COMPREHENSIVE METABOLIC PANEL
ALK PHOS: 70 U/L (ref 39–117)
ALT: 33 U/L (ref 0–35)
AST: 25 U/L (ref 0–37)
Albumin: 4.2 g/dL (ref 3.5–5.2)
BILIRUBIN TOTAL: 0.4 mg/dL (ref 0.2–1.2)
BUN: 14 mg/dL (ref 6–23)
CO2: 29 mEq/L (ref 19–32)
Calcium: 9.5 mg/dL (ref 8.4–10.5)
Chloride: 105 mEq/L (ref 96–112)
Creatinine, Ser: 0.81 mg/dL (ref 0.40–1.20)
GFR: 77.89 mL/min (ref >60.00)
Glucose, Bld: 84 mg/dL (ref 70–99)
Potassium: 4 mEq/L (ref 3.5–5.1)
SODIUM: 139 meq/L (ref 135–145)
TOTAL PROTEIN: 6.6 g/dL (ref 6.0–8.3)

## 2014-11-24 LAB — LIPID PANEL
Cholesterol: 229 mg/dL — ABNORMAL HIGH (ref 0–200)
HDL: 48.2 mg/dL (ref >39.00)
NONHDL: 180.8
Total CHOL/HDL Ratio: 5
Triglycerides: 229 mg/dL — ABNORMAL HIGH (ref 0.0–149.0)
VLDL: 45.8 mg/dL — ABNORMAL HIGH (ref 0.0–40.0)

## 2014-11-24 LAB — VITAMIN D 25 HYDROXY (VIT D DEFICIENCY, FRACTURES): VITD: 35.81 ng/mL (ref 30.00–100.00)

## 2014-11-24 LAB — LDL CHOLESTEROL, DIRECT: LDL DIRECT: 141 mg/dL

## 2014-11-25 ENCOUNTER — Telehealth: Payer: Self-pay | Admitting: Nurse Practitioner

## 2014-11-25 ENCOUNTER — Encounter: Payer: Self-pay | Admitting: Nurse Practitioner

## 2014-11-25 DIAGNOSIS — E559 Vitamin D deficiency, unspecified: Secondary | ICD-10-CM

## 2014-11-25 MED ORDER — VITAMIN D3 1.25 MG (50000 UT) PO CAPS: 1.0000 | ORAL_CAPSULE | ORAL | Status: DC

## 2014-11-25 NOTE — Telephone Encounter (Signed)
LMOVM-identified about lab results. Patient to call back with any questions or concerns. DPR Signed.  

## 2014-11-25 NOTE — Telephone Encounter (Signed)
pls call pt: Advise labs look great!  No longer prediabetic! A1C has normalized with diet changes. Now 5.4 from 6.0!! Cholesterol panel still shows triglycerides elevated, but I'm not concerned given A1C has improved. Current evidence indicates no benefit to taking statin with current numbers.   Only concern is vit D still low. Start prescription. Take for 12 weeks. Pls sched lab appt in 12 weeks. Next OV in 1 yr.

## 2015-01-06 ENCOUNTER — Ambulatory Visit (INDEPENDENT_AMBULATORY_CARE_PROVIDER_SITE_OTHER): Payer: PRIVATE HEALTH INSURANCE | Admitting: Neurology

## 2015-01-06 ENCOUNTER — Encounter: Payer: Self-pay | Admitting: Neurology

## 2015-01-06 VITALS — BP 126/81 | HR 78 | Resp 14 | Ht 64.0 in | Wt 160.0 lb

## 2015-01-06 DIAGNOSIS — E559 Vitamin D deficiency, unspecified: Secondary | ICD-10-CM | POA: Diagnosis not present

## 2015-01-06 DIAGNOSIS — G4700 Insomnia, unspecified: Secondary | ICD-10-CM | POA: Diagnosis not present

## 2015-01-06 DIAGNOSIS — F5104 Psychophysiologic insomnia: Secondary | ICD-10-CM

## 2015-01-06 NOTE — Patient Instructions (Signed)
Please remember to try to maintain good sleep hygiene, which means: Keep a regular sleep and wake schedule, try not to exercise or have a meal within 2 hours of your bedtime, try to keep your bedroom conducive for sleep, that is, cool and dark, without light distractors such as an illuminated alarm clock, and refrain from watching TV right before sleep or in the middle of the night and do not keep the TV or radio on during the night. Also, try not to use or play on electronic devices at bedtime, such as your cell phone, tablet PC or laptop. If you like to read at bedtime on an electronic device, try to dim the background light as much as possible. Do not eat in the middle of the night.   I will make some recommendations to your primary care doctor. You may want to consider cognitive behavioral therapy with a psychologist, talk to your PCP about it and perhaps alternating another sleep aid in low dose, in order to not rely on just one thing.   I do not see a pressing reason to do a sleep study at this time.  I will see you back as needed.

## 2015-01-06 NOTE — Progress Notes (Addendum)
Subjective:    Patient ID: [name] is a 54 y.o. female.  HPI     Star Age, MD, PhD [facility] [address], [address].O. [place], [place] [address]  Dear Janann August,   I saw your patient, [name], upon your kind request in my neurologic clinic today for initial consultation of her sleep disturbance, in particular difficulty with sleep maintenance. The patient is unaccompanied today. As you know, [name] is a 54 year old right-handed woman with an underlying medical history of vitamin D deficiency, hyperlipidemia, shoulder pain, prediabetes and overweight state, who reports a 6-8 year history of difficulty initiating sleep. Her family doctor in [place], [place] had tried her on 2 other prescription medications for sleep but she does not remember exactly which ones. One medication caused her severe mouth dryness and she does recall it could be amitriptyline, when I asked her about it. She has been on Ambien for the past 6-8 years. She takes 10 mg each night at this point. She reports she cannot sleep without it. She denies any overt mood disorder but has had stress in the past 2 years. She moved with her family from [place], [place] to [place] about 2 years ago. In 06/15/13 her father passed away and in February 12, 2014 her mother passed away. She is a retired Automotive engineer. She is a nonsmoker and drinks alcohol very occasionally and she has eliminated caffeine. She goes to the gym 4 times a week and walks almost every day in the evenings. She has mild snoring only when she sleeps on her back. Her husband has never noted any pauses in her breathing and she denies waking up with a gasping sensation or panic. She does not have any nighttime anxiety. She likes to read on her Kindle at night. She takes her Ambien between 10 and 11 PM. Bedtime is between 10 and 11 PM. Once she is asleep she does not have significant difficulty  maintaining sleep. She may have nocturia once per night but can go back to sleep. She denies restless leg symptoms or twitching or kicking in her sleep. She does not have any other pain at night. She denies in particular back pain or joint pain. She denies carpal tunnel symptoms. She denies recurrent headaches and in particular morning headaches. She has had very rare nighttime reflux symptoms and has taken over-the-counter Zantac for this. She occasionally watches TV in the bedroom but turns her TV off once she takes the Ambien. She has not noticed any difficulty driving the next day or any residual drowsiness the next day.  She reports no family history of OSA, restless leg syndrome, or insomnia. Her Epworth sleepiness score is 2 out of 24 today and her fatigue score is 19 out of 63.  Her Past Medical History Is Significant For: Past Medical History  Diagnosis Date  . Insomnia     started ambien 5 ya.  . Diverticulosis of colon   . History of rectocele   . Periodic heart flutter   . Urinary incontinence   . SVD (spontaneous vaginal delivery)     x 2  . Arthritis     neck  . Osteopenia     --left femur    Her Past Surgical History Is Significant For: Past Surgical History  Procedure Laterality Date  . Vaginal hysterectomy  11/11     Vag cuff, mild rectocele  . Wisdom tooth extraction    . Colonoscopy    .  Bladder suspension N/A 11/10/2013    Procedure: TRANSVAGINAL TAPE (TVT) PROCEDURE;  Surgeon: Jamey Reas de Berton Lan, MD;  Location: [facility];  Service: Gynecology;  Laterality: N/A;  . Cystoscopy N/A 11/10/2013    Procedure: CYSTOSCOPY;  Surgeon: Jamey Reas de Berton Lan, MD;  Location: [facility];  Service: Gynecology;  Laterality: N/A;    Her Family History Is Significant For: Family History  Problem Relation Age of Onset  . Arthritis Mother   . Hypertension Mother   . Hyperlipidemia Mother   . Heart disease Mother 62    dec  . Hypertension Father   .  Hyperlipidemia Father   . Heart disease Father 70    smoked for 10 yrs., no muscle damage, 1 MI   . Pulmonary fibrosis Father     diagnosed end stage, cause unkn.  Marland Kitchen Hyperlipidemia Brother   . Diabetes Son   . Asthma Son   . Seizures Son   . Arthritis Maternal Grandmother   . Arthritis Paternal Grandmother   . Asthma Son     Her Social History Is Significant For: History   Social History  . Marital Status: Married    Spouse Name: Phillip Heal  . Number of Children: 2  . Years of Education: Masters   Occupational History  . teacher, reading specialist k-6     Retired   Social History Main Topics  . Smoking status: Never Smoker   . Smokeless tobacco: Never Used  . Alcohol Use: 0.0 oz/week    0 Standard drinks or equivalent per week     Comment: ocassional wine  . Drug Use: No  . Sexual Activity:    Partners: Male    Birth Control/ Protection: Surgical     Comment: hysterectomy--still has ovaries   Other Topics Concern  . None   Social History Narrative   Relocated to [place] from [place]. Due to husband job transfer. 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 She lives with her husband. She has 2 grown sons- oldest recently graduated from college and took a job in [place].; youngest just graduated HS & ia attending [place].      Has had regular preventive care in [place]. At [facility] w/Dr. Alfonse Spruce. Only health concern is insomnia that developed about 5 years ago & has been treated with [place]. She has not practiced sleep hygiene.      Occasionally drinks sweet tea.     Her Allergies Are:  Allergies  Allergen Reactions  . Amoxicillin Hives  . Sulfa Antibiotics Rash    Childhood rxn  :   Her Current Medications Are:  Outpatient Encounter Prescriptions as of 01/06/2015  Medication Sig  . Biotin 10 MG CAPS Take 1 capsule by mouth daily.  . Cholecalciferol (VITAMIN D3) 50000 UNITS CAPS Take 1  capsule by mouth every 7 (seven) days.  Marland Kitchen KRILL OIL PO Take 1 capsule by mouth daily.  Marland Kitchen zolpidem (AMBIEN) 10 MG tablet Take 1 tablet (10 mg total) by mouth at bedtime as needed for sleep.   No facility-administered encounter medications on file as of 01/06/2015.  :  Review of Systems:  Out of a complete 14 point review of systems, all are reviewed and negative with the exception of these symptoms as listed below:  Review of Systems  Neurological:       Insomnia, patient reports that she  cannot sleep without sleep aid, no trouble staying asleep on ambien, snoring, no witnessed apnea, wakes up rested in morning after taking ambien, no daytime tiredness, denies taking naps.     Objective:  Neurologic Exam  Physical Exam Physical Examination:   Filed Vitals:   01/06/15 0919  BP: 126/81  Pulse: 78  Resp: 14    General Examination: The patient is a very pleasant 54 y.o. female in no acute distress. She appears well-developed and well-nourished and well groomed.   HEENT: Normocephalic, atraumatic, pupils are equal, round and reactive to light and accommodation. Funduscopic exam is normal with sharp disc margins noted. Extraocular tracking is good without limitation to gaze excursion or nystagmus noted. Normal smooth pursuit is noted. Hearing is grossly intact. Tympanic membranes are clear bilaterally. Face is symmetric with normal facial animation and normal facial sensation. Speech is clear with no dysarthria noted. There is no hypophonia. There is no lip, neck/head, jaw or voice tremor. Neck is supple with full range of passive and active motion. There are no carotid bruits on auscultation. Oropharynx exam reveals: mild mouth dryness, good dental hygiene and mild airway crowding, due to narrow airway entry. Tonsils are small. Mallampati is class I. Tongue protrudes centrally and palate elevates symmetrically.   Chest: Clear to auscultation without wheezing, rhonchi or crackles  noted.  Heart: S1+S2+0, regular and normal without murmurs, rubs or gallops noted.   Abdomen: Soft, non-tender and non-distended with normal bowel sounds appreciated on auscultation.  Extremities: There is no pitting edema in the distal lower extremities bilaterally. Pedal pulses are intact.  Skin: Warm and dry without trophic changes noted. There are no varicose veins.  Musculoskeletal: exam reveals no obvious joint deformities, tenderness or joint swelling or erythema.   Neurologically:  Mental status: The patient is awake, alert and oriented in all 4 spheres. Her immediate and remote memory, attention, language skills and fund of knowledge are appropriate. There is no evidence of aphasia, agnosia, apraxia or anomia. Speech is clear with normal prosody and enunciation. Thought process is linear. Mood is normal and affect is normal.  Cranial nerves II - XII are as described above under HEENT exam. In addition: shoulder shrug is normal with equal shoulder height noted. Motor exam: Normal bulk, strength and tone is noted. There is no drift, tremor or rebound. Romberg is negative. Reflexes are 2+ throughout. Babinski: Toes are flexor bilaterally. Fine motor skills and coordination: intact with normal finger taps, normal hand movements, normal rapid alternating patting, normal foot taps and normal foot agility.  Cerebellar testing: No dysmetria or intention tremor on finger to nose testing. Heel to shin is unremarkable bilaterally. There is no truncal or gait ataxia.  Sensory exam: intact to light touch, pinprick, vibration, temperature sense in the upper and lower extremities.  Gait, station and balance: She stands easily. No veering to one side is noted. No leaning to one side is noted. Posture is age-appropriate and stance is narrow based. Gait shows normal stride length and normal pace. No problems turning are noted. She turns en bloc. Tandem walk is unremarkable.  Assessment and plan:   In  summary, [name] is a very pleasant 54 y.o.-year old female with an underlying medical history of vitamin D deficiency, hyperlipidemia, shoulder pain, prediabetes and overweight state, who reports a 6-8 year history of difficulty initiating sleep.her history and physical exam does not suggest any underlying organic sleep disorder. Chronic insomnia is best managed with the help of primary care physician  and sometimes with the help of psychiatry or psychology. I would recommend that the patient discuss with you the possibility of pursuing cognitive behavioral therapy as a nonpharmacological approach to chronic insomnia treatment. Furthermore, she has had some stress in the past couple of years. She may benefit from consultation with a psychiatrist. She is encouraged to bring this up with use well. We talked about good sleep hygiene. She can also consider alternating Ambien with another medication such as Lunesta, or trazodone, and consider skipping a sleeping pill every third or fourth night if possible. Thankfully, her general physical exam and neurological exam in particular are nonfocal and she was reassured in that regard. At this juncture, she is advised to follow-up with you and discuss above issues. I can see her back on an as-needed basis. If she should develop any significant concern for sleep-disordered breathing or restless leg syndrome with bothersome leg twitching at night we can certainly pursue a sleep study. At this moment, I do not see a pressing reason to pursue an overnight polysomnogram. I discussed this at length with the patient and she was in agreement.   Thank you very much for allowing me to participate in the care of this nice patient. If I can be of any further assistance to you please do not hesitate to call me at [phone].  Sincerely,   Star Age, MD, PhD

## 2015-02-08 ENCOUNTER — Encounter: Payer: Self-pay | Admitting: Nurse Practitioner

## 2015-02-08 ENCOUNTER — Ambulatory Visit (INDEPENDENT_AMBULATORY_CARE_PROVIDER_SITE_OTHER): Payer: PRIVATE HEALTH INSURANCE | Admitting: Nurse Practitioner

## 2015-02-08 ENCOUNTER — Telehealth: Payer: Self-pay | Admitting: Nurse Practitioner

## 2015-02-08 VITALS — BP 125/82 | HR 84 | Temp 98.1°F | Resp 16 | Wt 161.0 lb

## 2015-02-08 DIAGNOSIS — R319 Hematuria, unspecified: Secondary | ICD-10-CM | POA: Diagnosis not present

## 2015-02-08 DIAGNOSIS — E559 Vitamin D deficiency, unspecified: Secondary | ICD-10-CM

## 2015-02-08 DIAGNOSIS — G4700 Insomnia, unspecified: Secondary | ICD-10-CM | POA: Diagnosis not present

## 2015-02-08 LAB — URINALYSIS, MICROSCOPIC ONLY

## 2015-02-08 LAB — POCT URINALYSIS DIPSTICK
BILIRUBIN UA: NEGATIVE
Glucose, UA: NEGATIVE
KETONES UA: NEGATIVE
Leukocytes, UA: NEGATIVE
Nitrite, UA: NEGATIVE
PROTEIN UA: NEGATIVE
Spec Grav, UA: >=1.03
UROBILINOGEN UA: 0.2
pH, UA: 5.5

## 2015-02-08 LAB — VITAMIN D 25 HYDROXY (VIT D DEFICIENCY, FRACTURES): VITD: 71.96 ng/mL (ref 30.00–100.00)

## 2015-02-08 MED ORDER — ZOLPIDEM TARTRATE 10 MG PO TABS: 10.0000 mg | ORAL_TABLET | Freq: Every evening | ORAL | Status: DC | PRN

## 2015-02-08 MED ORDER — TRAZODONE HCL 100 MG PO TABS: ORAL_TABLET | ORAL | Status: DC

## 2015-02-08 NOTE — Patient Instructions (Addendum)
My office will call with lab results and follow up.  Consider cognitive behavioral therapy to help with insomnia.  Consider weaning from [place], or using only a few times weekly if you experience symptoms of sleep deprivation. Alternatively, you may alternate trazodone and ambien with nights that you take nothing. Do not take trazodone and ambien together. Now is a great time to explore how your body will respond to no medicine. It will take 5 to 10 days to get past rebound insomnia once you stop medications.   Keep up great work with activity & diet changes to maintain best health & keep risk for chronic disease low.   Plan to get flu shots in August and have a physical next Spring.   It has been a pleasure to partner with you in your health care!

## 2015-02-08 NOTE — Progress Notes (Addendum)
Subjective:     [name] is a 54 y.o. female presents for f/u insomnia, vitamin D deficiency and new c/o hematuria. Insomnia: reviewed neurology OV notes: pt does not have organic sleep disorder; Recommend alternating meds, CBT. Discussed long and short -term goals regarding sleep. Pt is retired now so it is a good time to wean from meds & see how her body responds & if she has symptoms of sleep deprivation. Her Biggest concern: she fears she will have symptoms.  Discussed alternating ambien and trazodone. Discussed differences of meds & potential SE. Have booklet on "healthy Sleep" & sleep hygiene. Hematuria: onset 6 wks ago. Intermittent. only seen in 1st am urine. Denies flank pain, abdominal pain, dysuria, hesitancy, urgency, vag d/c, malodorous urine, fever. No Hx of kidney stones, but father & brother had them. She reports falling down set of steps 3 mos ago & wonders if related-not likely, given hematuria began 6 weeks after & she is not experiencing ongoing pain or injury from fall. She had cystocele repair 1 yr ago.  Vit D def: just finished prescription strength D. No intol SE. Will check level today.   The following portions of the patient's history were reviewed and updated as appropriate: allergies, current medications, past family history, past medical history, past social history, past surgical history and problem list.  Review of Systems Pertinent items are noted in HPI.    Objective:    BP 125/82 mmHg  Pulse 84  Temp(Src) 98.1 F (36.7 C) (Oral)  Resp 16  Wt 161 lb (73.029 kg)  SpO2 96% General appearance: alert, cooperative, appears stated age and no distress Eyes: negative findings: lids and lashes normal, conjunctivae and sclerae normal and wearing glasses Back: no CVA tenderness Abdomen: soft, non-tender; bowel sounds normal; no masses,  no organomegaly Neurologic: Grossly normal    Assessment:Plan     1. Vitamin D deficiency Repeat level - Vit D  25  hydroxy (rtn osteoporosis monitoring)  2. Hematuria new - POCT urinalysis dipstick-lyced trace blood, SG 1.030 - Urine culture - Urine Microscopic  3. Insomnia start - traZODone (DESYREL) 100 MG tablet; Take 1/2 to 1 T PO QHS PRN sleep  Dispense: 20 tablet; Refill: 3 Alternate - zolpidem (AMBIEN) 10 MG tablet; Take 1 tablet (10 mg total) by mouth at bedtime as needed for sleep.  Dispense: 20 tablet; Refill: 3  F/u 6 mo-insomnia

## 2015-02-08 NOTE — Telephone Encounter (Signed)
pls call pt: Advise Vitamin D therapeutic. Start maintenance dose of D3 1000 iu daily.

## 2015-02-08 NOTE — Progress Notes (Signed)
Pre visit review using our clinic review tool, if applicable. No additional management support is needed unless otherwise documented below in the visit note. 

## 2015-02-09 NOTE — Telephone Encounter (Signed)
Patient aware.

## 2015-02-11 ENCOUNTER — Other Ambulatory Visit: Payer: Self-pay | Admitting: Family Medicine

## 2015-02-11 ENCOUNTER — Telehealth: Payer: Self-pay | Admitting: Nurse Practitioner

## 2015-02-11 DIAGNOSIS — R319 Hematuria, unspecified: Secondary | ICD-10-CM

## 2015-02-11 LAB — URINE CULTURE: Colony Count: 15000

## 2015-02-11 NOTE — Telephone Encounter (Signed)
pls call pt: Advise Urine specimen appears contaminated. Suggest recollection with scrupulous effort not to touch inside of lid or container, etc.

## 2015-02-11 NOTE — Addendum Note (Signed)
Addended by: Ralph Dowdy on: 02/11/2015 12:50 PM   Modules accepted: Orders

## 2015-02-11 NOTE — Telephone Encounter (Signed)
Patient will come back today to recollect.

## 2015-02-13 LAB — URINE CULTURE: Colony Count: 30000

## 2015-02-14 ENCOUNTER — Other Ambulatory Visit: Payer: Self-pay | Admitting: Nurse Practitioner

## 2015-02-14 ENCOUNTER — Encounter: Payer: Self-pay | Admitting: Nurse Practitioner

## 2015-02-14 DIAGNOSIS — E559 Vitamin D deficiency, unspecified: Secondary | ICD-10-CM

## 2015-02-14 DIAGNOSIS — R319 Hematuria, unspecified: Secondary | ICD-10-CM

## 2015-02-14 MED ORDER — CIPROFLOXACIN HCL 250 MG PO TABS: 250.0000 mg | ORAL_TABLET | Freq: Two times a day (BID) | ORAL | Status: DC

## 2015-02-16 ENCOUNTER — Other Ambulatory Visit: Payer: Self-pay | Admitting: Family Medicine

## 2015-02-16 DIAGNOSIS — G4700 Insomnia, unspecified: Secondary | ICD-10-CM

## 2015-02-16 NOTE — Telephone Encounter (Signed)
Refill request from pharmacy for pt's ambien.  This rx was printed 02/08/15 when patient was in office.  Pt must have taken paper rx with her.

## 2015-02-18 ENCOUNTER — Other Ambulatory Visit: Payer: Self-pay | Admitting: Family Medicine

## 2015-02-18 DIAGNOSIS — G4700 Insomnia, unspecified: Secondary | ICD-10-CM

## 2015-02-18 MED ORDER — ZOLPIDEM TARTRATE 10 MG PO TABS: 10.0000 mg | ORAL_TABLET | Freq: Every evening | ORAL | Status: DC | PRN

## 2015-03-29 ENCOUNTER — Encounter: Payer: Self-pay | Admitting: Family Medicine

## 2015-03-29 ENCOUNTER — Ambulatory Visit (INDEPENDENT_AMBULATORY_CARE_PROVIDER_SITE_OTHER): Payer: PRIVATE HEALTH INSURANCE | Admitting: Family Medicine

## 2015-03-29 VITALS — BP 136/84 | HR 73 | Temp 98.1°F | Resp 18 | Wt 163.8 lb

## 2015-03-29 DIAGNOSIS — G5620 Lesion of ulnar nerve, unspecified upper limb: Secondary | ICD-10-CM

## 2015-03-29 DIAGNOSIS — G5621 Lesion of ulnar nerve, right upper limb: Secondary | ICD-10-CM | POA: Diagnosis not present

## 2015-03-29 HISTORY — DX: Lesion of ulnar nerve, unspecified upper limb: G56.20

## 2015-03-29 MED ORDER — PREDNISONE 20 MG PO TABS: 20.0000 mg | ORAL_TABLET | Freq: Every day | ORAL | Status: DC

## 2015-03-29 MED ORDER — ZOLPIDEM TARTRATE 5 MG PO TABS: 5.0000 mg | ORAL_TABLET | Freq: Every evening | ORAL | Status: DC | PRN

## 2015-03-29 NOTE — Patient Instructions (Signed)
Ulnar neuropathy, Ulnar nerve compression Prednisone burst.  Arm splint. Try not to flex arm or lay on arm.

## 2015-03-29 NOTE — Progress Notes (Signed)
Subjective:    Patient ID: [name], female    DOB: 07-Dec-1960, 54 y.o.   MRN: [id number]  HPI  Right arm pain: Patient presents to acute office visit for a greater than a week history of right forearm discomfort. Patient points to the location of her lateral right elbow, tract to her fourth and fifth finger. Patient states she's never noticed any bruising, any redness or swelling. She doesn't recall any traumatic event. She has never experienced pain like this in her arm before. She states that any use, especially picking up anything causes a shooting pain in the location described above. She has noticed some numbness in her fourth and fifth digit. She reports it has awoken her in the middle of the night with discomfort. She has tried Aleve and Tylenol, and does not feel this has been helpful. She has not tried any over-the-counter splints or bands. She endorses sleeping with a bent elbow tucked underneath her frequently. She has been using the weight machines at her gym, and has noticed some increased discomfort with some of her movements. She has a history of neck arthritis, but denies any upper arm or shoulder pain associated with current issues.   Past Medical History  Diagnosis Date  . Insomnia     started ambien 5 ya.  . Diverticulosis of colon   . History of rectocele   . Periodic heart flutter   . Urinary incontinence   . SVD (spontaneous vaginal delivery)     x 2  . Arthritis     neck  . Osteopenia     --left femur   Allergies  Allergen Reactions  . Amoxicillin Hives  . Sulfa Antibiotics Rash    Childhood rxn   Past Surgical History  Procedure Laterality Date  . Vaginal hysterectomy  11/11     Vag cuff, mild rectocele  . Wisdom tooth extraction    . Colonoscopy    . Bladder suspension N/A 11/10/2013    Procedure: TRANSVAGINAL TAPE (TVT) PROCEDURE;  Surgeon: Jamey Reas de Berton Lan, MD;  Location: [facility];  Service: Gynecology;  Laterality: N/A;  .  Cystoscopy N/A 11/10/2013    Procedure: CYSTOSCOPY;  Surgeon: Jamey Reas de Berton Lan, MD;  Location: [facility];  Service: Gynecology;  Laterality: N/A;   Review of Systems Negative, with the exception of above mentioned in HPI    Objective:   Physical Exam BP 136/84 mmHg  Pulse 73  Temp(Src) 98.1 F (36.7 C) (Oral)  Resp 18  Wt 163 lb 12.8 oz (74.299 kg) Gen: Afebrile. No acute distress. Nontoxic in appearance, well-developed well-nourished Caucasian female. MSK, right arm: No erythema, no soft tissue swelling, no edema. Moderate tenderness to palpation right lateral elbow, over lateral collateral ligament and ulnar nerve. Mild discomfort with resisted flexion, moderate discomfort with resisted extension, moderate disease. Mod-sever pain with  resisted supination. 5/5 bilateral upper extremity strength. Positive Tinel's at elbow. Negative Tinel's at risk. Neurovascularly intact distally.      Assessment & Plan:  1. Ulnar nerve compression, right - Discussed course of ulnar nerve neuropathy/compression pain. Conservative treatment discussed. Patient is to purchase arm sling/band for coniditon, examples printed out for her today.  - Do not use exercise/weight equipment for at least 2 weeks. Attempt to not flex or lay on arm during sleeping hours.  - can use Ice as needed; caution to never place ice on skin.  - D/T chronicity of >8 weeks offered steroid  injection over burst. Pt wanted oral (not diabetic).  - predniSONE (DELTASONE) 20 MG tablet; Take 1 tablet (20 mg total) by mouth daily with breakfast.  Dispense: 5 tablet; Refill: 0 - F/U 4 weeks, consider ortho referral if desired.

## 2015-05-02 ENCOUNTER — Other Ambulatory Visit: Payer: Self-pay | Admitting: Family Medicine

## 2015-05-02 MED ORDER — ZOLPIDEM TARTRATE 5 MG PO TABS: 5.0000 mg | ORAL_TABLET | Freq: Every evening | ORAL | Status: DC | PRN

## 2015-05-02 NOTE — Telephone Encounter (Signed)
Pt called requesting rf of ambien 5mg .  She states that the 5 mg tab is working well.  Please advise rf.  Last RX 03/29/15. Last OV 03/29/15.

## 2015-05-03 NOTE — Telephone Encounter (Signed)
rx printed and signed by Dr. Raliegh Ip.  Faxed to pharmacy.

## 2015-05-06 ENCOUNTER — Other Ambulatory Visit: Payer: Self-pay | Admitting: Obstetrics and Gynecology

## 2015-05-06 DIAGNOSIS — Z1231 Encounter for screening mammogram for malignant neoplasm of breast: Secondary | ICD-10-CM

## 2015-05-11 ENCOUNTER — Telehealth: Payer: Self-pay | Admitting: Obstetrics and Gynecology

## 2015-05-11 DIAGNOSIS — Z780 Asymptomatic menopausal state: Secondary | ICD-10-CM

## 2015-05-11 DIAGNOSIS — M8580 Other specified disorders of bone density and structure, unspecified site: Secondary | ICD-10-CM

## 2015-05-11 DIAGNOSIS — E2839 Other primary ovarian failure: Secondary | ICD-10-CM

## 2015-05-11 NOTE — Telephone Encounter (Signed)
OK to order a bone densitometry for osteopenia.   Thank you.

## 2015-05-11 NOTE — Telephone Encounter (Signed)
Dr. Quincy Simmonds, okay to order DEXA for patient? Last  BMD:2013 osteopenia in left femur:in [place], [place].

## 2015-05-11 NOTE — Telephone Encounter (Signed)
Patient is scheduled for mammogram 06/16/2015 at [facility] at [place]. Patient wants to ask Dr. Quincy Simmonds if she needs a Bone Density done as well, it has been 2 years. If so she needs a order sent to them. Best contact # for patient: [phone]

## 2015-05-12 NOTE — Telephone Encounter (Signed)
Order placed

## 2015-05-12 NOTE — Telephone Encounter (Signed)
Order placed and detailed message left to advise patient that order is placed and she call to schedule bone density at her convenience. Detailed message left on mobile, confirms first name, okay per designated party release form Routing to provider for final review. Patient agreeable to disposition. Will close encounter.

## 2015-06-16 ENCOUNTER — Ambulatory Visit (HOSPITAL_BASED_OUTPATIENT_CLINIC_OR_DEPARTMENT_OTHER): Payer: PRIVATE HEALTH INSURANCE

## 2015-06-20 ENCOUNTER — Ambulatory Visit (HOSPITAL_BASED_OUTPATIENT_CLINIC_OR_DEPARTMENT_OTHER)
Admission: RE | Admit: 2015-06-20 | Discharge: 2015-06-20 | Disposition: A | Discharge status: Home / Self Care | Payer: PRIVATE HEALTH INSURANCE | Source: Ambulatory Visit | Attending: Obstetrics and Gynecology | Admitting: Obstetrics and Gynecology

## 2015-06-20 DIAGNOSIS — M8580 Other specified disorders of bone density and structure, unspecified site: Secondary | ICD-10-CM

## 2015-06-20 DIAGNOSIS — Z780 Asymptomatic menopausal state: Secondary | ICD-10-CM

## 2015-06-20 DIAGNOSIS — E2839 Other primary ovarian failure: Secondary | ICD-10-CM

## 2015-06-20 DIAGNOSIS — Z1231 Encounter for screening mammogram for malignant neoplasm of breast: Secondary | ICD-10-CM

## 2015-06-30 ENCOUNTER — Other Ambulatory Visit: Payer: Self-pay | Admitting: Family Medicine

## 2015-06-30 NOTE — Telephone Encounter (Signed)
Pt requesting rf of ambien.  Pt last OV was 03/29/15.  Pt did not follow up after visit.  Told patient she may need to f/u before receiving rf.  Please advise RF.

## 2015-07-01 ENCOUNTER — Ambulatory Visit (INDEPENDENT_AMBULATORY_CARE_PROVIDER_SITE_OTHER): Payer: PRIVATE HEALTH INSURANCE | Admitting: Family Medicine

## 2015-07-01 ENCOUNTER — Encounter: Payer: Self-pay | Admitting: Family Medicine

## 2015-07-01 VITALS — BP 144/91 | HR 76 | Temp 98.2°F | Resp 14 | Ht 64.0 in | Wt 169.0 lb

## 2015-07-01 DIAGNOSIS — G5621 Lesion of ulnar nerve, right upper limb: Secondary | ICD-10-CM

## 2015-07-01 DIAGNOSIS — G4700 Insomnia, unspecified: Secondary | ICD-10-CM | POA: Diagnosis not present

## 2015-07-01 MED ORDER — ZOLPIDEM TARTRATE 5 MG PO TABS: 5.0000 mg | ORAL_TABLET | Freq: Every evening | ORAL | Status: DC | PRN

## 2015-07-01 NOTE — Telephone Encounter (Signed)
Patient scheduled appointment for today at 1:30pm.

## 2015-07-01 NOTE — Progress Notes (Signed)
Subjective:    Patient ID: [name], female    DOB: 23-Dec-1960, 54 y.o.   MRN: [id number]  HPI   Ulnar nerve impingement: Patient returns to office today for follow-up on her ulnar nerve impingement. She states she has been wearing the arm band and she completed steroid course with complete resolution of symptoms. Patient states she occasionally will have mild repeat symptoms in which she start using the arm band again and they normally resolve on their own. She has noticed with ordering her Christmas tree some of her symptoms recur. Currently she is very pleased with results/resolutions of her symptoms.  Insomnia: Patient has a long-standing history of insomnia in which she has seen neurology and sleep studies. The past she has been on trazodone and Ambien with mixed results. She felt overmedicated on trazodone. She felt sedated/sleepy with Ambien 10 mg. Since she has been using Ambien 5 mg, she does endorse needing this nightly to follow sleep. She states with a 5 mg dose of Ambien she does wake up at 7:30 every morning and feels like she has had a restful sleep. She has good sleep hygiene, goes to bed approximately 10:30 11:00 every night. She states she does not take her Ambien she cannot fall asleep for hours. She denies any negative side effects to Ambien.  Never smoker  Past Medical History  Diagnosis Date  . Insomnia     started ambien 5 ya.  . Diverticulosis of colon   . History of rectocele   . Periodic heart flutter ([place])   . Urinary incontinence   . SVD (spontaneous vaginal delivery)     x 2  . Arthritis     neck  . Osteopenia     --left femur     Allergies  Allergen Reactions  . Amoxicillin Hives  . Sulfa Antibiotics Rash    Childhood rxn   Past Surgical History  Procedure Laterality Date  . Vaginal hysterectomy  11/11     Vag cuff, mild rectocele  . Wisdom tooth extraction    . Colonoscopy    . Bladder suspension N/A 11/10/2013    Procedure:  TRANSVAGINAL TAPE (TVT) PROCEDURE;  Surgeon: Jamey Reas de Berton Lan, MD;  Location: [facility];  Service: Gynecology;  Laterality: N/A;  . Cystoscopy N/A 11/10/2013    Procedure: CYSTOSCOPY;  Surgeon: Jamey Reas de Berton Lan, MD;  Location: [facility];  Service: Gynecology;  Laterality: N/A;    Review of Systems Negative, with the exception of above mentioned in HPI     Objective:   Physical Exam BP 144/91 mmHg  Pulse 76  Temp(Src) 98.2 F (36.8 C)  Resp 14  Ht 5\' 4"  (1.626 m)  Wt 169 lb (76.658 kg)  BMI 28.99 kg/m2 Gen: Afebrile. No acute distress. Nontoxic in appearance, well-developed, well-nourished, Caucasian female. HENT: AT. [place].  MMM.  Eyes:Pupils Equal Round Reactive to light, Extraocular movements intact,  Conjunctiva without redness, discharge or icterus. MSK: No erythema, no soft tissue swelling over her right elbow. No tenderness to palpation. Full range of motion, no discomfort with resisted supination, flexion or extension. Negative Tinel's at elbow and wrist. 5/5 upper extremity strength bilaterally. Neurovascularly intact distally.     Assessment & Plan:  1. Ulnar nerve compression, right - Patient reports her symptoms are much improved. She is intermittently continues to use the arm band. She is instructed to take naproxen/Aleve if symptoms recur. Avoidance of motion that reproduces symptoms if  possible.  2. Insomnia - Patient states she is doing well on Ambien 5 mg. - Refill on Ambien provided today.  Follow-up in May 2017 for complete physical.

## 2015-07-01 NOTE — Patient Instructions (Signed)
Continue arm band as needed and aleve two times a day if you start to get a flare.  Avoid any motion that reproducers symptoms if able.  It was a pleasure to see you today and I hope you have a great christmas.

## 2015-07-01 NOTE — Progress Notes (Signed)
Pre visit review using our clinic review tool, if applicable. No additional management support is needed unless otherwise documented below in the visit note. 

## 2015-08-11 ENCOUNTER — Encounter: Payer: Self-pay | Admitting: Obstetrics and Gynecology

## 2015-08-11 ENCOUNTER — Ambulatory Visit (INDEPENDENT_AMBULATORY_CARE_PROVIDER_SITE_OTHER): Payer: PRIVATE HEALTH INSURANCE | Admitting: Obstetrics and Gynecology

## 2015-08-11 VITALS — BP 138/82 | HR 88 | Resp 22 | Ht 64.0 in | Wt 169.8 lb

## 2015-08-11 DIAGNOSIS — L659 Nonscarring hair loss, unspecified: Secondary | ICD-10-CM | POA: Diagnosis not present

## 2015-08-11 DIAGNOSIS — M8580 Other specified disorders of bone density and structure, unspecified site: Secondary | ICD-10-CM

## 2015-08-11 DIAGNOSIS — Z01419 Encounter for gynecological examination (general) (routine) without abnormal findings: Secondary | ICD-10-CM

## 2015-08-11 NOTE — Progress Notes (Signed)
Patient ID: [name], female   DOB: 10/16/1960, 55 y.o.   MRN: [id number] 55 y.o. G2P2 Married Caucasian female here for annual exam.    Good bladder control.  No bowel function problems.   No hot flashes but just feels more warm.  Turns the thermostat down at night.  Some discomfort with intercourse.  Not a problem for patient currently.  Notes hair loss.  Feel embarrassed by this.  Has checked thyroid which is normal.  Has a dermatologist and will check to see if her provider is still on her insurance list.   Some lactose intolerance.   PCP:  Howard Pouch, DO  No LMP recorded. Patient has had a hysterectomy.          Sexually active: Yes.   female The current method of family planning is status post hysterectomy.    Exercising: Yes.    walking and works out at gym. Smoker:  no  Health Maintenance: Pap:  2012 normal per patient in [place] History of abnormal Pap:  no MMG:  06-22-15 Density Cat.B/Neg/BiRads1:[facility] Colonoscopy:  2011 normal in [place] due 2021. BMD: 06-20-15   Result : Mild Osteopenia of hip:[facility] TDaP:  2014 Screening Labs:  Hb today: PCP, Urine today: Trace RBCs--asymptomatic.  Had this with PCP in 2016 as well and had neg UC.  Was treated with an antibiotic. She was asymptomatic then as well as now.    reports that she has never smoked. She has never used smokeless tobacco. She reports that she drinks alcohol. She reports that she does not use illicit drugs.  Past Medical History  Diagnosis Date  . Insomnia     started ambien 5 ya.  . Diverticulosis of colon   . History of rectocele   . Periodic heart flutter ([place])   . Urinary incontinence   . SVD (spontaneous vaginal delivery)     x 2  . Arthritis     neck  . Osteopenia     --left femur    Past Surgical History  Procedure Laterality Date  . Vaginal hysterectomy  11/11     Vag cuff, mild rectocele  . Wisdom tooth extraction    . Colonoscopy    .  Bladder suspension N/A 11/10/2013    Procedure: TRANSVAGINAL TAPE (TVT) PROCEDURE;  Surgeon: Jamey Reas de Berton Lan, MD;  Location: [facility];  Service: Gynecology;  Laterality: N/A;  . Cystoscopy N/A 11/10/2013    Procedure: CYSTOSCOPY;  Surgeon: Jamey Reas de Berton Lan, MD;  Location: [facility];  Service: Gynecology;  Laterality: N/A;    Current Outpatient Prescriptions  Medication Sig Dispense Refill  . Cholecalciferol (VITAMIN D) 2000 UNITS tablet Take 2,000 Units by mouth daily.    Marland Kitchen KRILL OIL PO Take 1 capsule by mouth daily.    Marland Kitchen zolpidem (AMBIEN) 5 MG tablet Take 1 tablet (5 mg total) by mouth at bedtime as needed for sleep. 30 tablet 1   No current facility-administered medications for this visit.    Family History  Problem Relation Age of Onset  . Arthritis Mother   . Hypertension Mother   . Hyperlipidemia Mother   . Heart disease Mother 20    dec  . Hypertension Father   . Hyperlipidemia Father   . Heart disease Father 17    smoked for 10 yrs., no muscle damage, 1 MI   . Pulmonary fibrosis Father     diagnosed end  stage, cause unkn.  Marland Kitchen Urolithiasis Father   . Hyperlipidemia Brother   . Urolithiasis Brother   . Diabetes Son   . Asthma Son   . Seizures Son   . Arthritis Maternal Grandmother   . Arthritis Paternal Grandmother   . Asthma Son   . Diverticulosis Sister     ROS:  Pertinent items are noted in HPI.  Otherwise, a comprehensive ROS was negative.  Exam:   BP 138/82 mmHg  Pulse 88  Resp 22  Ht 5\' 4"  (1.626 m)  Wt 169 lb 12.8 oz (77.021 kg)  BMI 29.13 kg/m2    General appearance: alert, cooperative and appears stated age Head: Normocephalic, without obvious abnormality, atraumatic Neck: no adenopathy, supple, symmetrical, trachea midline and thyroid normal to inspection and palpation Lungs: clear to auscultation bilaterally Breasts: normal appearance, no masses or tenderness, Inspection negative, No nipple retraction or dimpling,  No nipple discharge or bleeding, No axillary or supraclavicular adenopathy Heart: regular rate and rhythm Abdomen: soft, non-tender; bowel sounds normal; no masses,  no organomegaly Extremities: extremities normal, atraumatic, no cyanosis or edema Skin: Skin color, texture, turgor normal. No rashes or lesions Lymph nodes: Cervical, supraclavicular, and axillary nodes normal. No abnormal inguinal nodes palpated Neurologic: Grossly normal  Pelvic: External genitalia:  no lesions              Urethra:  normal appearing urethra with no masses, tenderness or lesions              Bartholins and Skenes: normal                 Vagina: normal appearing vagina with normal color and discharge, no lesions              Cervix: absent              Pap taken: No. Bimanual Exam:  Uterus:  uterus absent              Adnexa: normal adnexa and no mass, fullness, tenderness              Rectovaginal: Yes.  .  Confirms.              Anus:  normal sphincter tone, no lesions  Chaperone was present for exam.  Assessment:   Well woman visit with normal exam. Status post TVH.  Status post TVT.  Mild osteopenia.  Hair loss.  Plan: Yearly mammogram recommended after age 55.  Recommended self breast exam.  Pap and HR HPV as above. Discussed Calcium, Vitamin D, regular exercise program including cardiovascular and weight bearing exercise. Labs performed.  No..   See orders. Refills given on medications.  No..  See orders. Bone density in 2 -3 years. Follow up with dermatology.  Follow up annually and prn.      After visit summary provided.

## 2015-08-11 NOTE — Patient Instructions (Signed)

## 2015-08-25 ENCOUNTER — Other Ambulatory Visit: Payer: Self-pay | Admitting: *Deleted

## 2015-08-25 NOTE — Telephone Encounter (Signed)
Received a refill request from [facility] for patient ambien. Last OV 07/01/2015. Last Rx 07/01/2015 for 30 tabs with 1 refill

## 2015-08-26 MED ORDER — ZOLPIDEM TARTRATE 5 MG PO TABS: 5.0000 mg | ORAL_TABLET | Freq: Every evening | ORAL | Status: DC | PRN

## 2015-09-16 ENCOUNTER — Ambulatory Visit (INDEPENDENT_AMBULATORY_CARE_PROVIDER_SITE_OTHER): Payer: Managed Care, Other (non HMO) | Admitting: Family Medicine

## 2015-09-16 ENCOUNTER — Encounter: Payer: Self-pay | Admitting: Family Medicine

## 2015-09-16 VITALS — BP 143/78 | HR 69 | Temp 98.2°F | Resp 20 | Wt 174.0 lb

## 2015-09-16 DIAGNOSIS — M545 Low back pain, unspecified: Secondary | ICD-10-CM | POA: Insufficient documentation

## 2015-09-16 DIAGNOSIS — G8929 Other chronic pain: Secondary | ICD-10-CM | POA: Insufficient documentation

## 2015-09-16 MED ORDER — PREDNISONE 20 MG PO TABS: ORAL_TABLET | ORAL | Status: DC

## 2015-09-16 MED ORDER — CYCLOBENZAPRINE HCL 10 MG PO TABS: 10.0000 mg | ORAL_TABLET | Freq: Three times a day (TID) | ORAL | Status: DC | PRN

## 2015-09-16 MED ORDER — TRAMADOL HCL 50 MG PO TABS: 50.0000 mg | ORAL_TABLET | Freq: Three times a day (TID) | ORAL | Status: DC | PRN

## 2015-09-16 MED ORDER — METHYLPREDNISOLONE ACETATE 80 MG/ML IJ SUSP
80.0000 mg | Freq: Once | INTRAMUSCULAR | Status: AC
  Administered 2015-09-16: 80 mg via INTRAMUSCULAR

## 2015-09-16 NOTE — Progress Notes (Signed)
Patient ID: [name], female   DOB: [birth year], 55 y.o.   MRN: [id number]     [name] , 22-Dec-1960, 55 y.o., female MRN: [id number]  CC: Back pain Subjective: Pt presents for an acute OV with complaints of back pain of 2 days duration.  Pt has had similar episode 10 years ago and back locked up and she could not move . This resulted in her being in bed for 4 days.  Her back has had flared 3x since but not severe. This time is similiar to the first. She is unable to get comfortable to sleep. Sitting can make worse, rolling over in bed, bending over. No radiation of pain/numbness to buttocks or extremities. No back injury or surgery. No bladder or bowel difficulties. She has tried advil and it did not work and heat therapy. She was standing in front of the bathroom mirror, when she states that her and it just grabbed causing her to flinch. She states she was not bending, stretching or lifting anything.   Allergies  Allergen Reactions  . Amoxicillin Hives  . Sulfa Antibiotics Rash    Childhood rxn   Social History  Substance Use Topics  . Smoking status: Never Smoker   . Smokeless tobacco: Never Used  . Alcohol Use: 0.0 oz/week    0 Standard drinks or equivalent per week     Comment: ocassional wine-1 glass every 2 weeks   Past Medical History  Diagnosis Date  . Insomnia     started ambien 5 ya.  . Diverticulosis of colon   . History of rectocele   . Periodic heart flutter ([place])   . Urinary incontinence   . SVD (spontaneous vaginal delivery)     x 2  . Arthritis     neck  . Osteopenia     --left femur   Past Surgical History  Procedure Laterality Date  . Vaginal hysterectomy  11/11     Vag cuff, mild rectocele  . Wisdom tooth extraction    . Colonoscopy    . Bladder suspension N/A 11/10/2013    Procedure: TRANSVAGINAL TAPE (TVT) PROCEDURE;  Surgeon: Jamey Reas de Berton Lan, MD;  Location: [facility];  Service: Gynecology;  Laterality: N/A;  .  Cystoscopy N/A 11/10/2013    Procedure: CYSTOSCOPY;  Surgeon: Jamey Reas de Berton Lan, MD;  Location: [facility];  Service: Gynecology;  Laterality: N/A;   Family History  Problem Relation Age of Onset  . Arthritis Mother   . Hypertension Mother   . Hyperlipidemia Mother   . Heart disease Mother 91    dec  . Hypertension Father   . Hyperlipidemia Father   . Heart disease Father 24    smoked for 10 yrs., no muscle damage, 1 MI   . Pulmonary fibrosis Father     diagnosed end stage, cause unkn.  Marland Kitchen Urolithiasis Father   . Hyperlipidemia Brother   . Urolithiasis Brother   . Diabetes Son   . Asthma Son   . Seizures Son   . Arthritis Maternal Grandmother   . Arthritis Paternal Grandmother   . Asthma Son   . Diverticulosis Sister      Medication List       This list is accurate as of: 09/16/15  8:11 AM.  Always use your most recent med list.               KRILL OIL PO  Take 1 capsule by mouth  daily.     Vitamin D 2000 units tablet  Take 2,000 Units by mouth daily.     zolpidem 5 MG tablet  Commonly known as:  AMBIEN  Take 1 tablet (5 mg total) by mouth at bedtime as needed for sleep.       ROS: Negative, with the exception of above mentioned in HPI Objective:  BP 143/78 mmHg  Pulse 69  Temp(Src) 98.2 F (36.8 C)  Resp 20  Wt 174 lb (78.926 kg)  SpO2 99% Body mass index is 29.85 kg/(m^2). Gen: Afebrile. No acute distress. Nontoxic in appearance, well-developed, well-nourished, Caucasian female. HENT: AT. [place].  MMM, no oral lesions.  Eyes:Pupils Equal Round Reactive to light, Extraocular movements intact,  Conjunctiva without redness, discharge or icterus. MSK: No erythema, no bruising. No bony tenderness of the lumbar spine. Tenderness left SI joint. No bony step-off. Mild fullness and tenderness bilateral paraspinal lumbar muscles. Full range of motion with discomfort extension greater than flexion of the back. Mild discomfort with left side bending.  Negative straight leg raises bilaterally. Positive FABRE bilaterally for SI joint discomfort. Normal range of motion bilateral lower extremity. Neurovascularly intact distally. Neuro: Slow guarded gait. PERLA. EOMi. Alert. Oriented x3  Muscle strength 5/5 bilateral lower extremity. DTRs equal bilaterally.  Assessment/Plan: [name] is a 55 y.o. female present for acute OV for  1. Bilateral low back pain without sciatica - IM Depo-Medrol today, patient is then to start prednisone taper tomorrow. Flexeril, nightly for the next 5-7 nights. Can take throughout the day if able to tolerate. Tramadol for severe pain. Heat therapy to continue in 20 minute intervals. - cyclobenzaprine (FLEXERIL) 10 MG tablet; Take 1 tablet (10 mg total) by mouth 3 (three) times daily as needed for muscle spasms.  Dispense: 30 tablet; Refill: 0 - predniSONE (DELTASONE) 20 MG tablet; 60 mg x3d, 40 mg x3d, 20 mg x2d, 10 mg x2d  Dispense: 18 tablet; Refill: 0 - traMADol (ULTRAM) 50 MG tablet; Take 1 tablet (50 mg total) by mouth every 8 (eight) hours as needed.  Dispense: 30 tablet; Refill: 0 - methylPREDNISolone acetate (DEPO-MEDROL) injection 80 mg; Inject 1 mL (80 mg total) into the muscle once. - Follow-up 2 weeks, sooner if not improving. Patient to start light stretches in one week, using pain as her guide is able to tolerate.  Emiah Pellicano Raoul Pitch, DO  [place]

## 2015-09-16 NOTE — Patient Instructions (Signed)

## 2015-09-30 ENCOUNTER — Ambulatory Visit (INDEPENDENT_AMBULATORY_CARE_PROVIDER_SITE_OTHER): Payer: Managed Care, Other (non HMO) | Admitting: Family Medicine

## 2015-09-30 ENCOUNTER — Telehealth: Payer: Self-pay | Admitting: Family Medicine

## 2015-09-30 ENCOUNTER — Encounter: Payer: Self-pay | Admitting: Family Medicine

## 2015-09-30 VITALS — BP 135/82 | HR 94 | Temp 98.6°F | Resp 20 | Wt 169.5 lb

## 2015-09-30 DIAGNOSIS — M545 Low back pain, unspecified: Secondary | ICD-10-CM

## 2015-09-30 DIAGNOSIS — R1013 Epigastric pain: Secondary | ICD-10-CM

## 2015-09-30 LAB — H. PYLORI ANTIBODY, IGG: H PYLORI IGG: NEGATIVE

## 2015-09-30 MED ORDER — OMEPRAZOLE 40 MG PO CPDR: 40.0000 mg | DELAYED_RELEASE_CAPSULE | Freq: Every day | ORAL | Status: DC

## 2015-09-30 NOTE — Patient Instructions (Signed)
Gastritis, Adult Gastritis is soreness and swelling (inflammation) of the lining of the stomach. Gastritis can develop as a sudden onset (acute) or long-term (chronic) condition. If gastritis is not treated, it can lead to stomach bleeding and ulcers. CAUSES  Gastritis occurs when the stomach lining is weak or damaged. Digestive juices from the stomach then inflame the weakened stomach lining. The stomach lining may be weak or damaged due to viral or bacterial infections. One common bacterial infection is the Helicobacter pylori infection. Gastritis can also result from excessive alcohol consumption, taking certain medicines, or having too much acid in the stomach.  SYMPTOMS  In some cases, there are no symptoms. When symptoms are present, they may include:  Pain or a burning sensation in the upper abdomen.  Nausea.  Vomiting.  An uncomfortable feeling of fullness after eating. DIAGNOSIS  Your caregiver may suspect you have gastritis based on your symptoms and a physical exam. To determine the cause of your gastritis, your caregiver may perform the following:  Blood or stool tests to check for the H pylori bacterium.  Gastroscopy. A thin, flexible tube (endoscope) is passed down the esophagus and into the stomach. The endoscope has a light and camera on the end. Your caregiver uses the endoscope to view the inside of the stomach.  Taking a tissue sample (biopsy) from the stomach to examine under a microscope. TREATMENT  Depending on the cause of your gastritis, medicines may be prescribed. If you have a bacterial infection, such as an H pylori infection, antibiotics may be given. If your gastritis is caused by too much acid in the stomach, H2 blockers or antacids may be given. Your caregiver may recommend that you stop taking aspirin, ibuprofen, or other nonsteroidal anti-inflammatory drugs (NSAIDs). HOME CARE INSTRUCTIONS  Only take over-the-counter or prescription medicines as directed by  your caregiver.  If you were given antibiotic medicines, take them as directed. Finish them even if you start to feel better.  Drink enough fluids to keep your urine clear or pale yellow.  Avoid foods and drinks that make your symptoms worse, such as:  Caffeine or alcoholic drinks.  Chocolate.  Peppermint or mint flavorings.  Garlic and onions.  Spicy foods.  Citrus fruits, such as oranges, lemons, or limes.  Tomato-based foods such as sauce, chili, salsa, and pizza.  Fried and fatty foods.  Eat small, frequent meals instead of large meals. SEEK IMMEDIATE MEDICAL CARE IF:   You have black or dark red stools.  You vomit blood or material that looks like coffee grounds.  You are unable to keep fluids down.  Your abdominal pain gets worse.  You have a fever.  You do not feel better after 1 week.  You have any other questions or concerns. MAKE SURE YOU:  Understand these instructions.  Will watch your condition.  Will get help right away if you are not doing well or get worse.   This information is not intended to replace advice given to you by your health care provider. Make sure you discuss any questions you have with your health care provider.   Document Released: 07/10/2001 Document Revised: 01/15/2012 Document Reviewed: 08/29/2011 Elsevier Interactive Patient Education 2016 [place] for Gastroesophageal Reflux Disease, Adult When you have gastroesophageal reflux disease (GERD), the foods you eat and your eating habits are very important. Choosing the right foods can help ease the discomfort of GERD. WHAT GENERAL GUIDELINES DO I NEED TO FOLLOW?  Choose fruits, vegetables, whole  grains, low-fat dairy products, and low-fat meat, fish, and poultry.  Limit fats such as oils, salad dressings, butter, nuts, and avocado.  Keep a food diary to identify foods that cause symptoms.  Avoid foods that cause reflux. These may be different for  different people.  Eat frequent small meals instead of three large meals each day.  Eat your meals slowly, in a relaxed setting.  Limit fried foods.  Cook foods using methods other than frying.  Avoid drinking alcohol.  Avoid drinking large amounts of liquids with your meals.  Avoid bending over or lying down until 2-3 hours after eating. WHAT FOODS ARE NOT RECOMMENDED? The following are some foods and drinks that may worsen your symptoms: Vegetables Tomatoes. Tomato juice. Tomato and spaghetti sauce. Chili peppers. Onion and garlic. Horseradish. Fruits Oranges, grapefruit, and lemon (fruit and juice). Meats High-fat meats, fish, and poultry. This includes hot dogs, ribs, ham, sausage, salami, and bacon. Dairy Whole milk and chocolate milk. Sour cream. Cream. Butter. Ice cream. Cream cheese.  Beverages Coffee and tea, with or without caffeine. Carbonated beverages or energy drinks. Condiments Hot sauce. Barbecue sauce.  Sweets/Desserts Chocolate and cocoa. Donuts. Peppermint and spearmint. Fats and Oils High-fat foods, including [place] fries and potato chips. Other Vinegar. Strong spices, such as black pepper, [place] pepper, red pepper, cayenne, curry powder, cloves, ginger, and chili powder. The items listed above may not be a complete list of foods and beverages to avoid. Contact your dietitian for more information.   This information is not intended to replace advice given to you by your health care provider. Make sure you discuss any questions you have with your health care provider.   Document Released: 07/16/2005 Document Revised: 08/06/2014 Document Reviewed: 05/20/2013 Elsevier Interactive Patient Education 2016 [place].  Gastroesophageal Reflux Disease, Adult Normally, food travels down the esophagus and stays in the stomach to be digested. However, when a person has gastroesophageal reflux disease (GERD), food and stomach acid move back up into the esophagus.  When this happens, the esophagus becomes sore and inflamed. Over time, GERD can create small holes (ulcers) in the lining of the esophagus.  CAUSES This condition is caused by a problem with the muscle between the esophagus and the stomach (lower esophageal sphincter, or LES). Normally, the LES muscle closes after food passes through the esophagus to the stomach. When the LES is weakened or abnormal, it does not close properly, and that allows food and stomach acid to go back up into the esophagus. The LES can be weakened by certain dietary substances, medicines, and medical conditions, including:  Tobacco use.  Pregnancy.  Having a hiatal hernia.  Heavy alcohol use.  Certain foods and beverages, such as coffee, chocolate, onions, and peppermint. RISK FACTORS This condition is more likely to develop in:  People who have an increased body weight.  People who have connective tissue disorders.  People who use NSAID medicines. SYMPTOMS Symptoms of this condition include:  Heartburn.  Difficult or painful swallowing.  The feeling of having a lump in the throat.  Abitter taste in the mouth.  Bad breath.  Having a large amount of saliva.  Having an upset or bloated stomach.  Belching.  Chest pain.  Shortness of breath or wheezing.  Ongoing (chronic) cough or a night-time cough.  Wearing away of tooth enamel.  Weight loss. Different conditions can cause chest pain. Make sure to see your health care provider if you experience chest pain. DIAGNOSIS Your health care provider will  take a medical history and perform a physical exam. To determine if you have mild or severe GERD, your health care provider may also monitor how you respond to treatment. You may also have other tests, including:  An endoscopy toexamine your stomach and esophagus with a small camera.  A test thatmeasures the acidity level in your esophagus.  A test thatmeasures how much pressure is on your  esophagus.  A barium swallow or modified barium swallow to show the shape, size, and functioning of your esophagus. TREATMENT The goal of treatment is to help relieve your symptoms and to prevent complications. Treatment for this condition may vary depending on how severe your symptoms are. Your health care provider may recommend:  Changes to your diet.  Medicine.  Surgery. HOME CARE INSTRUCTIONS Diet  Follow a diet as recommended by your health care provider. This may involve avoiding foods and drinks such as:  Coffee and tea (with or without caffeine).  Drinks that containalcohol.  Energy drinks and sports drinks.  Carbonated drinks or sodas.  Chocolate and cocoa.  Peppermint and mint flavorings.  Garlic and onions.  Horseradish.  Spicy and acidic foods, including peppers, chili powder, curry powder, vinegar, hot sauces, and barbecue sauce.  Citrus fruit juices and citrus fruits, such as oranges, lemons, and limes.  Tomato-based foods, such as red sauce, chili, salsa, and pizza with red sauce.  Fried and fatty foods, such as donuts, french fries, potato chips, and high-fat dressings.  High-fat meats, such as hot dogs and fatty cuts of red and [place] meats, such as rib eye steak, sausage, ham, and bacon.  High-fat dairy items, such as whole milk, butter, and cream cheese.  Eat small, frequent meals instead of large meals.  Avoid drinking large amounts of liquid with your meals.  Avoid eating meals during the 2-3 hours before bedtime.  Avoid lying down right after you eat.  Do not exercise right after you eat. General Instructions  Pay attention to any changes in your symptoms.  Take over-the-counter and prescription medicines only as told by your health care provider. Do not take aspirin, ibuprofen, or other NSAIDs unless your health care provider told you to do so.  Do not use any tobacco products, including cigarettes, chewing tobacco, and e-cigarettes.  If you need help quitting, ask your health care provider.  Wear loose-fitting clothing. Do not wear anything tight around your waist that causes pressure on your abdomen.  Raise (elevate) the head of your bed 6 inches (15cm).  Try to reduce your stress, such as with yoga or meditation. If you need help reducing stress, ask your health care provider.  If you are overweight, reduce your weight to an amount that is healthy for you. Ask your health care provider for guidance about a safe weight loss goal.  Keep all follow-up visits as told by your health care provider. This is important. SEEK MEDICAL CARE IF:  You have new symptoms.  You have unexplained weight loss.  You have difficulty swallowing, or it hurts to swallow.  You have wheezing or a persistent cough.  Your symptoms do not improve with treatment.  You have a hoarse voice. SEEK IMMEDIATE MEDICAL CARE IF:  You have pain in your arms, neck, jaw, teeth, or back.  You feel sweaty, dizzy, or light-headed.  You have chest pain or shortness of breath.  You vomit and your vomit looks like blood or coffee grounds.  You faint.  Your stool is bloody or black.  You cannot swallow, drink, or eat.   This information is not intended to replace advice given to you by your health care provider. Make sure you discuss any questions you have with your health care provider.   Document Released: 04/25/2005 Document Revised: 04/06/2015 Document Reviewed: 11/10/2014 Elsevier Interactive Patient Education 2016 Elsevier Inc[employer]  Hiatal Hernia A hiatal hernia occurs when part of your stomach slides above the muscle that separates your abdomen from your chest (diaphragm). You can be born with a hiatal hernia (congenital), or it may develop over time. In almost all cases of hiatal hernia, only the top part of the stomach pushes through.  Many people have a hiatal hernia with no symptoms. The larger the hernia, the more likely that you will  have symptoms. In some cases, a hiatal hernia allows stomach acid to flow back into the tube that carries food from your mouth to your stomach (esophagus). This may cause heartburn symptoms. Severe heartburn symptoms may mean you have developed a condition called gastroesophageal reflux disease (GERD).  CAUSES  Hiatal hernias are caused by a weakness in the opening (hiatus) where your esophagus passes through your diaphragm to attach to the upper part of your stomach. You may be born with a weakness in your hiatus, or a weakness can develop. RISK FACTORS Older age is a major risk factor for a hiatal hernia. Anything that increases pressure on your diaphragm can also increase your risk of a hiatal hernia. This includes:  Pregnancy.  Excess weight.  Frequent constipation. SIGNS AND SYMPTOMS  People with a hiatal hernia often have no symptoms. If symptoms develop, they are almost always caused by GERD. They may include:  Heartburn.  Belching.  Indigestion.  Trouble swallowing.  Coughing or wheezing.  Sore throat.  Hoarseness.  Chest pain. DIAGNOSIS  A hiatal hernia is sometimes found during an exam for another problem. Your health care provider may suspect a hiatal hernia if you have symptoms of GERD. Tests may be done to diagnose GERD. These may include:  X-rays of your stomach or chest.  An upper gastrointestinal (GI) series. This is an X-ray exam of your GI tract involving the use of a chalky liquid that you swallow. The liquid shows up clearly on the X-ray.  Endoscopy. This is a procedure to look into your stomach using a thin, flexible tube that has a tiny camera and light on the end of it. TREATMENT  If you have no symptoms, you may not need treatment. If you have symptoms, treatment may include:  Dietary and lifestyle changes to help reduce GERD symptoms.  Medicines. These may include:  Over-the-counter antacids.  Medicines that make your stomach empty more  quickly.  Medicines that block the production of stomach acid (H2 blockers).  Stronger medicines to reduce stomach acid (proton pump inhibitors).  You may need surgery to repair the hernia if other treatments are not helping. HOME CARE INSTRUCTIONS   Take all medicines as directed by your health care provider.  Quit smoking, if you smoke.  Try to achieve and maintain a healthy body weight.  Eat frequent small meals instead of three large meals a day. This keeps your stomach from getting too full.  Eat slowly.  Do not lie down right after eating.  Do noteat 1-2 hours before bed.   Do not drink beverages with caffeine. These include cola, coffee, cocoa, and tea.  Do not drink alcohol.  Avoid foods that can make symptoms of GERD worse. These may  include:  Fatty foods.  Citrus fruits.  Other foods and drinks that contain acid.  Avoid putting pressure on your belly. Anything that puts pressure on your belly increases the amount of acid that may be pushed up into your esophagus.   Avoid bending over, especially after eating.  Raise the head of your bed by putting blocks under the legs. This keeps your head and esophagus higher than your stomach.  Do not wear tight clothing around your chest or stomach.  Try not to strain when having a bowel movement, when urinating, or when lifting heavy objects. SEEK MEDICAL CARE IF:  Your symptoms are not controlled with medicines or lifestyle changes.  You are having trouble swallowing.  You have coughing or wheezing that will not go away. SEEK IMMEDIATE MEDICAL CARE IF:  Your pain is getting worse.  Your pain spreads to your arms, neck, jaw, teeth, or back.  You have shortness of breath.  You sweat for no reason.  You feel sick to your stomach (nauseous) or vomit.  You vomit blood.  You have bright red blood in your stools.  You have black, tarry stools.    This information is not intended to replace advice  given to you by your health care provider. Make sure you discuss any questions you have with your health care provider.   Document Released: 10/06/2003 Document Revised: 08/06/2014 Document Reviewed: 07/03/2013 Elsevier Interactive Patient Education 2016 [place] omeprazole daily for 4 weeks and then follow up.  GERD diet.

## 2015-09-30 NOTE — Telephone Encounter (Signed)
Spoke with patient reviewed results. 

## 2015-09-30 NOTE — Telephone Encounter (Signed)
Please call pt [name] is negative.

## 2015-09-30 NOTE — Progress Notes (Signed)
Patient ID: [name], female   DOB: Jan 02, 1961, 55 y.o.   MRN: [id number]     [name] , 19-Jul-1961, 55 y.o., female MRN: [id number]  CC: Back pain follow-up. New complaint of epigastric pain Subjective:   Lower lumbar pain: Patient presented to the office approximately 2 and half weeks ago with complaints of new onset lower lumbar pain. Patient had had similar episodes in the past, but only 1 a severe as she was experiencing a few weeks ago. Patient was treated with IM Depo-Medrol, Flexeril and prednisone taper, she was given tramadol for severe pain. Patient states that her symptoms have completely resolved, she has full range of motion without pain currently. She did admit that the tramadol with hopeful to get her to sleep/comfortable however after stopping the medication she felt that she went through a mild withdrawal.   epigastric pain: Patient has complaints of new onset epigastric pain for at least the last week. He states she feels like it's gassy tension. She reports it has been constant for the past week, sometimes more painful than others, but constant. SHe denies any association with meals. She denies any nausea or vomiting. She reports it feels like gas pain, but she is unable to belch. She denies any changes in her bowel habits, movements. She denies any melena or hematochezia. She denies any appetite changes. She had been using NSAIDs for back pain. She has had elevated triglycerides in the past, recent triglycerides were 229. She has tried simethicone, which has not made any difference to her symptoms. She has never had reflux, but does admit to occasional heartburn. SHe has no history of hiatal hernia, but states her father had one.  Allergies  Allergen Reactions  . Amoxicillin Hives  . Sulfa Antibiotics Rash    Childhood rxn   Social History  Substance Use Topics  . Smoking status: Never Smoker   . Smokeless tobacco: Never Used  . Alcohol Use: 0.0 oz/week    0 Standard drinks or equivalent per week     Comment: ocassional wine-1 glass every 2 weeks   Past Medical History  Diagnosis Date  . Insomnia     started ambien 5 ya.  . Diverticulosis of colon   . History of rectocele   . 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 Urinary incontinence   . SVD (spontaneous vaginal delivery)     x 2  . Arthritis     neck  . Osteopenia     --left femur   Past Surgical History  Procedure Laterality Date  . Vaginal hysterectomy  11/11     Vag cuff, mild rectocele  . Wisdom tooth extraction    . Colonoscopy    . Bladder suspension N/A 11/10/2013    Procedure: TRANSVAGINAL TAPE (TVT) PROCEDURE;  Surgeon: Jamey Reas de Berton Lan, MD;  Location: [facility];  Service: Gynecology;  Laterality: N/A;  . Cystoscopy N/A 11/10/2013    Procedure: CYSTOSCOPY;  Surgeon: Jamey Reas de Berton Lan, MD;  Location: [facility];  Service: Gynecology;  Laterality: N/A;   Family History  Problem Relation Age of Onset  . Arthritis Mother   . Hypertension Mother   . Hyperlipidemia Mother   . Heart disease Mother 9    dec  . Hypertension Father   . Hyperlipidemia Father   . Heart disease Father 81    smoked for 10 yrs., no muscle damage, 1 MI   . Pulmonary fibrosis Father  diagnosed end stage, cause unkn.  Marland Kitchen Urolithiasis Father   . Hyperlipidemia Brother   . Urolithiasis Brother   . Diabetes Son   . Asthma Son   . Seizures Son   . Arthritis Maternal Grandmother   . Arthritis Paternal Grandmother   . Asthma Son   . Diverticulosis Sister      Medication List       This list is accurate as of: 09/30/15  7:01 PM.  Always use your most recent med list.               cyclobenzaprine 10 MG tablet  Commonly known as:  FLEXERIL  Take 1 tablet (10 mg total) by mouth 3 (three) times daily as needed for muscle spasms.     KRILL OIL PO  Take 1 capsule by mouth daily.     omeprazole 40 MG capsule  Commonly known as:  PRILOSEC  Take 1 capsule (40  mg total) by mouth daily.     Vitamin D 2000 units tablet  Take 2,000 Units by mouth daily.     zolpidem 5 MG tablet  Commonly known as:  AMBIEN  Take 1 tablet (5 mg total) by mouth at bedtime as needed for sleep.       ROS: Negative, with the exception of above mentioned in HPI Objective:  BP 135/82 mmHg  Pulse 94  Temp(Src) 98.6 F (37 C)  Resp 20  Wt 169 lb 8 oz (76.885 kg)  SpO2 96% Body mass index is 29.08 kg/(m^2). Gen: Afebrile. No acute distress. Nontoxic in appearance, well-developed, well-nourished, Caucasian female. HENT: AT. [place].  MMM, no oral lesions.  ABD: Soft, rounded, nondistended. Mild tenderness epigastric. Bowel sounds present. MSK: No erythema, no bruising. No bony tenderness of the lumbar spine. No bony tenderness.  No step-off.  Full range of motion without  Discomfort. Normal range of motion bilateral lower extremity. Neurovascularly intact distally. Neuro: Normal gait. PERLA. EOMi. Alert. Oriented x3  Muscle strength 5/5 bilateral lower extremity. DTRs equal bilaterally.  Assessment/Plan: KYLAR FAHL is a 55 y.o. female present for acute OV for  1. Bilateral low back pain without sciatica -Resolved completely with treatment.  2. Epigastric pain: -No problem. Possible gastritis versus hiatal hernia - No NSAIDs - Prilosec 40 mg - H. pylori IgG - Discussed GERD diet, AVS on GERD diet provided to patient today. - Follow-up in 4 weeks    Senaida Chilcote, DO  [place]

## 2015-10-29 ENCOUNTER — Encounter: Payer: Self-pay | Admitting: Family Medicine

## 2015-11-25 ENCOUNTER — Other Ambulatory Visit: Payer: Self-pay | Admitting: *Deleted

## 2015-11-25 MED ORDER — ZOLPIDEM TARTRATE 5 MG PO TABS: 5.0000 mg | ORAL_TABLET | Freq: Every evening | ORAL | Status: DC | PRN

## 2015-11-25 NOTE — Telephone Encounter (Signed)
Patient last seen 09/30/15 last refill was 08/26/15 30 with 2 refills on her [place]

## 2015-12-29 ENCOUNTER — Encounter: Payer: Self-pay | Admitting: Family Medicine

## 2015-12-29 ENCOUNTER — Ambulatory Visit (INDEPENDENT_AMBULATORY_CARE_PROVIDER_SITE_OTHER): Payer: Managed Care, Other (non HMO) | Admitting: Family Medicine

## 2015-12-29 VITALS — BP 126/83 | HR 70 | Temp 98.0°F | Resp 20 | Ht 64.0 in | Wt 173.8 lb

## 2015-12-29 DIAGNOSIS — M545 Low back pain, unspecified: Secondary | ICD-10-CM

## 2015-12-29 DIAGNOSIS — R350 Frequency of micturition: Secondary | ICD-10-CM

## 2015-12-29 LAB — POC URINALSYSI DIPSTICK (AUTOMATED)
BILIRUBIN UA: NEGATIVE
Glucose, UA: NEGATIVE
KETONES UA: NEGATIVE
Leukocytes, UA: NEGATIVE
Nitrite, UA: NEGATIVE
PROTEIN UA: NEGATIVE
SPEC GRAV UA: 1.01
Urobilinogen, UA: 0.2
pH, UA: 6

## 2015-12-29 MED ORDER — CYCLOBENZAPRINE HCL 10 MG PO TABS: 10.0000 mg | ORAL_TABLET | Freq: Three times a day (TID) | ORAL | Status: DC | PRN

## 2015-12-29 NOTE — Progress Notes (Signed)
Patient ID: [name], female   DOB: 1960-08-20, 55 y.o.   MRN: [id number]    [name] , [birth year], 55 y.o., female MRN: [id number]  CC: urinary frequency Subjective:   Urinary frequency: Pt presents for intermittent urinary frequency, dysuria, urgency, back pain, lower abdominal spasm/cramping and nausea for 6 weeks. Patient denies fever, chills, vomit or diarrhea. She had went to the [facility] April 13 th for these symptoms and she states the report was  Negative. She was given a prescription for pyridium and was unable to get it filled. She states she was started on omeprazole a few weeks prior and feels it might have been from that medication after reading side effects. She stopped the medication  A few days ago and has yet to have repeat symptoms. Pt has had hematuria in the past and evaluated with cysto, with no abnormal findings.   Allergies  Allergen Reactions  . Amoxicillin Hives  . Sulfa Antibiotics Rash    Childhood rxn   Social History  Substance Use Topics  . Smoking status: Never Smoker   . Smokeless tobacco: Never Used  . Alcohol Use: 0.0 oz/week    0 Standard drinks or equivalent per week     Comment: ocassional wine-1 glass every 2 weeks   Past Medical History  Diagnosis Date  . Insomnia     started ambien 5 ya.  . Diverticulosis of colon   . History of rectocele   . 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 Urinary incontinence   . SVD (spontaneous vaginal delivery)     x 2  . Arthritis     neck  . Osteopenia     --left femur   Past Surgical History  Procedure Laterality Date  . Vaginal hysterectomy  11/11     Vag cuff, mild rectocele  . Wisdom tooth extraction    . Colonoscopy    . Bladder suspension N/A 11/10/2013    Procedure: TRANSVAGINAL TAPE (TVT) PROCEDURE;  Surgeon: Jamey Reas de Berton Lan, MD;  Location: [facility];  Service: Gynecology;  Laterality: N/A;  . Cystoscopy N/A 11/10/2013    Procedure: CYSTOSCOPY;  Surgeon: Jamey Reas  de Berton Lan, MD;  Location: [facility];  Service: Gynecology;  Laterality: N/A;   Family History  Problem Relation Age of Onset  . Arthritis Mother   . Hypertension Mother   . Hyperlipidemia Mother   . Heart disease Mother 49    dec  . Hypertension Father   . Hyperlipidemia Father   . Heart disease Father 109    smoked for 10 yrs., no muscle damage, 1 MI   . Pulmonary fibrosis Father     diagnosed end stage, cause unkn.  Marland Kitchen Urolithiasis Father   . Hyperlipidemia Brother   . Urolithiasis Brother   . Diabetes Son   . Asthma Son   . Seizures Son   . Arthritis Maternal Grandmother   . Arthritis Paternal Grandmother   . Asthma Son   . Diverticulosis Sister      Medication List       This list is accurate as of: 12/29/15 12:02 PM.  Always use your most recent med list.               KRILL OIL PO  Take 1 capsule by mouth daily.     omeprazole 40 MG capsule  Commonly known as:  PRILOSEC  Take 1 capsule (40 mg total) by mouth daily.  Vitamin D 2000 units tablet  Take 2,000 Units by mouth daily.     zolpidem 5 MG tablet  Commonly known as:  AMBIEN  Take 1 tablet (5 mg total) by mouth at bedtime as needed for sleep.         ROS: Negative, with the exception of above mentioned in HPI   Objective:  BP 126/83 mmHg  Pulse 70  Temp(Src) 98 F (36.7 C) (Oral)  Resp 20  Ht 5\' 4"  (1.626 m)  Wt 173 lb 12 oz (78.812 kg)  BMI 29.81 kg/m2  SpO2 97% Body mass index is 29.81 kg/(m^2). Gen: Afebrile. No acute distress. Nontoxic in appearance, well developed, well nourished, pleasant female. HENT: AT. [place].  MMM, no oral lesions.  Eyes:Pupils Equal Round Reactive to light, Extraocular movements intact,  Conjunctiva without redness, discharge or icterus. CV: RRR  Abd: Soft. NTND. BS present  Neuro: Normal gait. PERLA. EOMi. Alert. Oriented x3  Psych: Normal affect, dress and demeanor. Normal speech. Normal thought content and judgment..  Assessment/Plan: TRALANA GETTELFINGER is a 55 y.o. female present for acute OV for  Bilateral low back pain without sciatica - refills on flexeril today. - cyclobenzaprine (FLEXERIL) 10 MG tablet; Take 1 tablet (10 mg total) by mouth 3 (three) times daily as needed for muscle spasms.  Dispense: 30 tablet; Refill: 0  Urinary frequency - possibly SE to prilosec.  - POCT Urinalysis Dipstick (Automated)--> trace lysed RBC only.  - Avoid prilosec, education on bladder irritants.  - If symptoms return will need to be seen.  - If epigastric pain returns she will call in and I will be happy to call in a different PPI. She has had 12 weeks of PPI therpay, if she monitors her diet may not need.   > 25 minutes spent with patient, >50% of time spent face to face counseling patient and coordinating care.   electronically signed by:  Howard Pouch, DO  [place]

## 2015-12-29 NOTE — Patient Instructions (Signed)
Urine is normal today. Watch for common bladder irritants.  If symptoms return will need to consider imaging and urology referral.

## 2015-12-31 ENCOUNTER — Encounter: Payer: Self-pay | Admitting: Family Medicine

## 2016-01-02 ENCOUNTER — Telehealth: Payer: Self-pay | Admitting: Family Medicine

## 2016-01-02 DIAGNOSIS — K219 Gastro-esophageal reflux disease without esophagitis: Secondary | ICD-10-CM | POA: Insufficient documentation

## 2016-01-02 MED ORDER — PANTOPRAZOLE SODIUM 20 MG PO TBEC
20.0000 mg | DELAYED_RELEASE_TABLET | Freq: Every day | ORAL | Status: DC
Start: 2016-01-02 — End: 2016-03-26

## 2016-01-02 NOTE — Telephone Encounter (Signed)
Protonix prescribed for pt, please make her aware.  Received request for PPI restart.

## 2016-01-03 NOTE — Telephone Encounter (Signed)
Spoke with patient she will pick up her Rx at her pharmacy.

## 2016-02-20 ENCOUNTER — Other Ambulatory Visit: Payer: Self-pay | Admitting: *Deleted

## 2016-02-20 MED ORDER — ZOLPIDEM TARTRATE 5 MG PO TABS: 5.0000 mg | ORAL_TABLET | Freq: Every evening | ORAL | 2 refills | Status: DC | PRN

## 2016-02-20 NOTE — Telephone Encounter (Signed)
Rx faxed

## 2016-02-20 NOTE — Telephone Encounter (Signed)
Dr. Raoul Pitch pt.  RF request for zolpidem LOV: 07/01/15 Next ov: None Last written: 11/25/15 #30 w/ 2RF  Please advise. Thanks.

## 2016-03-26 ENCOUNTER — Other Ambulatory Visit: Payer: Self-pay | Admitting: *Deleted

## 2016-03-26 DIAGNOSIS — K219 Gastro-esophageal reflux disease without esophagitis: Secondary | ICD-10-CM

## 2016-03-26 MED ORDER — PANTOPRAZOLE SODIUM 20 MG PO TBEC: 20.0000 mg | DELAYED_RELEASE_TABLET | Freq: Every day | ORAL | 2 refills | Status: DC

## 2016-05-14 ENCOUNTER — Other Ambulatory Visit: Payer: Self-pay | Admitting: Obstetrics and Gynecology

## 2016-05-14 ENCOUNTER — Encounter: Payer: Self-pay | Admitting: Obstetrics and Gynecology

## 2016-05-14 DIAGNOSIS — Z1231 Encounter for screening mammogram for malignant neoplasm of breast: Secondary | ICD-10-CM

## 2016-05-15 ENCOUNTER — Other Ambulatory Visit: Payer: Self-pay | Admitting: *Deleted

## 2016-05-15 MED ORDER — ZOLPIDEM TARTRATE 5 MG PO TABS: 5.0000 mg | ORAL_TABLET | Freq: Every evening | ORAL | 2 refills | Status: DC | PRN

## 2016-05-15 NOTE — Telephone Encounter (Signed)
Pharmacy request for [place]. Last filled 02/20/16 for 30 tabs with 2 refills by Dr Anitra Lauth last seen by you 12/29/15 for back pain.

## 2016-06-20 ENCOUNTER — Other Ambulatory Visit: Payer: Self-pay | Admitting: *Deleted

## 2016-06-20 DIAGNOSIS — K219 Gastro-esophageal reflux disease without esophagitis: Secondary | ICD-10-CM

## 2016-06-20 MED ORDER — PANTOPRAZOLE SODIUM 20 MG PO TBEC: 20.0000 mg | DELAYED_RELEASE_TABLET | Freq: Every day | ORAL | 2 refills | Status: DC

## 2016-06-25 ENCOUNTER — Ambulatory Visit (HOSPITAL_BASED_OUTPATIENT_CLINIC_OR_DEPARTMENT_OTHER): Payer: Managed Care, Other (non HMO)

## 2016-07-02 ENCOUNTER — Ambulatory Visit (HOSPITAL_BASED_OUTPATIENT_CLINIC_OR_DEPARTMENT_OTHER)
Admission: RE | Admit: 2016-07-02 | Discharge: 2016-07-02 | Disposition: A | Discharge status: Home / Self Care | Payer: 59 | Source: Ambulatory Visit | Attending: Obstetrics and Gynecology | Admitting: Obstetrics and Gynecology

## 2016-07-02 DIAGNOSIS — Z1231 Encounter for screening mammogram for malignant neoplasm of breast: Secondary | ICD-10-CM | POA: Diagnosis not present

## 2016-08-10 ENCOUNTER — Encounter: Payer: Self-pay | Admitting: Family Medicine

## 2016-08-13 ENCOUNTER — Other Ambulatory Visit: Payer: Self-pay | Admitting: *Deleted

## 2016-08-13 MED ORDER — ZOLPIDEM TARTRATE 5 MG PO TABS: 5.0000 mg | ORAL_TABLET | Freq: Every evening | ORAL | 5 refills | Status: DC | PRN

## 2016-08-13 NOTE — Telephone Encounter (Signed)
Patient called requesting refill on ambien last office visit 6/17 last refill 05/15/16 30 with 2 refills.

## 2016-08-29 ENCOUNTER — Ambulatory Visit (INDEPENDENT_AMBULATORY_CARE_PROVIDER_SITE_OTHER): Payer: PRIVATE HEALTH INSURANCE | Admitting: Obstetrics and Gynecology

## 2016-08-29 ENCOUNTER — Encounter: Payer: Self-pay | Admitting: Obstetrics and Gynecology

## 2016-08-29 VITALS — BP 142/65 | HR 78 | Resp 16 | Ht 64.75 in | Wt 176.4 lb

## 2016-08-29 DIAGNOSIS — Z01419 Encounter for gynecological examination (general) (routine) without abnormal findings: Secondary | ICD-10-CM | POA: Diagnosis not present

## 2016-08-29 DIAGNOSIS — Z0000 Encounter for general adult medical examination without abnormal findings: Secondary | ICD-10-CM | POA: Diagnosis not present

## 2016-08-29 LAB — POCT URINALYSIS DIPSTICK
BILIRUBIN UA: NEGATIVE
Glucose, UA: NEGATIVE
Ketones, UA: NEGATIVE
LEUKOCYTES UA: NEGATIVE
NITRITE UA: NEGATIVE
PH UA: 5
PROTEIN UA: NEGATIVE
RBC UA: NEGATIVE
UROBILINOGEN UA: NEGATIVE

## 2016-08-29 NOTE — Progress Notes (Signed)
56 y.o. G2P2 Married Caucasian female here for annual exam.    Weight gain.  Trying to walk and reduce portion size.  This worked for her in the past.   Voiding well.  No leakage of urine.  BMs are normal.  Occasional sharp pain in her right side which is short lived and comes and goes in a sporadic but repetitive fashion. Wonders if it is her ovary.  Declines evaluation for this such as ultrasound.  Does not prevent activities.  Nothing makes it better or worse.  No relationship to BMs.   Still with hair loss.  Did not see dermatologist. She sees Dr. Derrel Nip at [facility].   PCP:   Howard Pouch, DO  No LMP recorded. Patient has had a hysterectomy.           Sexually active: Yes.   female The current method of family planning is status post hysterectomy.    Exercising: Yes.    Walks 30 minutes/day Smoker:  no  Health Maintenance: Pap:  2012 normal in [place] per patient  History of abnormal Pap:  no MMG:  07-02-16 Density B/Neg/BiRads1:[facility] Colonoscopy:   2011 normal in [place] due 2021.  BMD:  06-20-15 Result  Osteopenia:[facility].   TDaP:  2014 Gardasil:   N/A HIV:  Did for insurance purposes years ago and was negative.  Hep C:  Not done yet. Screening Labs:  Hb today: PCP, Urine today: Neg   reports that she has never smoked. She has never used smokeless tobacco. She reports that she drinks alcohol. She reports that she does not use drugs.  Past Medical History:  Diagnosis Date  . Arthritis    neck  . Diverticulosis of colon   . History of rectocele   . Insomnia    started ambien 5 ya.  . Osteopenia    --left femur  . Periodic heart flutter   . SVD (spontaneous vaginal delivery)    x 2  . Urinary incontinence     Past Surgical History:  Procedure Laterality Date  . BLADDER SUSPENSION N/A 11/10/2013   Procedure: TRANSVAGINAL TAPE (TVT) PROCEDURE;  Surgeon: Jamey Reas de Berton Lan, MD;   Location: [facility];  Service: Gynecology;  Laterality: N/A;  . COLONOSCOPY    . CYSTOSCOPY N/A 11/10/2013   Procedure: CYSTOSCOPY;  Surgeon: Jamey Reas de Berton Lan, MD;  Location: [facility];  Service: Gynecology;  Laterality: N/A;  . VAGINAL HYSTERECTOMY  11/11    Vag cuff, mild rectocele  . WISDOM TOOTH EXTRACTION      Current Outpatient Prescriptions  Medication Sig Dispense Refill  . Cholecalciferol (VITAMIN D) 2000 UNITS tablet Take 2,000 Units by mouth daily.    . cyclobenzaprine (FLEXERIL) 10 MG tablet Take 1 tablet (10 mg total) by mouth 3 (three) times daily as needed for muscle spasms. (Patient taking differently: Take 10 mg by mouth as needed for muscle spasms. ) 30 tablet 0  . KRILL OIL PO Take 1 capsule by mouth daily.    . pantoprazole (PROTONIX) 20 MG tablet Take 1 tablet (20 mg total) by mouth daily. 30 tablet 2  . zolpidem (AMBIEN) 5 MG tablet Take 1 tablet (5 mg total) by mouth at bedtime as needed for sleep. 30 tablet 5   No current facility-administered medications for this visit.     Family History  Problem Relation Age of Onset  . Arthritis Mother   . Hypertension Mother   .  Hyperlipidemia Mother   . Heart disease Mother 72    dec  . Hypertension Father   . Hyperlipidemia Father   . Heart disease Father 3    smoked for 10 yrs., no muscle damage, 1 MI   . Pulmonary fibrosis Father     diagnosed end stage, cause unkn.  Marland Kitchen Urolithiasis Father   . Hyperlipidemia Brother   . Urolithiasis Brother   . Diabetes Son   . Asthma Son   . Seizures Son   . Arthritis Maternal Grandmother   . Arthritis Paternal Grandmother   . Asthma Son   . Diverticulosis Sister     ROS:  Pertinent items are noted in HPI.  Otherwise, a comprehensive ROS was negative.  Exam:   BP (!) 142/65 (BP Location: Right Arm, Patient Position: Sitting, Cuff Size: Normal)   Pulse 78   Resp 16   Ht 5' 4.75" (1.645 m)   Wt 176 lb 6.4 oz (80 kg)   BMI 29.58 kg/m     General  appearance: alert, cooperative and appears stated age Head: Normocephalic, without obvious abnormality, atraumatic Neck: no adenopathy, supple, symmetrical, trachea midline and thyroid normal to inspection and palpation Lungs: clear to auscultation bilaterally Breasts: normal appearance, no masses or tenderness, No nipple retraction or dimpling, No nipple discharge or bleeding, No axillary or supraclavicular adenopathy Heart: regular rate and rhythm Abdomen: soft, non-tender; no masses, no organomegaly Extremities: extremities normal, atraumatic, no cyanosis or edema Skin: Skin color, texture, turgor normal. No rashes or lesions.  1.5 cm midabdominal lipoma.  Right thigh 1.5 cm lipoma. Lymph nodes: Cervical, supraclavicular, and axillary nodes normal. No abnormal inguinal nodes palpated Neurologic: Grossly normal  Pelvic: External genitalia:  Left mons area with 2 cm cyst just under the skin (long standing for 10 years per patient).  Feels like a sebaceous cyst.               Urethra:  normal appearing urethra with no masses, tenderness or lesions              Bartholins and Skenes: normal                 Vagina: normal appearing vagina with normal color and discharge, no lesions              Cervix: absent              Pap taken: No. Bimanual Exam:  Uterus:  absent              Adnexa: no mass, fullness, tenderness              Rectal exam: Yes.  .  Confirms.              Anus:  normal sphincter tone, no lesions  Chaperone was present for exam.  Assessment:   Well woman visit with normal exam. Status post TVH.  Status post TVT.  Mild osteopenia.  Mons pubis cyst that I expect is a sebaceous cyst.  Hair loss.   Plan: Mammogram screening discussed. Recommended self breast awareness. Pap and HR HPV as above. Guidelines for Calcium, Vitamin D, regular exercise program including cardiovascular and weight bearing exercise. Observation of sebaceous cyst.  I do not recommend excision  unless increases in size or becomes infected.  She will follow up with dermatology.  Labs with PCP including hep C. Follow up annually and prn.      After visit summary provided.

## 2016-08-29 NOTE — Patient Instructions (Signed)

## 2016-09-17 ENCOUNTER — Other Ambulatory Visit: Payer: Self-pay

## 2016-09-17 DIAGNOSIS — K219 Gastro-esophageal reflux disease without esophagitis: Secondary | ICD-10-CM

## 2016-09-17 MED ORDER — PANTOPRAZOLE SODIUM 20 MG PO TBEC: 20.0000 mg | DELAYED_RELEASE_TABLET | Freq: Every day | ORAL | 0 refills | Status: DC

## 2016-09-17 NOTE — Telephone Encounter (Signed)
Refill sent for 30 days. 

## 2016-10-18 ENCOUNTER — Other Ambulatory Visit: Payer: Self-pay | Admitting: *Deleted

## 2016-10-18 ENCOUNTER — Encounter: Payer: Self-pay | Admitting: Family Medicine

## 2016-10-18 DIAGNOSIS — K219 Gastro-esophageal reflux disease without esophagitis: Secondary | ICD-10-CM

## 2016-10-18 MED ORDER — PANTOPRAZOLE SODIUM 20 MG PO TBEC: 20.0000 mg | DELAYED_RELEASE_TABLET | Freq: Every day | ORAL | 0 refills | Status: DC

## 2016-10-30 ENCOUNTER — Ambulatory Visit (INDEPENDENT_AMBULATORY_CARE_PROVIDER_SITE_OTHER): Payer: Managed Care, Other (non HMO) | Admitting: Family Medicine

## 2016-10-30 ENCOUNTER — Ambulatory Visit (HOSPITAL_BASED_OUTPATIENT_CLINIC_OR_DEPARTMENT_OTHER)
Admission: RE | Admit: 2016-10-30 | Discharge: 2016-10-30 | Disposition: A | Discharge status: Home / Self Care | Payer: 59 | Source: Ambulatory Visit | Attending: Family Medicine | Admitting: Family Medicine

## 2016-10-30 ENCOUNTER — Encounter: Payer: Self-pay | Admitting: Family Medicine

## 2016-10-30 VITALS — BP 133/86 | HR 78 | Temp 98.7°F | Resp 20 | Wt 176.0 lb

## 2016-10-30 DIAGNOSIS — M50322 Other cervical disc degeneration at C5-C6 level: Secondary | ICD-10-CM | POA: Insufficient documentation

## 2016-10-30 DIAGNOSIS — M542 Cervicalgia: Secondary | ICD-10-CM

## 2016-10-30 DIAGNOSIS — M4802 Spinal stenosis, cervical region: Secondary | ICD-10-CM | POA: Insufficient documentation

## 2016-10-30 DIAGNOSIS — K219 Gastro-esophageal reflux disease without esophagitis: Secondary | ICD-10-CM

## 2016-10-30 MED ORDER — CYCLOBENZAPRINE HCL 10 MG PO TABS: 10.0000 mg | ORAL_TABLET | Freq: Three times a day (TID) | ORAL | 1 refills | Status: DC | PRN

## 2016-10-30 MED ORDER — PANTOPRAZOLE SODIUM 20 MG PO TBEC: 20.0000 mg | DELAYED_RELEASE_TABLET | Freq: Every day | ORAL | 3 refills | Status: DC

## 2016-10-30 NOTE — Patient Instructions (Signed)
Please have xray of neck completed today.  Start flexeril and heat therapy.  Start stretches.  Use tylenol as needed.  If xray positive and/or medications not helpful will investigate further.

## 2016-10-30 NOTE — Progress Notes (Signed)
Patient ID: [name], female   DOB: 1960-12-14, 56 y.o.   MRN: [id number]     [name] , 14-Nov-1960, 56 y.o., female MRN: [id number]  CC: F/u GERD Subjective:   Neck pain: Patient states that she is experiencing new onset left sided neck pain. She states she's had this discomfort for quite a few months, however over the last month it has been worse. She states vacuuming driving and turning her head to the left make her pain increase. She is endorsing now being affected by this discomfort at least 5 out of 7 days of the week. She states she's been told she's had arthritis in her neck in the past, although she does not recall having any x-rays of her neck. She denies any injury that exacerbated her current symptoms. She states she did have time when she was seen And she "jammed" her neck when doing acrobatics. She denies any weakness, numbness or tingling radiating to her arm or hands. The pain is mostly located in her left upper trapezium in left side of her neck. She states when she aggravates it it will feel like a sharp pain and then a dull constant pain for a few hours.  epigastric pain:  Patient reports she is doing well on the Protonix 20 mg daily. She is watching her diet, has never really found any food triggers. She states her GERD has been controlled on this regimen. She is in need of refills today. Prior note:  Patient has complaints of new onset epigastric pain for at least the last week. He states she feels like it's gassy tension. She reports it has been constant for the past week, sometimes more painful than others, but constant. SHe denies any association with meals. She denies any nausea or vomiting. She reports it feels like gas pain, but she is unable to belch. She denies any changes in her bowel habits, movements. She denies any melena or hematochezia. She denies any appetite changes. She had been using NSAIDs for back pain. She has had elevated triglycerides in the  past, recent triglycerides were 229. She has tried simethicone, which has not made any difference to her symptoms. She has never had reflux, but does admit to occasional heartburn. SHe has no history of hiatal hernia, but states her father had one.  Allergies  Allergen Reactions  . Amoxicillin Hives  . Sulfa Antibiotics Rash    Childhood rxn   Social History  Substance Use Topics  . Smoking status: Never Smoker  . Smokeless tobacco: Never Used  . Alcohol use 0.0 oz/week     Comment: ocassional wine-1 glass every 2 weeks   Past Medical History:  Diagnosis Date  . Arthritis    neck  . Diverticulosis of colon   . History of rectocele   . Insomnia    started ambien 5 ya.  . Osteopenia    --left femur  . Periodic heart flutter   . SVD (spontaneous vaginal delivery)    x 2  . Urinary incontinence    Past Surgical History:  Procedure Laterality Date  . BLADDER SUSPENSION N/A 11/10/2013   Procedure: TRANSVAGINAL TAPE (TVT) PROCEDURE;  Surgeon: Jamey Reas de Berton Lan, MD;  Location: [facility];  Service: Gynecology;  Laterality: N/A;  . COLONOSCOPY    . CYSTOSCOPY N/A 11/10/2013   Procedure: CYSTOSCOPY;  Surgeon: Jamey Reas de Berton Lan, MD;  Location: [facility];  Service: Gynecology;  Laterality: N/A;  .  VAGINAL HYSTERECTOMY  11/11    Vag cuff, mild rectocele  . WISDOM TOOTH EXTRACTION     Family History  Problem Relation Age of Onset  . Arthritis Mother   . Hypertension Mother   . Hyperlipidemia Mother   . Heart disease Mother 35    dec  . Hypertension Father   . Hyperlipidemia Father   . Heart disease Father 43    smoked for 10 yrs., no muscle damage, 1 MI   . Pulmonary fibrosis Father     diagnosed end stage, cause unkn.  Marland Kitchen Urolithiasis Father   . Hyperlipidemia Brother   . Urolithiasis Brother   . Diabetes Son   . Asthma Son   . Seizures Son   . Arthritis Maternal Grandmother   . Arthritis Paternal Grandmother   . Asthma Son   .  Diverticulosis Sister    Allergies as of 10/30/2016      Reactions   Amoxicillin Hives   Sulfa Antibiotics Rash   Childhood rxn      Medication List       Accurate as of 10/30/16  1:57 PM. Always use your most recent med list.          cyclobenzaprine 10 MG tablet Commonly known as:  FLEXERIL Take 1 tablet (10 mg total) by mouth 3 (three) times daily as needed for muscle spasms.   KRILL OIL PO Take 1 capsule by mouth daily.   pantoprazole 20 MG tablet Commonly known as:  PROTONIX Take 1 tablet (20 mg total) by mouth daily. Needs office visit for further refills.   Vitamin D 2000 units tablet Take 2,000 Units by mouth daily.   zolpidem 5 MG tablet Commonly known as:  AMBIEN Take 1 tablet (5 mg total) by mouth at bedtime as needed for sleep.      ROS: Negative, with the exception of above mentioned in HPI Objective:  BP 133/86 (BP Location: Left Arm, Patient Position: Sitting, Cuff Size: Normal)   Pulse 78   Temp 98.7 F (37.1 C)   Resp 20   Wt 176 lb (79.8 kg)   SpO2 97%   BMI 29.51 kg/m  Body mass index is 29.51 kg/m. Gen: Afebrile. No acute distress. Nontoxic in appearance, well-developed, well-nourished, Caucasian female. HENT: AT. [place].  MMM.  Eyes:Pupils Equal Round Reactive to light, Extraocular movements intact,  Conjunctiva without redness, discharge or icterus. CV: RRR  Chest: CTAB, no wheeze or crackles Abd: Soft. NTND. BS present. MSK: No erythema, no soft tissue swelling cervical spine. No bony tenderness cervical spine. No current tenderness over paraspinal muscles/trap. Range of motion bilateral arms. Decreased range of motion bilateral cervical side bending and left rotation. Mildly positive empty can test left, otherwise normal muscle strength bilateral upper extremity. Her vascular intact distally. Neuro:  Normal gait. PERLA. EOMi. Alert. Oriented.   Assessment/Plan: [name] is a 56 y.o. female present for acute OV for  Neck pain: -  New - Encouraged heat therapy, range of motion exercises and Flexeril use. Will obtain x-ray of her cervical spine. Depending upon those results may consider physical therapy versus orthopedic/neuro referral. - She is unable to tolerate NSAIDs. May continue Tylenol for pain currently. If worsening could consider tramadol for severe pain.  Epigastric pain: - well controlled.  - No NSAIDs - Pantoprazole 20 mg QD. Refills provided today.  - Is not able to taper off medication, will continue PPI. Will refill yearly routine physical.  Follow-up for a few issues  dependent upon x-ray.  Follow-up yearly for CPE  [facility], DO [place]

## 2016-10-31 ENCOUNTER — Telehealth: Payer: Self-pay | Admitting: Family Medicine

## 2016-10-31 ENCOUNTER — Encounter: Payer: Self-pay | Admitting: *Deleted

## 2016-10-31 DIAGNOSIS — M542 Cervicalgia: Secondary | ICD-10-CM

## 2016-10-31 DIAGNOSIS — M5030 Other cervical disc degeneration, unspecified cervical region: Secondary | ICD-10-CM

## 2016-10-31 NOTE — Telephone Encounter (Signed)
Spoke with patient reviewed xray result and instructions sent copy of instruction in My Chart at patient request.

## 2016-10-31 NOTE — Telephone Encounter (Signed)
Please call pt: - her cervical spine xray resulted with some evidence of cause of her discomfort. Mild bone spurring and disc degeneration signs.  - I would like her to try the heat, flexeril schedule at least every night, and once throughout the day if able to tolerate without sedation, along with the light stretches we discussed.  - I placed an order for her to start physical therapy.  - I would like to follow up with her in 4 weeks, sooner if worsening or can not tolerate PT.

## 2016-11-29 ENCOUNTER — Encounter: Payer: Self-pay | Admitting: Family Medicine

## 2016-11-29 ENCOUNTER — Ambulatory Visit (INDEPENDENT_AMBULATORY_CARE_PROVIDER_SITE_OTHER): Payer: Managed Care, Other (non HMO) | Admitting: Family Medicine

## 2016-11-29 VITALS — BP 136/88 | HR 77 | Temp 98.2°F | Resp 20 | Ht 65.0 in | Wt 178.5 lb

## 2016-11-29 DIAGNOSIS — M542 Cervicalgia: Secondary | ICD-10-CM | POA: Diagnosis not present

## 2016-11-29 MED ORDER — BACLOFEN 10 MG PO TABS: 10.0000 mg | ORAL_TABLET | Freq: Two times a day (BID) | ORAL | 1 refills | Status: DC

## 2016-11-29 NOTE — Progress Notes (Signed)
Patient ID: [name], female   DOB: 03/07/1961, 56 y.o.   MRN: [id number]     [name] , [birth year], 56 y.o., female MRN: [id number]  Chief Complaint  Patient presents with  . Neck Pain    follow up    Subjective:   Neck pain: Patient presents today for follow-up on her neck pain. She has been using the Flexeril at nighttime, sometimes during the day if she can she does have some sedation on this medication. She states the medication is helping extensively. She has also started physical therapy and she has completed 3 sessions. She has had increased range of motion with the start of physical therapy. She is very pleased with the results far. The neck pain is more tolerable. She is continuing the exercises at home and she has approximately 6 more sessions at PT.  Prior note she will third 2018: Patient states that she is experiencing new onset left sided neck pain. She states she's had this discomfort for quite a few months, however over the last month it has been worse. She states vacuuming driving and turning her head to the left make her pain increase. She is endorsing now being affected by this discomfort at least 5 out of 7 days of the week. She states she's been told she's had arthritis in her neck in the past, although she does not recall having any x-rays of her neck. She denies any injury that exacerbated her current symptoms. She states she did have time when she was seen And she "jammed" her neck when doing acrobatics. She denies any weakness, numbness or tingling radiating to her arm or hands. The pain is mostly located in her left upper trapezium in left side of her neck. She states when she aggravates it it will feel like a sharp pain and then a dull constant pain for a few hours.   Allergies  Allergen Reactions  . Amoxicillin Hives  . Sulfa Antibiotics Rash    Childhood rxn   Social History  Substance Use Topics  . Smoking status: Never Smoker  . Smokeless  tobacco: Never Used  . Alcohol use 0.0 oz/week     Comment: ocassional wine-1 glass every 2 weeks   Past Medical History:  Diagnosis Date  . Arthritis    neck  . Diverticulosis of colon   . History of rectocele   . Insomnia    started ambien 5 ya.  . Osteopenia    --left femur  . Periodic heart flutter   . SVD (spontaneous vaginal delivery)    x 2  . Urinary incontinence    Past Surgical History:  Procedure Laterality Date  . BLADDER SUSPENSION N/A 11/10/2013   Procedure: TRANSVAGINAL TAPE (TVT) PROCEDURE;  Surgeon: Jamey Reas de Berton Lan, MD;  Location: [facility];  Service: Gynecology;  Laterality: N/A;  . COLONOSCOPY    . CYSTOSCOPY N/A 11/10/2013   Procedure: CYSTOSCOPY;  Surgeon: Jamey Reas de Berton Lan, MD;  Location: [facility];  Service: Gynecology;  Laterality: N/A;  . VAGINAL HYSTERECTOMY  11/11    Vag cuff, mild rectocele  . WISDOM TOOTH EXTRACTION     Family History  Problem Relation Age of Onset  . Arthritis Mother   . Hypertension Mother   . Hyperlipidemia Mother   . Heart disease Mother 36    dec  . Hypertension Father   . Hyperlipidemia Father   . Heart disease Father 49  smoked for 10 yrs., no muscle damage, 1 MI   . Pulmonary fibrosis Father     diagnosed end stage, cause unkn.  Marland Kitchen Urolithiasis Father   . Hyperlipidemia Brother   . Urolithiasis Brother   . Diabetes Son   . Asthma Son   . Seizures Son   . Arthritis Maternal Grandmother   . Arthritis Paternal Grandmother   . Asthma Son   . Diverticulosis Sister    Allergies as of 11/29/2016      Reactions   Amoxicillin Hives   Sulfa Antibiotics Rash   Childhood rxn      Medication List       Accurate as of 11/29/16 11:34 AM. Always use your most recent med list.          cyclobenzaprine 10 MG tablet Commonly known as:  FLEXERIL Take 1 tablet (10 mg total) by mouth 3 (three) times daily as needed for muscle spasms.   KRILL OIL PO Take 1 capsule by mouth daily.     pantoprazole 20 MG tablet Commonly known as:  PROTONIX Take 1 tablet (20 mg total) by mouth daily. Needs office visit for further refills.   Vitamin D 2000 units tablet Take 2,000 Units by mouth daily.   zolpidem 5 MG tablet Commonly known as:  AMBIEN Take 1 tablet (5 mg total) by mouth at bedtime as needed for sleep.      ROS: Negative, with the exception of above mentioned in HPI Objective:  BP 136/88 (BP Location: Right Arm, Patient Position: Sitting, Cuff Size: Normal)   Pulse 77   Temp 98.2 F (36.8 C)   Resp 20   Ht 5\' 5"  (1.651 m)   Wt 178 lb 8 oz (81 kg)   SpO2 98%   BMI 29.70 kg/m  Body mass index is 29.7 kg/m. Gen: Afebrile. No acute distress.   MSK: No erythema, no soft tissue swelling cervical spine. No bony tenderness of cervical spine. Normal range of motion bilateral arms. Still mild decrease range of motion left side bending and left rotation, however very much improved from prior exam. Neurovascularly intact distally. Neuro: Normal gait. PERLA. EOMi. Alert. Oriented x3   Assessment/Plan: [name] is a 56 y.o. female present for acute OV for  Neck pain: - Improving, continue physical therapy sessions, as well as exercises at home. She is getting pretty good results and better range of motion with PT. - Start baclofen twice a day, patient was advised not to use baclofen and Flexeril at the same time. If wanting to continue Flexeril in the evening she is to only use baclofen one time during the day. - She is unable to tolerate NSAIDs. May continue Tylenol for pain currently. - Patient will follow-up in 3-4 weeks only if needed after continuing physical therapy if still having discomfort, or results are what she desires.     Romain Erion Raoul Pitch, DO [place]

## 2016-11-29 NOTE — Patient Instructions (Signed)
Start baclofen every 12 hours if needed.  Stop flexeril. If needed at night, make sure baclofen dose was 12 hours before.    Followup in 4 weeks after PT completed and doing ROM exercises at home only if needed.

## 2016-12-28 ENCOUNTER — Encounter: Payer: Self-pay | Admitting: Family Medicine

## 2016-12-28 ENCOUNTER — Ambulatory Visit (INDEPENDENT_AMBULATORY_CARE_PROVIDER_SITE_OTHER): Payer: Managed Care, Other (non HMO) | Admitting: Family Medicine

## 2016-12-28 VITALS — BP 131/83 | HR 92 | Temp 98.0°F | Resp 20 | Wt 181.5 lb

## 2016-12-28 DIAGNOSIS — M542 Cervicalgia: Secondary | ICD-10-CM

## 2016-12-28 DIAGNOSIS — G4700 Insomnia, unspecified: Secondary | ICD-10-CM

## 2016-12-28 MED ORDER — ZOLPIDEM TARTRATE 5 MG PO TABS: 5.0000 mg | ORAL_TABLET | Freq: Every evening | ORAL | 5 refills | Status: DC | PRN

## 2016-12-28 NOTE — Progress Notes (Signed)
Patient ID: [name], female   DOB: 25-Mar-1961, 56 y.o.   MRN: [id number]     [name] , [birth year], 56 y.o., female MRN: [id number]  Chief Complaint  Patient presents with  . Neck Pain    follow up    Subjective:  Insomnia: Patient reports insomnia is well controlled on Ambien 5 mg daily at bedtime. She routinely takes his medication. She reports she is in need of refills. She denies any negative side effects.   Neck pain:  Patient reports today for follow-up on her neck pain. She states that she is much improved. She has completely finished physical therapy and sees a big difference in her pain. She is continuing to complete the exercises given to her at home. She is sometimes still needing the baclofen throughout the day. She has noticed still discomfort with turning her head to the left. She would like to continue working on it at home. Prior note Patient presents today for follow-up on her neck pain. She has been using the Flexeril at nighttime, sometimes during the day if she can she does have some sedation on this medication. She states the medication is helping extensively. She has also started physical therapy and she has completed 3 sessions. She has had increased range of motion with the start of physical therapy. She is very pleased with the results far. The neck pain is more tolerable. She is continuing the exercises at home and she has approximately 6 more sessions at PT.  Prior note she will third 2018: Patient states that she is experiencing new onset left sided neck pain. She states she's had this discomfort for quite a few months, however over the last month it has been worse. She states vacuuming driving and turning her head to the left make her pain increase. She is endorsing now being affected by this discomfort at least 5 out of 7 days of the week. She states she's been told she's had arthritis in her neck in the past, although she does not recall having any  x-rays of her neck. She denies any injury that exacerbated her current symptoms. She states she did have time when she was seen And she "jammed" her neck when doing acrobatics. She denies any weakness, numbness or tingling radiating to her arm or hands. The pain is mostly located in her left upper trapezium in left side of her neck. She states when she aggravates it it will feel like a sharp pain and then a dull constant pain for a few hours.   Allergies  Allergen Reactions  . Amoxicillin Hives  . Sulfa Antibiotics Rash    Childhood rxn   Social History  Substance Use Topics  . Smoking status: Never Smoker  . Smokeless tobacco: Never Used  . Alcohol use 0.0 oz/week     Comment: ocassional wine-1 glass every 2 weeks   Past Medical History:  Diagnosis Date  . Arthritis    neck  . Diverticulosis of colon   . History of rectocele   . Insomnia    started ambien 5 ya.  . Osteopenia    --left femur  . Periodic heart flutter   . SVD (spontaneous vaginal delivery)    x 2  . Urinary incontinence    Past Surgical History:  Procedure Laterality Date  . BLADDER SUSPENSION N/A 11/10/2013   Procedure: TRANSVAGINAL TAPE (TVT) PROCEDURE;  Surgeon: Jamey Reas de Berton Lan, MD;  Location: [facility];  Service: Gynecology;  Laterality: N/A;  . COLONOSCOPY    . CYSTOSCOPY N/A 11/10/2013   Procedure: CYSTOSCOPY;  Surgeon: Jamey Reas de Berton Lan, MD;  Location: [facility];  Service: Gynecology;  Laterality: N/A;  . VAGINAL HYSTERECTOMY  11/11    Vag cuff, mild rectocele  . WISDOM TOOTH EXTRACTION     Family History  Problem Relation Age of Onset  . Arthritis Mother   . Hypertension Mother   . Hyperlipidemia Mother   . Heart disease Mother 45       dec  . Hypertension Father   . Hyperlipidemia Father   . Heart disease Father 58       smoked for 10 yrs., no muscle damage, 1 MI   . Pulmonary fibrosis Father        diagnosed end stage, cause unkn.  Marland Kitchen Urolithiasis Father     . Hyperlipidemia Brother   . Urolithiasis Brother   . Diabetes Son   . Asthma Son   . Seizures Son   . Arthritis Maternal Grandmother   . Arthritis Paternal Grandmother   . Asthma Son   . Diverticulosis Sister    Allergies as of 12/28/2016      Reactions   Amoxicillin Hives   Sulfa Antibiotics Rash   Childhood rxn      Medication List       Accurate as of 12/28/16  8:56 AM. Always use your most recent med list.          baclofen 10 MG tablet Commonly known as:  LIORESAL Take 1 tablet (10 mg total) by mouth 2 (two) times daily.   cyclobenzaprine 10 MG tablet Commonly known as:  FLEXERIL Take 1 tablet (10 mg total) by mouth 3 (three) times daily as needed for muscle spasms.   KRILL OIL PO Take 1 capsule by mouth daily.   pantoprazole 20 MG tablet Commonly known as:  PROTONIX Take 1 tablet (20 mg total) by mouth daily. Needs office visit for further refills.   Vitamin D 2000 units tablet Take 2,000 Units by mouth daily.   zolpidem 5 MG tablet Commonly known as:  AMBIEN Take 1 tablet (5 mg total) by mouth at bedtime as needed for sleep.      ROS: Negative, with the exception of above mentioned in HPI Objective:  BP 131/83 (BP Location: Right Arm, Patient Position: Sitting, Cuff Size: Normal)   Pulse 92   Temp 98 F (36.7 C)   Resp 20   Wt 181 lb 8 oz (82.3 kg)   SpO2 96%   BMI 30.20 kg/m  Body mass index is 30.2 kg/m.  Gen: Afebrile. No acute distress.  MSK: No erythema, no cervical spine bony tenderness. Mild fullness left SCM. Normal range of motion bilateral arms. Still some mild decreased range of motion left rotation, all other planes have greatly improved. Neurovascularly intact distally. Neuro: Normal gait. PERLA. EOMi. Alert. Oriented.    Assessment/Plan: [name] is a 56 y.o. female present for acute OV for  Neck pain: - Patient has done great with her physical therapy and exercises. Her range of motion has greatly improved. Her pain  has greatly improved. She would like to continue doing exercises at home to see if she can get added benefit. If it becomes worse, would consider referral at that time. She has been made aware of other possible modalities of treatment including Botox injections, which she states she would like to wait before attempting.  - She does  not need baclofen refills today.  - Follow-up when necessary  Insomnia: - Refills on Ambien provided today for 6 months, follow-up in 6 months.   Renee Raoul Pitch, DO [place]

## 2016-12-28 NOTE — Patient Instructions (Signed)
Very glad you are doing better.  Have fun on your vacation!  Followup only if you need further intervention. Otherwise continue the exercises and strethes at home.   Please help [place] help you:  We are honored you have chosen [facility] for your Primary Care home. Below you will find basic instructions that you may need to access in the future. Please help [place] help you by reading the instructions, which cover many of the frequent questions we experience.   Prescription refills and request:  -In order to allow more efficient response time, please call your pharmacy for all refills. They will forward the request electronically to [place]. This allows for the quickest possible response. Request left on a nurse line can take longer to refill, since these are checked as time allows between office patients and other phone calls.  - refill request can take up to 3-5 working days to complete.  - If request is sent electronically and request is appropiate, it is usually completed in 1-2 business days.  - all patients will need to be seen routinely for all chronic medical conditions requiring prescription medications (see follow-up below). If you are overdue for follow up on your condition, you will be asked to make an appointment and we will call in enough medication to cover you until your appointment (up to 30 days).  - all controlled substances will require a face to face visit to request/refill.  - if you desire your prescriptions to go through a new pharmacy, and have an active script at original pharmacy, you will need to call your pharmacy and have scripts transferred to new pharmacy. This is completed between the pharmacy locations and not by your provider.    Results: If any images or labs were ordered, it can take up to 1 week to get results depending on the test ordered and the lab/facility running and resulting the test. - Normal or stable results, which do not need further discussion, may be  released to your mychart immediately with attached note to you. A call may not be generated for normal results. Please make certain to sign up for mychart. If you have questions on how to activate your mychart you can call the front office.  - If your results need further discussion, our office will attempt to contact you via phone, and if unable to reach you after 2 attempts, we will release your abnormal result to your mychart with instructions.  - All results will be automatically released in mychart after 1 week.  - Your provider will provide you with explanation and instruction on all relevant material in your results. Please keep in mind, results and labs may appear confusing or abnormal to the untrained eye, but it does not mean they are actually abnormal for you personally. If you have any questions about your results that are not covered, or you desire more detailed explanation than what was provided, you should make an appointment with your provider to do so.   Our office handles many outgoing and incoming calls daily. If we have not contacted you within 1 week about your results, please check your mychart to see if there is a message first and if not, then contact our office.  In helping with this matter, you help decrease call volume, and therefore allow [place] to be able to respond to patients needs more efficiently.   Acute office visits (sick visit):  An acute visit is intended for a new problem and are  scheduled in shorter time slots to allow schedule openings for patients with new problems. This is the appropriate visit to discuss a new problem. In order to provide you with excellent quality medical care with proper time for you to explain your problem, have an exam and receive treatment with instructions, these appointments should be limited to one new problem per visit. If you experience a new problem, in which you desire to be addressed, please make an acute office visit, we save openings on  the schedule to accommodate you. Please do not save your new problem for any other type of visit, let us take care of it properly and quickly for you.   Follow up visits:  Depending on your condition(s) your provider will need to see you routinely in order to provide you with quality care and prescribe medication(s). Most chronic conditions (Example: hypertension, Diabetes, depression/anxiety... etc), require visits a couple times a year. Your provider will instruct you on proper follow up for your personal medical conditions and history. Please make certain to make follow up appointments for your condition as instructed. Failing to do so could result in lapse in your medication treatment/refills. If you request a refill, and are overdue to be seen on a condition, we will always provide you with a 30 day script (once) to allow you time to schedule.    Medicare wellness (well visit): - we have a wonderful Nurse Maudie Mercury), that will meet with you and provide you will yearly medicare wellness visits. These visits should occur yearly (can not be scheduled less than 1 calendar year apart) and cover preventive health, immunizations, advance directives and screenings you are entitled to yearly through your medicare benefits. Do not miss out on your entitled benefits, this is when medicare will pay for these benefits to be ordered for you.  These are strongly encouraged by your provider and is the appropriate type of visit to make certain you are up to date with all preventive health benefits. If you have not had your medicare wellness exam in the last 12 months, please make certain to schedule one by calling the office and schedule your medicare wellness with Maudie Mercury as soon as possible.   Yearly physical (well visit):  - Adults are recommended to be seen yearly for physicals. Check with your insurance and date of your last physical, most insurances require one calendar year between physicals. Physicals include all  preventive health topics, screenings, medical exam and labs that are appropriate for gender/age and history. You may have fasting labs needed at this visit. This is a well visit (not a sick visit), new problems should not be covered during this visit (see acute visit).  - Pediatric patients are seen more frequently when they are younger. Your provider will advise you on well child visit timing that is appropriate for your their age. - This is not a medicare wellness visit. Medicare wellness exams do not have an exam portion to the visit. Some medicare companies allow for a physical, some do not allow a yearly physical. If your medicare allows a yearly physical you can schedule the medicare wellness with our nurse Maudie Mercury and have your physical with your provider after, on the same day. Please check with insurance for your full benefits.   Late Policy/No Shows:  - all new patients should arrive 15-30 minutes earlier than appointment to allow [place] time  to  obtain all personal demographics,  insurance information and for you to complete office paperwork. - All  established patients should arrive 10-15 minutes earlier than appointment time to update all information and be checked in .  - In our best efforts to run on time, if you are late for your appointment you will be asked to either reschedule or if able, we will work you back into the schedule. There will be a wait time to work you back in the schedule,  depending on availability.  - If you are unable to make it to your appointment as scheduled, please call 24 hours ahead of time to allow [place] to fill the time slot with someone else who needs to be seen. If you do not cancel your appointment ahead of time, you may be charged a no show fee.

## 2017-01-24 ENCOUNTER — Other Ambulatory Visit: Payer: Self-pay | Admitting: *Deleted

## 2017-01-24 MED ORDER — BACLOFEN 10 MG PO TABS: 10.0000 mg | ORAL_TABLET | Freq: Two times a day (BID) | ORAL | 1 refills | Status: DC

## 2017-01-24 NOTE — Telephone Encounter (Signed)
Request for Baclofen received. Patient last seen 12/28/16 last refill was 11/29/16 60 tabs with 1 refill.

## 2017-03-20 ENCOUNTER — Telehealth: Payer: Self-pay | Admitting: Family Medicine

## 2017-03-20 ENCOUNTER — Encounter: Payer: Self-pay | Admitting: Family Medicine

## 2017-03-20 NOTE — Telephone Encounter (Signed)
Please call pt: - echart message states she needs a letter from a doctor stating why she takes the medication she is prescribed. This is a rather unusual request for travel purposes.  Please ask her why she needs the letter.  - if she needs just her medication list, she may be able to save herself a trip into the office to pick it up, by printing it off her mychart if able. If she can not we can print a list of her medications for her.

## 2017-03-20 NOTE — Telephone Encounter (Signed)
Spoke with patient she had just read somewhere you should have a medication list from your Dr . Let patient know she can print this on My Chart. Let her know if she cant print let us know and we can print this for her.

## 2017-03-25 ENCOUNTER — Other Ambulatory Visit: Payer: Self-pay | Admitting: *Deleted

## 2017-03-25 MED ORDER — BACLOFEN 10 MG PO TABS: 10.0000 mg | ORAL_TABLET | Freq: Two times a day (BID) | ORAL | 1 refills | Status: DC

## 2017-03-30 HISTORY — PX: IRRIGATION AND DEBRIDEMENT SEBACEOUS CYST: SHX5255

## 2017-05-14 ENCOUNTER — Other Ambulatory Visit: Payer: Self-pay | Admitting: Obstetrics and Gynecology

## 2017-05-14 ENCOUNTER — Telehealth: Payer: Self-pay | Admitting: Obstetrics and Gynecology

## 2017-05-14 DIAGNOSIS — M85852 Other specified disorders of bone density and structure, left thigh: Secondary | ICD-10-CM

## 2017-05-14 DIAGNOSIS — Z1231 Encounter for screening mammogram for malignant neoplasm of breast: Secondary | ICD-10-CM

## 2017-05-14 NOTE — Telephone Encounter (Signed)
Patient is calling to get a order for bone density. She is going to [facility] in [facility] on [address]

## 2017-05-14 NOTE — Telephone Encounter (Signed)
Spoke with patient, advised order for BMD placed at [facility]. Advised [facility] directly to schedule, declined assistance with scheduling. Patient verbalizes understanding and is agreeable.  Order placed for BMD, Hx of osteopenia of left femur. Last BMD 06/20/15.  Routing to provider for final review. Patient is agreeable to disposition. Will close encounter.

## 2017-05-15 ENCOUNTER — Other Ambulatory Visit: Payer: Self-pay | Admitting: *Deleted

## 2017-05-15 MED ORDER — BACLOFEN 10 MG PO TABS: 10.0000 mg | ORAL_TABLET | Freq: Two times a day (BID) | ORAL | 1 refills | Status: DC

## 2017-05-17 ENCOUNTER — Ambulatory Visit (INDEPENDENT_AMBULATORY_CARE_PROVIDER_SITE_OTHER): Payer: Managed Care, Other (non HMO) | Admitting: Family Medicine

## 2017-05-17 ENCOUNTER — Encounter: Payer: Self-pay | Admitting: Family Medicine

## 2017-05-17 VITALS — BP 135/79 | HR 77 | Temp 98.5°F | Resp 18 | Ht 65.0 in | Wt 172.5 lb

## 2017-05-17 DIAGNOSIS — Z23 Encounter for immunization: Secondary | ICD-10-CM | POA: Diagnosis not present

## 2017-05-17 DIAGNOSIS — M722 Plantar fascial fibromatosis: Secondary | ICD-10-CM | POA: Diagnosis not present

## 2017-05-17 NOTE — Progress Notes (Signed)
[name] , Jul 21, 1961, 56 y.o., female MRN: [id number] Patient Care Team    Relationship Specialty Notifications Start End  Ma Hillock, DO PCP - General Family Medicine  03/29/15     Chief Complaint  Patient presents with  . Foot Pain    left heel     Subjective: Pt presents for an OV with complaints of Left heel pain started the end of August.  Associated symptoms include left heel pain that is worse in the morning, when she first starts walking after sitting and at night. She reports the pain is better after she is walking and up for a while. She has tried nothing for the discomfort.  No flowsheet data found.  Allergies  Allergen Reactions  . Amoxicillin Hives  . Sulfa Antibiotics Rash    Childhood rxn   Social History  Substance Use Topics  . Smoking status: Never Smoker  . Smokeless tobacco: Never Used  . Alcohol use 0.0 oz/week     Comment: ocassional wine-1 glass every 2 weeks   Past Medical History:  Diagnosis Date  . Arthritis    neck  . Diverticulosis of colon   . History of rectocele   . Insomnia    started ambien 5 ya.  . Osteopenia    --left femur  . Periodic heart flutter   . SVD (spontaneous vaginal delivery)    x 2  . Urinary incontinence    Past Surgical History:  Procedure Laterality Date  . BLADDER SUSPENSION N/A 11/10/2013   Procedure: TRANSVAGINAL TAPE (TVT) PROCEDURE;  Surgeon: Jamey Reas de Berton Lan, MD;  Location: [facility];  Service: Gynecology;  Laterality: N/A;  . COLONOSCOPY    . CYSTOSCOPY N/A 11/10/2013   Procedure: CYSTOSCOPY;  Surgeon: Jamey Reas de Berton Lan, MD;  Location: [facility];  Service: Gynecology;  Laterality: N/A;  . VAGINAL HYSTERECTOMY  11/11    Vag cuff, mild rectocele  . WISDOM TOOTH EXTRACTION     Family History  Problem Relation Age of Onset  . Arthritis Mother   . Hypertension Mother   . Hyperlipidemia Mother   . Heart disease Mother 71       dec  . Hypertension Father     . Hyperlipidemia Father   . Heart disease Father 39       smoked for 10 yrs., no muscle damage, 1 MI   . Pulmonary fibrosis Father        diagnosed end stage, cause unkn.  Marland Kitchen Urolithiasis Father   . Hyperlipidemia Brother   . Urolithiasis Brother   . Diabetes Son   . Asthma Son   . Seizures Son   . Arthritis Maternal Grandmother   . Arthritis Paternal Grandmother   . Asthma Son   . Diverticulosis Sister    Allergies as of 05/17/2017      Reactions   Amoxicillin Hives   Sulfa Antibiotics Rash   Childhood rxn      Medication List       Accurate as of 05/17/17 10:59 AM. Always use your most recent med list.          baclofen 10 MG tablet Commonly known as:  LIORESAL Take 1 tablet (10 mg total) by mouth 2 (two) times daily.   KRILL OIL PO Take 1 capsule by mouth daily.   pantoprazole 20 MG tablet Commonly known as:  PROTONIX Take 1 tablet (20 mg total) by mouth daily. Needs office visit  for further refills.   Vitamin D 2000 units tablet Take 2,000 Units by mouth daily.   zolpidem 5 MG tablet Commonly known as:  AMBIEN Take 1 tablet (5 mg total) by mouth at bedtime as needed for sleep.       All past medical history, surgical history, allergies, family history, immunizations andmedications were updated in the EMR today and reviewed under the history and medication portions of their EMR.     ROS: Negative, with the exception of above mentioned in HPI   Objective:  BP 135/79 (BP Location: Left Arm, Patient Position: Sitting, Cuff Size: Large)   Pulse 77   Temp 98.5 F (36.9 C)   Resp 18   Ht 5\' 5"  (1.651 m)   Wt 172 lb 8 oz (78.2 kg)   SpO2 97%   BMI 28.71 kg/m  Body mass index is 28.71 kg/m. Gen: Afebrile. No acute distress. Nontoxic in appearance, well developed, well nourished.  MSK: No erythema, no soft tissue swelling. Mild tenderness to palpation medial arch of left foot and plantar fascia. No tenderness over calcaneus. Neurovascularly intact  distally. Neuro:  Normal gait. . Oriented x3   No exam data present No results found. No results found for this or any previous visit (from the past 24 hour(s)).  Assessment/Plan: [name] is a 56 y.o. female present for OV for  Influenza vaccine administered - Flu Vaccine QUAD 6+ mos PF IM (Fluarix Quad PF)  Plantar fasciitis, left - Symptoms Rather classic for plantar fasciitis of her left foot. Discussed remedies to help including freezing a bottle of water and rolling her foot at the end of the day. Stretches provided. Encouraged heel cups and supportive shoes. - Discussed natural course of plantar fasciitis, and she will follow-up if worsening or not resolved within 3 months.  *Flu shot administered today   Reviewed expectations re: course of current medical issues.  Discussed self-management of symptoms.  Outlined signs and symptoms indicating need for more acute intervention.  Patient verbalized understanding and all questions were answered.  Patient received an After-Visit Summary.    Orders Placed This Encounter  Procedures  . Flu Vaccine QUAD 6+ mos PF IM (Fluarix Quad PF)     Note is dictated utilizing voice recognition software. Although note has been proof read prior to signing, occasional typographical errors still can be missed. If any questions arise, please do not hesitate to call for verification.   electronically signed by:  Howard Pouch, DO  [place]

## 2017-05-17 NOTE — Patient Instructions (Signed)

## 2017-07-01 ENCOUNTER — Encounter: Payer: Self-pay | Admitting: *Deleted

## 2017-07-03 ENCOUNTER — Ambulatory Visit: Payer: Managed Care, Other (non HMO) | Admitting: Family Medicine

## 2017-07-03 ENCOUNTER — Encounter: Payer: Self-pay | Admitting: Family Medicine

## 2017-07-03 VITALS — BP 130/77 | HR 72 | Temp 98.2°F | Wt 173.0 lb

## 2017-07-03 DIAGNOSIS — G4700 Insomnia, unspecified: Secondary | ICD-10-CM | POA: Diagnosis not present

## 2017-07-03 MED ORDER — ZOLPIDEM TARTRATE 5 MG PO TABS: 5.0000 mg | ORAL_TABLET | Freq: Every evening | ORAL | 1 refills | Status: DC | PRN

## 2017-07-03 NOTE — Patient Instructions (Addendum)
I am glad you are doing well.  I have refilled your medications today.   I hope you have great holidays.

## 2017-07-03 NOTE — Progress Notes (Signed)
[name] , November 02, 1960, 57 y.o., female MRN: [id number] Patient Care Team    Relationship Specialty Notifications Start End  Ma Hillock, DO PCP - General Family Medicine  03/29/15     Chief Complaint  Patient presents with  . Insomnia     Subjective:   Insomnia:  Patient reports insomnia is well controlled on Ambien 5 mg daily at bedtime. She has been on this medication since ~2012. She routinely takes his medication,sometimes only a half tab. She denies negative side effects. She needed refills on his medication today.  Depression screen PHQ 2/9 05/17/2017  Decreased Interest 0  Down, Depressed, Hopeless 0  PHQ - 2 Score 0    Allergies  Allergen Reactions  . Amoxicillin Hives  . Sulfa Antibiotics Rash    Childhood rxn   Social History   Tobacco Use  . Smoking status: Never Smoker  . Smokeless tobacco: Never Used  Substance Use Topics  . Alcohol use: Yes    Alcohol/week: 0.0 oz    Comment: ocassional wine-1 glass every 2 weeks   Past Medical History:  Diagnosis Date  . Arthritis    neck  . Diverticulosis of colon   . History of rectocele   . Insomnia    started ambien 5 ya.  . Osteopenia    --left femur  . Periodic heart flutter   . SVD (spontaneous vaginal delivery)    x 2  . Urinary incontinence    Past Surgical History:  Procedure Laterality Date  . BLADDER SUSPENSION N/A 11/10/2013   Procedure: TRANSVAGINAL TAPE (TVT) PROCEDURE;  Surgeon: Janice Reas de Berton Lan, MD;  Location: [facility];  Service: Gynecology;  Laterality: N/A;  . COLONOSCOPY    . CYSTOSCOPY N/A 11/10/2013   Procedure: CYSTOSCOPY;  Surgeon: Janice Reas de Berton Lan, MD;  Location: [facility];  Service: Gynecology;  Laterality: N/A;  . VAGINAL HYSTERECTOMY  11/11    Vag cuff, mild rectocele  . WISDOM TOOTH EXTRACTION     Family History  Problem Relation Age of Onset  . Arthritis Mother   . Hypertension Mother   . Hyperlipidemia Mother   . Heart  disease Mother 62       dec  . Hypertension Father   . Hyperlipidemia Father   . Heart disease Father 35       smoked for 10 yrs., no muscle damage, 1 MI   . Pulmonary fibrosis Father        diagnosed end stage, cause unkn.  Marland Kitchen Urolithiasis Father   . Hyperlipidemia Brother   . Urolithiasis Brother   . Diabetes Son   . Asthma Son   . Seizures Son   . Arthritis Maternal Grandmother   . Arthritis Paternal Grandmother   . Asthma Son   . Diverticulosis Sister    Allergies as of 07/03/2017      Reactions   Amoxicillin Hives   Sulfa Antibiotics Rash   Childhood rxn      Medication List        Accurate as of 07/03/17  9:19 AM. Always use your most recent med list.          baclofen 10 MG tablet Commonly known as:  LIORESAL Take 1 tablet (10 mg total) by mouth 2 (two) times daily.   KRILL OIL PO Take 1 capsule by mouth daily.   pantoprazole 20 MG tablet Commonly known as:  PROTONIX Take 1 tablet (20 mg total)  by mouth daily. Needs office visit for further refills.   Vitamin D 2000 units tablet Take 2,000 Units by mouth daily.   zolpidem 5 MG tablet Commonly known as:  AMBIEN Take 1 tablet (5 mg total) by mouth at bedtime as needed for sleep.       All past medical history, surgical history, allergies, family history, immunizations andmedications were updated in the EMR today and reviewed under the history and medication portions of their EMR.     ROS: Negative, with the exception of above mentioned in HPI   Objective:  BP 130/77 (BP Location: Left Arm, Patient Position: Sitting, Cuff Size: Normal)   Pulse 72   Temp 98.2 F (36.8 C)   Wt 173 lb (78.5 kg)   SpO2 98%   BMI 28.79 kg/m  Body mass index is 28.79 kg/m. Gen: Afebrile. No acute distress. Nontoxic in appearance, well developed, well nourished.  HENT: AT. [place]. MMM, no oral lesions.  Eyes:Pupils Equal Round Reactive to light, Extraocular movements intact,  Conjunctiva without redness, discharge or  icterus. Neuro:  Normal gait. PERLA. EOMi. Alert. Oriented x3  Psych: Normal affect, dress and demeanor. Normal speech. Normal thought content and judgment.  No exam data present No results found. No results found for this or any previous visit (from the past 24 hour(s)).  Assessment/Plan: [name] is a 56 y.o. female present for OV for  Insomnia, unspecified type - refills on ambien 5 mg QHS today.  - [place] reviewed and part of permanent record.  - Contract signed.  - F/U 6 month   Reviewed expectations re: course of current medical issues.  Discussed self-management of symptoms.  Outlined signs and symptoms indicating need for more acute intervention.  Patient verbalized understanding and all questions were answered.  Patient received an After-Visit Summary.    No orders of the defined types were placed in this encounter.    Note is dictated utilizing voice recognition software. Although note has been proof read prior to signing, occasional typographical errors still can be missed. If any questions arise, please do not hesitate to call for verification.   electronically signed by:  Howard Pouch, DO  [place]

## 2017-07-08 ENCOUNTER — Other Ambulatory Visit (HOSPITAL_BASED_OUTPATIENT_CLINIC_OR_DEPARTMENT_OTHER): Payer: Managed Care, Other (non HMO)

## 2017-07-08 ENCOUNTER — Ambulatory Visit (HOSPITAL_BASED_OUTPATIENT_CLINIC_OR_DEPARTMENT_OTHER): Payer: Managed Care, Other (non HMO)

## 2017-07-09 ENCOUNTER — Encounter (HOSPITAL_BASED_OUTPATIENT_CLINIC_OR_DEPARTMENT_OTHER): Payer: Self-pay

## 2017-07-09 ENCOUNTER — Ambulatory Visit (HOSPITAL_BASED_OUTPATIENT_CLINIC_OR_DEPARTMENT_OTHER)
Admission: RE | Admit: 2017-07-09 | Discharge: 2017-07-09 | Disposition: A | Discharge status: Home / Self Care | Payer: 59 | Source: Ambulatory Visit | Attending: Obstetrics and Gynecology | Admitting: Obstetrics and Gynecology

## 2017-07-09 DIAGNOSIS — M85851 Other specified disorders of bone density and structure, right thigh: Secondary | ICD-10-CM | POA: Insufficient documentation

## 2017-07-09 DIAGNOSIS — Z1231 Encounter for screening mammogram for malignant neoplasm of breast: Secondary | ICD-10-CM

## 2017-07-09 DIAGNOSIS — M85852 Other specified disorders of bone density and structure, left thigh: Secondary | ICD-10-CM | POA: Diagnosis present

## 2017-07-12 ENCOUNTER — Other Ambulatory Visit: Payer: Self-pay | Admitting: *Deleted

## 2017-07-12 MED ORDER — BACLOFEN 10 MG PO TABS: 10.0000 mg | ORAL_TABLET | Freq: Two times a day (BID) | ORAL | 1 refills | Status: DC

## 2017-09-11 ENCOUNTER — Ambulatory Visit: Payer: PRIVATE HEALTH INSURANCE | Admitting: Obstetrics and Gynecology

## 2017-09-18 ENCOUNTER — Ambulatory Visit: Payer: PRIVATE HEALTH INSURANCE | Admitting: Obstetrics and Gynecology

## 2017-10-17 ENCOUNTER — Encounter: Payer: Self-pay | Admitting: Obstetrics and Gynecology

## 2017-10-17 ENCOUNTER — Ambulatory Visit: Payer: PRIVATE HEALTH INSURANCE | Admitting: Obstetrics and Gynecology

## 2017-10-17 ENCOUNTER — Other Ambulatory Visit: Payer: Self-pay

## 2017-10-17 VITALS — BP 128/70 | HR 68 | Resp 16 | Ht 63.75 in | Wt 172.0 lb

## 2017-10-17 DIAGNOSIS — Z01419 Encounter for gynecological examination (general) (routine) without abnormal findings: Secondary | ICD-10-CM

## 2017-10-17 NOTE — Progress Notes (Signed)
57 y.o. G2P2 Married Caucasian female here for annual exam.    Has gained weight.  Not exercising.  Likes sugar!  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  Went to urgent care and had it drained.   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   No problems with bladder or bowel function.   Went to [place] in September 2018.  PCP: Dr. Howard Pouch.  Patient's last menstrual period was 07/30/2009 (within years).           Sexually active: Yes.    The current method of family planning is status post hysterectomy.    Exercising: Yes.    walking Smoker:  no  Health Maintenance: Pap:  2013 normal History of abnormal Pap:  no MMG:  07/09/17 BIRADS 1 negative/density b Colonoscopy:  2011 normal in [place] due 2021 BMD:   07/09/17  Result  Mild Osteopenia.   TDaP:  2014 Gardasil:   n/a HIV: donates blood Hep C: donates blood Screening Labs:  PCP   reports that she has never smoked. She has never used smokeless tobacco. She reports that she drinks alcohol. She reports that she does not use drugs.  Past Medical History:  Diagnosis Date  . Arthritis    neck  . Diverticulosis of colon   . Elevated cholesterol with elevated triglycerides   . History of rectocele   . Insomnia    started ambien 5 ya.  . Osteopenia    --left femur  . Periodic heart flutter   . SVD (spontaneous vaginal delivery)    x 2  . Urinary incontinence     Past Surgical History:  Procedure Laterality Date  . BLADDER SUSPENSION N/A 11/10/2013   Procedure: TRANSVAGINAL TAPE (TVT) PROCEDURE;  Surgeon: Jamey Reas de Berton Lan, MD;  Location: [facility];  Service: Gynecology;  Laterality: N/A;  . COLONOSCOPY    . CYSTOSCOPY N/A 11/10/2013   Procedure: CYSTOSCOPY;  Surgeon: Jamey Reas de Berton Lan, MD;  Location: [facility];  Service: Gynecology;  Laterality: N/A;  . IRRIGATION AND DEBRIDEMENT SEBACEOUS CYST  03/2017  . VAGINAL HYSTERECTOMY  11/11    Vag cuff, mild rectocele  .  WISDOM TOOTH EXTRACTION      Current Outpatient Medications  Medication Sig Dispense Refill  . baclofen (LIORESAL) 10 MG tablet Take 1 tablet (10 mg total) by mouth 2 (two) times daily. 60 each 1  . KRILL OIL PO Take 1 capsule by mouth daily.    . pantoprazole (PROTONIX) 20 MG tablet Take 1 tablet (20 mg total) by mouth daily. Needs office visit for further refills. 90 tablet 3  . zolpidem (AMBIEN) 5 MG tablet Take 1 tablet (5 mg total) by mouth at bedtime as needed for sleep. 90 tablet 1   No current facility-administered medications for this visit.     Family History  Problem Relation Age of Onset  . Arthritis Mother   . Hypertension Mother   . Hyperlipidemia Mother   . Heart disease Mother 66       dec  . Hypertension Father   . Hyperlipidemia Father   . Heart disease Father 74       smoked for 10 yrs., no muscle damage, 1 MI   . Pulmonary fibrosis Father        diagnosed end stage, cause unkn.  Marland Kitchen Urolithiasis Father   . Hyperlipidemia Brother   . Urolithiasis Brother   . Diabetes Son   .  Asthma Son   . Seizures Son   . Arthritis Maternal Grandmother   . Arthritis Paternal Grandmother   . Asthma Son   . Diverticulosis Sister     ROS:  Pertinent items are noted in HPI.  Otherwise, a comprehensive ROS was negative.  Exam:   BP 128/70 (BP Location: Right Arm, Patient Position: Sitting, Cuff Size: Normal)   Pulse 68   Resp 16   Ht 5' 3.75" (1.619 m)   Wt 172 lb (78 kg)   LMP 07/30/2009 (Within Years)   BMI 29.76 kg/m     General appearance: alert, cooperative and appears stated age Head: Normocephalic, without obvious abnormality, atraumatic Neck: no adenopathy, supple, symmetrical, trachea midline and thyroid normal to inspection and palpation Lungs: clear to auscultation bilaterally Breasts: normal appearance, no masses or tenderness, No nipple retraction or dimpling, No nipple discharge or bleeding, No axillary or supraclavicular adenopathy Heart: regular rate  and rhythm Abdomen: soft, non-tender; no masses, no organomegaly Extremities: extremities normal, atraumatic, no cyanosis or edema Skin: Skin color, texture, turgor normal. No rashes or lesions Lymph nodes: Cervical, supraclavicular, and axillary nodes normal. No abnormal inguinal nodes palpated Neurologic: Grossly normal  Pelvic: External genitalia:  no lesions              Urethra:  normal appearing urethra with no masses, tenderness or lesions              Bartholins and Skenes: normal                 Vagina: normal appearing vagina with normal color and discharge, no lesions              Cervix:  absent              Pap taken: No. Bimanual Exam:  Uterus:   absent              Adnexa: no mass, fullness, tenderness              Rectal exam: Yes.  .  Confirms.              Anus:  normal sphincter tone, no lesions  Chaperone was present for exam.  Assessment:   Well woman visit with normal exam. Status post TVH.  Status post TVT.  Mild osteopenia.  Elevated cholesterol and TG. FH CAD.   Plan: Mammogram screening discussed. Recommended self breast awareness. Pap and HR HPV as above. Guidelines for Calcium, Vitamin D, regular exercise program including cardiovascular and weight bearing exercise. We talked about weight loss through diet and exercise.  She will see her PCP and doing fasting labs.  Follow up annually and prn.    After visit summary provided.

## 2017-10-17 NOTE — Patient Instructions (Signed)

## 2017-10-30 ENCOUNTER — Encounter: Payer: Self-pay | Admitting: Family Medicine

## 2017-10-30 ENCOUNTER — Ambulatory Visit (INDEPENDENT_AMBULATORY_CARE_PROVIDER_SITE_OTHER): Payer: Managed Care, Other (non HMO) | Admitting: Family Medicine

## 2017-10-30 VITALS — BP 128/86 | HR 73 | Temp 98.4°F | Resp 20 | Ht 64.0 in | Wt 172.0 lb

## 2017-10-30 DIAGNOSIS — Z131 Encounter for screening for diabetes mellitus: Secondary | ICD-10-CM

## 2017-10-30 DIAGNOSIS — Z114 Encounter for screening for human immunodeficiency virus [HIV]: Secondary | ICD-10-CM

## 2017-10-30 DIAGNOSIS — E781 Pure hyperglyceridemia: Secondary | ICD-10-CM | POA: Diagnosis not present

## 2017-10-30 DIAGNOSIS — E559 Vitamin D deficiency, unspecified: Secondary | ICD-10-CM

## 2017-10-30 DIAGNOSIS — Z1159 Encounter for screening for other viral diseases: Secondary | ICD-10-CM

## 2017-10-30 DIAGNOSIS — Z23 Encounter for immunization: Secondary | ICD-10-CM

## 2017-10-30 DIAGNOSIS — G4700 Insomnia, unspecified: Secondary | ICD-10-CM | POA: Diagnosis not present

## 2017-10-30 DIAGNOSIS — H9313 Tinnitus, bilateral: Secondary | ICD-10-CM

## 2017-10-30 DIAGNOSIS — Z13220 Encounter for screening for lipoid disorders: Secondary | ICD-10-CM

## 2017-10-30 DIAGNOSIS — Z79899 Other long term (current) drug therapy: Secondary | ICD-10-CM

## 2017-10-30 DIAGNOSIS — Z130 Encounter for screening for diseases of the blood and blood-forming organs and certain disorders involving the immune mechanism: Secondary | ICD-10-CM

## 2017-10-30 DIAGNOSIS — Z0001 Encounter for general adult medical examination with abnormal findings: Secondary | ICD-10-CM | POA: Diagnosis not present

## 2017-10-30 DIAGNOSIS — M8580 Other specified disorders of bone density and structure, unspecified site: Secondary | ICD-10-CM

## 2017-10-30 LAB — COMPREHENSIVE METABOLIC PANEL
ALK PHOS: 90 U/L (ref 39–117)
ALT: 17 U/L (ref 0–35)
AST: 12 U/L (ref 0–37)
Albumin: 4.3 g/dL (ref 3.5–5.2)
BILIRUBIN TOTAL: 0.5 mg/dL (ref 0.2–1.2)
BUN: 14 mg/dL (ref 6–23)
CALCIUM: 9.7 mg/dL (ref 8.4–10.5)
CO2: 27 meq/L (ref 19–32)
CREATININE: 0.67 mg/dL (ref 0.40–1.20)
Chloride: 105 mEq/L (ref 96–112)
GFR: 95.94 mL/min (ref >60.00)
Glucose, Bld: 101 mg/dL — ABNORMAL HIGH (ref 70–99)
Potassium: 4.2 mEq/L (ref 3.5–5.1)
Sodium: 141 mEq/L (ref 135–145)
TOTAL PROTEIN: 7 g/dL (ref 6.0–8.3)

## 2017-10-30 LAB — CBC WITH DIFFERENTIAL/PLATELET
BASOS ABS: 0.1 10*3/uL (ref 0.0–0.1)
Basophils Relative: 1.1 % (ref 0.0–3.0)
EOS ABS: 0.1 10*3/uL (ref 0.0–0.7)
Eosinophils Relative: 1.3 % (ref 0.0–5.0)
HEMATOCRIT: 43.8 % (ref 36.0–46.0)
HEMOGLOBIN: 14.7 g/dL (ref 12.0–15.0)
LYMPHS PCT: 30.3 % (ref 12.0–46.0)
Lymphs Abs: 1.9 10*3/uL (ref 0.7–4.0)
MCHC: 33.6 g/dL (ref 30.0–36.0)
MCV: 88.5 fl (ref 78.0–100.0)
Monocytes Absolute: 0.5 10*3/uL (ref 0.1–1.0)
Monocytes Relative: 7.8 % (ref 3.0–12.0)
NEUTROS ABS: 3.8 10*3/uL (ref 1.4–7.7)
Neutrophils Relative %: 59.5 % (ref 43.0–77.0)
PLATELETS: 310 10*3/uL (ref 150.0–400.0)
RBC: 4.94 Mil/uL (ref 3.87–5.11)
RDW: 15.3 % (ref 11.5–15.5)
WBC: 6.3 10*3/uL (ref 4.0–10.5)

## 2017-10-30 LAB — LIPID PANEL
Cholesterol: 242 mg/dL — ABNORMAL HIGH (ref 0–200)
HDL: 41.5 mg/dL (ref >39.00)
NONHDL: 200.84
TRIGLYCERIDES: 299 mg/dL — AB (ref 0.0–149.0)
Total CHOL/HDL Ratio: 6
VLDL: 59.8 mg/dL — ABNORMAL HIGH (ref 0.0–40.0)

## 2017-10-30 LAB — TSH: TSH: 1.55 u[IU]/mL (ref 0.35–4.50)

## 2017-10-30 LAB — LDL CHOLESTEROL, DIRECT: LDL DIRECT: 154 mg/dL

## 2017-10-30 LAB — VITAMIN D 25 HYDROXY (VIT D DEFICIENCY, FRACTURES): VITD: 25.12 ng/mL — ABNORMAL LOW (ref 30.00–100.00)

## 2017-10-30 LAB — HEMOGLOBIN A1C: HEMOGLOBIN A1C: 6.1 % (ref 4.6–6.5)

## 2017-10-30 MED ORDER — ZOLPIDEM TARTRATE 5 MG PO TABS: 5.0000 mg | ORAL_TABLET | Freq: Every evening | ORAL | 1 refills | Status: DC | PRN

## 2017-10-30 NOTE — Patient Instructions (Addendum)
Health Maintenance, Female Adopting a healthy lifestyle and getting preventive care can go a long way to promote health and wellness. Talk with your health care provider about what schedule of regular examinations is right for you. This is a good chance for you to check in with your provider about disease prevention and staying healthy. In between checkups, there are plenty of things you can do on your own. Experts have done a lot of research about which lifestyle changes and preventive measures are most likely to keep you healthy. Ask your health care provider for more information. Weight and diet Eat a healthy diet  Be sure to include plenty of vegetables, fruits, low-fat dairy products, and lean protein.  Do not eat a lot of foods high in solid fats, added sugars, or salt.  Get regular exercise. This is one of the most important things you can do for your health. ? Most adults should exercise for at least 150 minutes each week. The exercise should increase your heart rate and make you sweat (moderate-intensity exercise). ? Most adults should also do strengthening exercises at least twice a week. This is in addition to the moderate-intensity exercise.  Maintain a healthy weight  Body mass index (BMI) is a measurement that can be used to identify possible weight problems. It estimates body fat based on height and weight. Your health care provider can help determine your BMI and help you achieve or maintain a healthy weight.  For females 20 years of age and older: ? A BMI below 18.5 is considered underweight. ? A BMI of 18.5 to 24.9 is normal. ? A BMI of 25 to 29.9 is considered overweight. ? A BMI of 30 and above is considered obese.  Watch levels of cholesterol and blood lipids  You should start having your blood tested for lipids and cholesterol at 59 years of age, then have this test every 5 years.  You may need to have your cholesterol levels checked more often if: ? Your lipid or  cholesterol levels are high. ? You are older than 59 years of age. ? You are at high risk for heart disease.  Cancer screening Lung Cancer  Lung cancer screening is recommended for adults 55-80 years old who are at high risk for lung cancer because of a history of smoking.  A yearly low-dose CT scan of the lungs is recommended for people who: ? Currently smoke. ? Have quit within the past 15 years. ? Have at least a 30-pack-year history of smoking. A pack year is smoking an average of one pack of cigarettes a day for 1 year.  Yearly screening should continue until it has been 15 years since you quit.  Yearly screening should stop if you develop a health problem that would prevent you from having lung cancer treatment.  Breast Cancer  Practice breast self-awareness. This means understanding how your breasts normally appear and feel.  It also means doing regular breast self-exams. Let your health care provider know about any changes, no matter how small.  If you are in your 20s or 30s, you should have a clinical breast exam (CBE) by a health care provider every 1-3 years as part of a regular health exam.  If you are 40 or older, have a CBE every year. Also consider having a breast X-ray (mammogram) every year.  If you have a family history of breast cancer, talk to your health care provider about genetic screening.  If you are at high risk   for breast cancer, talk to your health care provider about having an MRI and a mammogram every year.  Breast cancer gene (BRCA) assessment is recommended for women who have family members with BRCA-related cancers. BRCA-related cancers include: ? Breast. ? Ovarian. ? Tubal. ? Peritoneal cancers.  Results of the assessment will determine the need for genetic counseling and BRCA1 and BRCA2 testing.  Cervical Cancer Your health care provider may recommend that you be screened regularly for cancer of the pelvic organs (ovaries, uterus, and  vagina). This screening involves a pelvic examination, including checking for microscopic changes to the surface of your cervix (Pap test). You may be encouraged to have this screening done every 3 years, beginning at age 22.  For women ages 56-65, health care providers may recommend pelvic exams and Pap testing every 3 years, or they may recommend the Pap and pelvic exam, combined with testing for human papilloma virus (HPV), every 5 years. Some types of HPV increase your risk of cervical cancer. Testing for HPV may also be done on women of any age with unclear Pap test results.  Other health care providers may not recommend any screening for nonpregnant women who are considered low risk for pelvic cancer and who do not have symptoms. Ask your health care provider if a screening pelvic exam is right for you.  If you have had past treatment for cervical cancer or a condition that could lead to cancer, you need Pap tests and screening for cancer for at least 20 years after your treatment. If Pap tests have been discontinued, your risk factors (such as having a new sexual partner) need to be reassessed to determine if screening should resume. Some women have medical problems that increase the chance of getting cervical cancer. In these cases, your health care provider may recommend more frequent screening and Pap tests.  Colorectal Cancer  This type of cancer can be detected and often prevented.  Routine colorectal cancer screening usually begins at 59 years of age and continues through 59 years of age.  Your health care provider may recommend screening at an earlier age if you have risk factors for colon cancer.  Your health care provider may also recommend using home test kits to check for hidden blood in the stool.  A small camera at the end of a tube can be used to examine your colon directly (sigmoidoscopy or colonoscopy). This is done to check for the earliest forms of colorectal  cancer.  Routine screening usually begins at age 33.  Direct examination of the colon should be repeated every 5-10 years through 59 years of age. However, you may need to be screened more often if early forms of precancerous polyps or small growths are found.  Skin Cancer  Check your skin from head to toe regularly.  Tell your health care provider about any new moles or changes in moles, especially if there is a change in a mole's shape or color.  Also tell your health care provider if you have a mole that is larger than the size of a pencil eraser.  Always use sunscreen. Apply sunscreen liberally and repeatedly throughout the day.  Protect yourself by wearing long sleeves, pants, a wide-brimmed hat, and sunglasses whenever you are outside.  Heart disease, diabetes, and high blood pressure  High blood pressure causes heart disease and increases the risk of stroke. High blood pressure is more likely to develop in: ? People who have blood pressure in the high end of  the normal range (130-139/85-89 mm Hg). ? People who are overweight or obese. ? People who are African American.  If you are 21-29 years of age, have your blood pressure checked every 3-5 years. If you are 3 years of age or older, have your blood pressure checked every year. You should have your blood pressure measured twice-once when you are at a hospital or clinic, and once when you are not at a hospital or clinic. Record the average of the two measurements. To check your blood pressure when you are not at a hospital or clinic, you can use: ? An automated blood pressure machine at a pharmacy. ? A home blood pressure monitor.  If you are between 17 years and 37 years old, ask your health care provider if you should take aspirin to prevent strokes.  Have regular diabetes screenings. This involves taking a blood sample to check your fasting blood sugar level. ? If you are at a normal weight and have a low risk for diabetes,  have this test once every three years after 59 years of age. ? If you are overweight and have a high risk for diabetes, consider being tested at a younger age or more often. Preventing infection Hepatitis B  If you have a higher risk for hepatitis B, you should be screened for this virus. You are considered at high risk for hepatitis B if: ? You were born in a country where hepatitis B is common. Ask your health care provider which countries are considered high risk. ? Your parents were born in a high-risk country, and you have not been immunized against hepatitis B (hepatitis B vaccine). ? You have HIV or AIDS. ? You use needles to inject street drugs. ? You live with someone who has hepatitis B. ? You have had sex with someone who has hepatitis B. ? You get hemodialysis treatment. ? You take certain medicines for conditions, including cancer, organ transplantation, and autoimmune conditions.  Hepatitis C  Blood testing is recommended for: ? Everyone born from [birth year] through 1967. ? Anyone with known risk factors for hepatitis C.  Sexually transmitted infections (STIs)  You should be screened for sexually transmitted infections (STIs) including gonorrhea and chlamydia if: ? You are sexually active and are younger than 59 years of age. ? You are older than 59 years of age and your health care provider tells you that you are at risk for this type of infection. ? Your sexual activity has changed since you were last screened and you are at an increased risk for chlamydia or gonorrhea. Ask your health care provider if you are at risk.  If you do not have HIV, but are at risk, it may be recommended that you take a prescription medicine daily to prevent HIV infection. This is called pre-exposure prophylaxis (PrEP). You are considered at risk if: ? You are sexually active and do not regularly use condoms or know the HIV status of your partner(s). ? You take drugs by injection. ? You are  sexually active with a partner who has HIV.  Talk with your health care provider about whether you are at high risk of being infected with HIV. If you choose to begin PrEP, you should first be tested for HIV. You should then be tested every 3 months for as long as you are taking PrEP. Pregnancy  If you are premenopausal and you may become pregnant, ask your health care provider about preconception counseling.  If you may become  pregnant, take 400 to 800 micrograms (mcg) of folic acid every day.  If you want to prevent pregnancy, talk to your health care provider about birth control (contraception). Osteoporosis and menopause  Osteoporosis is a disease in which the bones lose minerals and strength with aging. This can result in serious bone fractures. Your risk for osteoporosis can be identified using a bone density scan.  If you are 90 years of age or older, or if you are at risk for osteoporosis and fractures, ask your health care provider if you should be screened.  Ask your health care provider whether you should take a calcium or vitamin D supplement to lower your risk for osteoporosis.  Menopause may have certain physical symptoms and risks.  Hormone replacement therapy may reduce some of these symptoms and risks. Talk to your health care provider about whether hormone replacement therapy is right for you. Follow these instructions at home:  Schedule regular health, dental, and eye exams.  Stay current with your immunizations.  Do not use any tobacco products including cigarettes, chewing tobacco, or electronic cigarettes.  If you are pregnant, do not drink alcohol.  If you are breastfeeding, limit how much and how often you drink alcohol.  Limit alcohol intake to no more than 1 drink per day for nonpregnant women. One drink equals 12 ounces of beer, 5 ounces of wine, or 1 ounces of hard liquor.  Do not use street drugs.  Do not share needles.  Ask your health care  provider for help if you need support or information about quitting drugs.  Tell your health care provider if you often feel depressed.  Tell your health care provider if you have ever been abused or do not feel safe at home. This information is not intended to replace advice given to you by your health care provider. Make sure you discuss any questions you have with your health care provider. Document Released: 01/29/2011 Document Revised: 12/22/2015 Document Reviewed: 04/19/2015 Elsevier Interactive Patient Education  2018 [employer].   Tinnitus Tinnitus refers to hearing a sound when there is no actual source for that sound. This is often described as ringing in the ears. However, people with this condition may hear a variety of noises. A person may hear the sound in one ear or in both ears. The sounds of tinnitus can be soft, loud, or somewhere in between. Tinnitus can last for a few seconds or can be constant for days. It may go away without treatment and come back at various times. When tinnitus is constant or happens often, it can lead to other problems, such as trouble sleeping and trouble concentrating. Almost everyone experiences tinnitus at some point. Tinnitus that is long-lasting (chronic) or comes back often is a problem that may require medical attention. What are the causes? The cause of tinnitus is often not known. In some cases, it can result from other problems or conditions, including:  Exposure to loud noises from machinery, music, or other sources.  Hearing loss.  Ear or sinus infections.  Earwax buildup.  A foreign object in the ear.  Use of certain medicines.  Use of alcohol and caffeine.  High blood pressure.  Heart diseases.  Anemia.  Allergies.  Meniere disease.  Thyroid problems.  Tumors.  An enlarged part of a weakened blood vessel (aneurysm).  What are the signs or symptoms? The main symptom of tinnitus is hearing a sound when there is  no source for that sound. It may sound  like:  Buzzing.  Roaring.  Ringing.  Blowing air, similar to the sound heard when you listen to a seashell.  Hissing.  Whistling.  Sizzling.  Humming.  Running water.  A sustained musical note.  How is this diagnosed? Tinnitus is diagnosed based on your symptoms. Your health care provider will do a physical exam. A comprehensive hearing exam (audiologic exam) will be done if your tinnitus:  Affects only one ear (unilateral).  Causes hearing difficulties.  Lasts 6 months or longer.  You may also need to see a health care provider who specializes in hearing disorders (audiologist). You may be asked to complete a questionnaire to determine the severity of your tinnitus. Tests may be done to help determine the cause and to rule out other conditions. These can include:  Imaging studies of your head and brain, such as: ? A CT scan. ? An MRI.  An imaging study of your blood vessels (angiogram).  How is this treated? Treating an underlying medical condition can sometimes make tinnitus go away. If your tinnitus continues, other treatments may include:  Medicines, such as certain antidepressants or sleeping aids.  Sound generators to mask the tinnitus. These include: ? Tabletop sound machines that play relaxing sounds to help you fall asleep. ? Wearable devices that fit in your ear and play sounds or music. ? A small device that uses headphones to deliver a signal embedded in music (acoustic neural stimulation). In time, this may change the pathways of your brain and make you less sensitive to tinnitus. This device is used for very severe cases when no other treatment is working.  Therapy and counseling to help you manage the stress of living with tinnitus.  Using hearing aids or cochlear implants, if your tinnitus is related to hearing loss.  Follow these instructions at home:  When possible, avoid being in loud places and being  exposed to loud sounds.  Wear hearing protection, such as earplugs, when you are exposed to loud noises.  Do not take stimulants, such as nicotine, alcohol, or caffeine.  Practice techniques for reducing stress, such as meditation, yoga, or deep breathing.  Use a Shaul noise machine, a humidifier, or other devices to mask the sound of tinnitus.  Sleep with your head slightly raised. This may reduce the impact of tinnitus.  Try to get plenty of rest each night. Contact a health care provider if:  You have tinnitus in just one ear.  Your tinnitus continues for 3 weeks or longer without stopping.  Home care measures are not helping.  You have tinnitus after a head injury.  You have tinnitus along with any of the following: ? Dizziness. ? Loss of balance. ? Nausea and vomiting. This information is not intended to replace advice given to you by your health care provider. Make sure you discuss any questions you have with your health care provider. Document Released: 07/16/2005 Document Revised: 03/18/2016 Document Reviewed: 12/16/2013 Elsevier Interactive Patient Education  2018 [place]    Meniere Disease Meniere disease is an inner ear disorder. It causes attacks of a spinning sensation (vertigo), dizziness, and ringing in the ear (tinnitus). It also causes hearing loss and a feeling of fullness or pressure in the ear. This is a lifelong condition, and it may get worse over time. You may have drop attacks or severe dizziness that makes you fall. A drop attack is when you suddenly fall without losing consciousness and you quickly recover after a few seconds or minutes. What  are the causes? This condition is caused by having too much of the fluid that is in your inner ear (endolymph). When fluid builds up in your inner ear, it affects the nerves that control balance and hearing. The reason for the fluid buildup is not known. Possible causes include:  Allergies.  An abnormal  reaction of the body's defense system (autoimmune disease).  Viral infection of the inner ear.  Head injury.  What increases the risk? You are more likely to develop this condition if:  You are older than age 56.  You have a family history of Meniere disease.  You have a history of autoimmune disease.  You have a history of migraine headaches.  What are the signs or symptoms? Symptoms of this condition can come and go and may last for up to 4 hours at a time. Symptoms usually start in one ear. They may become more frequent and eventually involve both ears. Symptoms can include:  Fullness and pressure in your ear.  Roaring or ringing in your ear.  Vertigo and loss of balance.  Dizziness.  Decreased hearing.  Nausea and vomiting.  How is this diagnosed? This condition is diagnosed based on:  A physical exam.  Tests , such as: ? A hearing test (audiogram). ? An electronystagmogram. This tests your balance nerve (vestibular nerve). ? Imaging studies of your inner ear, such as CT scan or MRI. ? Other balance tests, such as rotational or balance platform tests.  How is this treated? There is no cure for this condition, but treatment can help to manage your symptoms. Treatment may include:  A low-salt diet. Limiting salt may help to reduce fluid in the body and relieve symptoms.  Oral or injected medicines to reduce or control: ? Vertigo. ? Nausea. ? Fluid retention. ? Dizziness.  Use of an air pressure pulse generator. This is a machine that sends small pressure pulses into your ear canal.  Hearing aids.  Inner ear surgery. This is rare.  When you have symptoms, it can be helpful to lie down on a flat surface and focus your eyes on one object that does not move. Try to stay in that position until your symptoms go away. Follow these instructions at home: Eating and drinking  Eat the same amount of food at the same time every day, including snacks.  Do not  skip meals.  Avoid caffeine.  Drink enough fluids to keep your urine clear or pale yellow.  Limit alcoholic drinks to one drink a day for non-pregnant women and 2 drinks a day for men. One drink equals 12 oz of beer, 5 oz of wine, or 1 oz of hard liquor.  Limit the salt (sodium) in your diet as told by your health care provider. Check ingredients and nutrition facts on packaged foods and beverages.  Do not eat foods that contain monosodium glutamate (MSG). General instructions  Do not use any products that contain nicotine or tobacco, such as cigarettes and e-cigarettes. If you need help quitting, ask your health care provider.  Take over-the-counter and prescription medicines only as told by your health care provider.  Find ways to reduce or avoid stress. If you need help with this, ask your health care provider.  Do not drive if you have vertigo or dizziness. Contact a health care provider if:  You have symptoms that last longer than 4 hours.  You have new or worse symptoms. Get help right away if:  You have been vomiting for  24 hours.  You cannot keep fluids down.  You have chest pain or trouble breathing. Summary  Meniere disease is an inner ear disorder. It causes attacks of a spinning sensation (vertigo), dizziness, and ringing in the ear (tinnitus). It also causes hearing loss and a feeling of fullness or pressure in the ear.  Symptoms of this condition can come and go and may last for up to 4 hours at a time.  When you have symptoms, it can be helpful to lie down on a flat surface and focus your eyes on one object that does not move. Try to stay in that position until your symptoms go away. This information is not intended to replace advice given to you by your health care provider. Make sure you discuss any questions you have with your health care provider. Document Released: 07/13/2000 Document Revised: 06/06/2016 Document Reviewed: 06/06/2016 Elsevier Interactive  Patient Education  2017 [employer].

## 2017-10-30 NOTE — Progress Notes (Signed)
Patient ID: [name], female  DOB: 1961/03/11, 57 y.o.   MRN: [id number] Patient Care Team    Relationship Specialty Notifications Start End  Ma Hillock, DO PCP - General Family Medicine  03/29/15     Chief Complaint  Patient presents with  . Annual Exam    Subjective:  [name] is a 57 y.o.  Female  present for CPE. All past medical history, surgical history, allergies, family history, immunizations, medications and social history were updated in the electronic medical record today. All recent labs, ED visits and hospitalizations within the last year were reviewed.  Ringing in her ears: Patient reports bilateral ringing of her ears for the last 4-6 months.  The ringing is worse in the left ear than the right.  She denies NSAID use.  She denies decreased hearing.  Did have one episode of dizziness.  She feels the ringing is worse at night.  Health maintenance:  Colonoscopy: completed March 2011, 10-year follow-up.  Due 2021 Mammogram: completed: July 09, 2017, birads 1.  [facility]. Cervical cancer screening: Hysterectomy.  Followed by Dr. Quincy Simmonds. Immunizations: tdap UTD 2014, Influenza UTD 2018 (encouraged yearly), Shingrix#1 provided today.  Infectious disease screening: HIV and hep C completed today DEXA: last completed 07/09/2017, result -1.1 osteopenic, follow needed followed by Dr. Quincy Simmonds Assistive device: None Oxygen use: None Patient has a Dental home. Hospitalizations/ED visits: Reviewed    Depression screen [facility] 2/9 10/30/2017 05/17/2017  Decreased Interest 0 0  Down, Depressed, Hopeless 0 0  PHQ - 2 Score 0 0   No flowsheet data found.   Current Exercise Habits: Home exercise routine, Type of exercise: walking, Time (Minutes): 30, Frequency (Times/Week): 3, Weekly Exercise (Minutes/Week): 90, Intensity: Mild Exercise limited by: None identified   Immunization History  Administered Date(s) Administered  .  Influenza,inj,Quad PF,6+ Mos 05/17/2017  . Influenza-Unspecified 06/20/2015, 05/25/2016  . PPD Test 03/16/2014  . Tdap 04/03/2013  . Zoster Recombinat (Shingrix) 10/30/2017     Past Medical History:  Diagnosis Date  . Arthritis    neck  . Diverticulosis of colon   . Elevated cholesterol with elevated triglycerides   . History of rectocele   . Insomnia    started ambien 5 ya.  . Osteopenia    --left femur  . Periodic heart flutter   . SVD (spontaneous vaginal delivery)    x 2  . Urinary incontinence    Allergies  Allergen Reactions  . Amoxicillin Hives  . Sulfa Antibiotics Rash    Childhood rxn   Past Surgical History:  Procedure Laterality Date  . BLADDER SUSPENSION N/A 11/10/2013   Procedure: TRANSVAGINAL TAPE (TVT) PROCEDURE;  Surgeon: Jamey Reas de Berton Lan, MD;  Location: [facility];  Service: Gynecology;  Laterality: N/A;  . COLONOSCOPY    . CYSTOSCOPY N/A 11/10/2013   Procedure: CYSTOSCOPY;  Surgeon: Jamey Reas de Berton Lan, MD;  Location: [facility];  Service: Gynecology;  Laterality: N/A;  . IRRIGATION AND DEBRIDEMENT SEBACEOUS CYST  03/2017  . VAGINAL HYSTERECTOMY  11/11    Vag cuff, mild rectocele  . WISDOM TOOTH EXTRACTION     Family History  Problem Relation Age of Onset  . Arthritis Mother   . Hypertension Mother   . Hyperlipidemia Mother   . Heart disease Mother 55       dec  . Hypertension Father   . Hyperlipidemia Father   . Heart disease Father 104  smoked for 10 yrs., no muscle damage, 1 MI   . Pulmonary fibrosis Father        diagnosed end stage, cause unkn.  Marland Kitchen Urolithiasis Father   . Hyperlipidemia Brother   . Urolithiasis Brother   . Diabetes Son   . Asthma Son   . Seizures Son   . Arthritis Maternal Grandmother   . Arthritis Paternal Grandmother   . Asthma Son   . Diverticulosis Sister    Social History   Socioeconomic History  . Marital status: Married    Spouse name: [name]  . Number of children: 2  .  Years of education: Masters  . Highest education level: Not on file  Occupational History  . 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 Financial resource strain: Not on file  . Food insecurity:    Worry: Not on file    Inability: Not on file  . Transportation needs:    Medical: Not on file    Non-medical: Not on file  Tobacco Use  . Smoking status: Never Smoker  . Smokeless tobacco: Never Used  Substance and Sexual Activity  . Alcohol use: Yes    Alcohol/week: 0.0 oz    Comment: ocassional wine-1 glass every 2 weeks  . Drug use: No  . Sexual activity: Yes    Partners: Male    Birth control/protection: Surgical    Comment: hysterectomy--still has ovaries  Lifestyle  . Physical activity:    Days per week: Not on file    Minutes per session: Not on file  . Stress: Not on file  Relationships  . Social connections:    Talks on phone: Not on file    Gets together: Not on file    Attends religious service: Not on file    Active member of club or organization: Not on file    Attends meetings of clubs or organizations: Not on file    Relationship status: Not on file  . Intimate partner violence:    Fear of current or ex partner: Not on file    Emotionally abused: Not on file    Physically abused: Not on file    Forced sexual activity: Not on file  Other Topics Concern  . Not on file  Social History Narrative   Relocated to [facility] from [place]. Due to husband job transfer. 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 She lives with her husband. She has 2 grown sons- oldest recently graduated from college and took a job in [place].; youngest just graduated HS & ia attending [place].      Has had regular preventive care in [place]. At [facility] w/Dr. Alfonse Spruce. Only health concern is insomnia that developed about 5 years ago & has been treated with [place]. She has not  practiced sleep hygiene.      Occasionally drinks sweet tea.    Allergies as of 10/30/2017      Reactions   Amoxicillin Hives   Sulfa Antibiotics Rash   Childhood rxn      Medication List        Accurate as of 10/30/17  5:52 PM. Always use your most recent med list.          baclofen 10 MG tablet Commonly known as:  LIORESAL Take 1 tablet (10 mg total) by mouth 2 (two) times daily.  KRILL OIL PO Take 1 capsule by mouth daily.   pantoprazole 20 MG tablet Commonly known as:  PROTONIX Take 1 tablet (20 mg total) by mouth daily. Needs office visit for further refills.   zolpidem 5 MG tablet Commonly known as:  AMBIEN Take 1 tablet (5 mg total) by mouth at bedtime as needed for sleep.       All past medical history, surgical history, allergies, family history, immunizations andmedications were updated in the EMR today and reviewed under the history and medication portions of their EMR.     Recent Results (from the past 2160 hour(s))  CBC w/Diff     Status: None   Collection Time: 10/30/17  9:55 AM  Result Value Ref Range   WBC 6.3 4.0 - 10.5 K/uL   RBC 4.94 3.87 - 5.11 Mil/uL   Hemoglobin 14.7 12.0 - 15.0 g/dL   HCT 43.8 36.0 - 46.0 %   MCV 88.5 78.0 - 100.0 fl   MCHC 33.6 30.0 - 36.0 g/dL   RDW 15.3 11.5 - 15.5 %   Platelets 310.0 150.0 - 400.0 K/uL   Neutrophils Relative % 59.5 43.0 - 77.0 %   Lymphocytes Relative 30.3 12.0 - 46.0 %   Monocytes Relative 7.8 3.0 - 12.0 %   Eosinophils Relative 1.3 0.0 - 5.0 %   Basophils Relative 1.1 0.0 - 3.0 %   Neutro Abs 3.8 1.4 - 7.7 K/uL   Lymphs Abs 1.9 0.7 - 4.0 K/uL   Monocytes Absolute 0.5 0.1 - 1.0 K/uL   Eosinophils Absolute 0.1 0.0 - 0.7 K/uL   Basophils Absolute 0.1 0.0 - 0.1 K/uL  Comp Met (CMET)     Status: Abnormal   Collection Time: 10/30/17  9:55 AM  Result Value Ref Range   Sodium 141 135 - 145 mEq/L   Potassium 4.2 3.5 - 5.1 mEq/L   Chloride 105 96 - 112 mEq/L   CO2 27 19 - 32 mEq/L   Glucose, Bld 101  (H) 70 - 99 mg/dL   BUN 14 6 - 23 mg/dL   Creatinine, Ser 0.67 0.40 - 1.20 mg/dL   Total Bilirubin 0.5 0.2 - 1.2 mg/dL   Alkaline Phosphatase 90 39 - 117 U/L   AST 12 0 - 37 U/L   ALT 17 0 - 35 U/L   Total Protein 7.0 6.0 - 8.3 g/dL   Albumin 4.3 3.5 - 5.2 g/dL   Calcium 9.7 8.4 - 10.5 mg/dL   GFR 95.94 >60.00 mL/min  Lipid panel     Status: Abnormal   Collection Time: 10/30/17  9:55 AM  Result Value Ref Range   Cholesterol 242 (H) 0 - 200 mg/dL    Comment: ATP III Classification       Desirable:  < 200 mg/dL               Borderline High:  200 - 239 mg/dL          High:  > = 240 mg/dL   Triglycerides 299.0 (H) 0.0 - 149.0 mg/dL    Comment: Normal:  <150 mg/dLBorderline High:  150 - 199 mg/dL   HDL 41.50 >39.00 mg/dL   VLDL 59.8 (H) 0.0 - 40.0 mg/dL   Total CHOL/HDL Ratio 6     Comment:                Men          Women1/2 Average Risk     3.4  3.3Average Risk          5.0          4.42X Average Risk          9.6          7.13X Average Risk          15.0          11.0                       NonHDL 200.84     Comment: NOTE:  Non-HDL goal should be 30 mg/dL higher than patient's LDL goal (i.e. LDL goal of < 70 mg/dL, would have non-HDL goal of < 100 mg/dL)  HgB A1c     Status: None   Collection Time: 10/30/17  9:55 AM  Result Value Ref Range   Hgb A1c MFr Bld 6.1 4.6 - 6.5 %    Comment: Glycemic Control Guidelines for People with Diabetes:Non Diabetic:  <6%Goal of Therapy: <7%Additional Action Suggested:  >8%   Vitamin D (25 hydroxy)     Status: Abnormal   Collection Time: 10/30/17  9:55 AM  Result Value Ref Range   VITD 25.12 (L) 30.00 - 100.00 ng/mL  TSH     Status: None   Collection Time: 10/30/17  9:55 AM  Result Value Ref Range   TSH 1.55 0.35 - 4.50 uIU/mL  LDL cholesterol, direct     Status: None   Collection Time: 10/30/17  9:55 AM  Result Value Ref Range   Direct LDL 154.0 mg/dL    Comment: Optimal:  <100 mg/dLNear or Above Optimal:  100-129 mg/dLBorderline  High:  130-159 mg/dLHigh:  160-189 mg/dLVery High:  >190 mg/dL     ROS: 14 pt review of systems performed and negative (unless mentioned in an HPI)  Objective: BP 128/86 (BP Location: Left Arm, Patient Position: Sitting, Cuff Size: Large)   Pulse 73   Temp 98.4 F (36.9 C)   Resp 20   Ht 5' 4"  (1.626 m)   Wt 172 lb (78 kg)   LMP 07/30/2009 (Within Years)   SpO2 97%   BMI 29.52 kg/m  Gen: Afebrile. No acute distress. Nontoxic in appearance, well-developed, well-nourished, pleasant Caucasian female. HENT: AT. [place]. Bilateral TM visualized and normal in appearance, normal external auditory canal. MMM, no oral lesions, adequate dentition. Bilateral nares within normal limits. Throat without erythema, ulcerations or exudates.  No cough on exam, no hoarseness on exam. Eyes:Pupils Equal Round Reactive to light, Extraocular movements intact,  Conjunctiva without redness, discharge or icterus. Neck/lymp/endocrine: Supple, no lymphadenopathy, no thyromegaly CV: RRR no murmur, no edema, +2/4 P posterior tibialis pulses.  No carotid bruits. No JVD. Chest: CTAB, no wheeze, rhonchi or crackles.  Normal respiratory effort.  Good air movement. Abd: Soft.  Flat. NTND. BS present.  No masses palpated. No hepatosplenomegaly. No rebound tenderness or guarding. Skin: No rashes, purpura or petechiae. Warm and well-perfused. Skin intact. Neuro/Msk:  Normal gait. PERLA. EOMi. Alert. Oriented x3.  Cranial nerves II through XII intact. Muscle strength 5/5 upper/lower extremity. DTRs equal bilaterally. Psych: Normal affect, dress and demeanor. Normal speech. Normal thought content and judgment.   No exam data present  Assessment/plan: ANALYCIA KHOKHAR is a 57 y.o. female present for CPE. Encounter for preventative adult health care exam with abnormal findings Patient was encouraged to exercise greater than 150 minutes a week. Patient was encouraged to choose a diet filled with fresh fruits and vegetables, and  lean meats. AVS provided to patient today for education/recommendation on gender specific health and safety maintenance. Colonoscopy: completed March 2011, 10-year follow-up.  Due 2021 Mammogram: completed: July 09, 2017, birads 1.  [facility]. Cervical cancer screening: Hysterectomy.  Followed by Dr. Quincy Simmonds. Immunizations: tdap UTD 2014, Influenza UTD 2018 (encouraged yearly), Shingrix#1 provided today.  Infectious disease screening: HIV and hep C completed today DEXA: last completed 07/09/2017, result -1.1 osteopenic, follow needed followed by Dr. Quincy Simmonds Encounter for hepatitis C screening test for low risk patient - Hepatitis C Antibody Screening for HIV (human immunodeficiency virus) - HIV antibody (with reflex) Encounter for long-term (current) use of medications - Comp Met (CMET) Lipid screening - Lipid panel Screening for diabetes mellitus - HgB A1c Screening for iron deficiency anemia - CBC w/Diff Insomnia, unspecified type Nonopioid contract signed today.  [place] controlled substance database due today October 30, 2017 and is appropriate. - zolpidem (AMBIEN) 5 MG tablet; Take 1 tablet (5 mg total) by mouth at bedtime as needed for sleep.  Dispense: 90 tablet; Refill: 1 Hypertriglyceridemia - TSH Vitamin D deficiency/osteopenia - Vitamin D (25 hydroxy) -Patient reports she is currently taking 2000 units of vitamin D when she remembers. Tinnitus, bilateral She was advised to avoid NSAIDs.  Watch for signs and symptoms of worsening ringing in ears, hearing loss or signs of Mnire's disease which were reviewed with her today.  She was advised to follow up immediately to have proper assessment of condition if worsening, or does not resolve. Immunization due #1 given today, repeat vaccination in 2-6 months for completion of series. - Varicella-zoster vaccine IM    Return in about 1 year (around 10/31/2018) for CPE.  Electronically signed  by: Howard Pouch, DO [place]

## 2017-10-31 LAB — HEPATITIS C ANTIBODY
Hepatitis C Ab: NONREACTIVE
SIGNAL TO CUT-OFF: 0.02 (ref <1.00)

## 2017-10-31 LAB — HIV ANTIBODY (ROUTINE TESTING W REFLEX): HIV 1&2 Ab, 4th Generation: NONREACTIVE

## 2017-11-01 ENCOUNTER — Telehealth: Payer: Self-pay | Admitting: Family Medicine

## 2017-11-01 MED ORDER — VITAMIN D (ERGOCALCIFEROL) 1.25 MG (50000 UNIT) PO CAPS: 50000.0000 [IU] | ORAL_CAPSULE | ORAL | 0 refills | Status: DC

## 2017-11-01 MED ORDER — PRAVASTATIN SODIUM 20 MG PO TABS: 20.0000 mg | ORAL_TABLET | Freq: Every day | ORAL | 3 refills | Status: DC

## 2017-11-01 NOTE — Telephone Encounter (Signed)
Spoke with patient reviewed Lab results ,instructions and information. Patient verbalized understanding. Detailed PCP Informations and Instruction was also sent to Pt via [facility] message.

## 2017-11-01 NOTE — Telephone Encounter (Signed)
Please inform patient the following information: - her blood work was normal with the following exceptions-  - her a1c (diabestes screen) is elevated. Her last test was 5.3--> 6.1. This is still in the "prediabetes" range. However any additional increase and she would be a diabetic. Increase exercise to > 150 minutes a week, cut back on sugar and carbohydrates.  - Her cholesterol is also high. Higher than last year. I would like her to consider starting pravastatin to help lower cholesterol and giver her CV protection. She is at an increased CV risk with her cholesterol levels. I have called this in for her. Stop Krill oil if still taking.  - her vit d is low again at 21. I have called in the prescribed supplement and she should get back in the habit of taking her OTC every day.   - F/U 3 months with provider appt. She should be fasting for repeat fasting lipids, a1c and lft.

## 2017-11-18 ENCOUNTER — Encounter: Payer: Self-pay | Admitting: Family Medicine

## 2017-11-19 ENCOUNTER — Other Ambulatory Visit: Payer: Self-pay | Admitting: *Deleted

## 2017-11-19 DIAGNOSIS — K219 Gastro-esophageal reflux disease without esophagitis: Secondary | ICD-10-CM

## 2017-11-19 MED ORDER — PANTOPRAZOLE SODIUM 20 MG PO TBEC: 20.0000 mg | DELAYED_RELEASE_TABLET | Freq: Every day | ORAL | 3 refills | Status: DC

## 2017-11-20 ENCOUNTER — Telehealth: Payer: Self-pay | Admitting: *Deleted

## 2017-11-20 MED ORDER — BACLOFEN 10 MG PO TABS: 10.0000 mg | ORAL_TABLET | Freq: Two times a day (BID) | ORAL | 1 refills | Status: DC

## 2017-11-20 NOTE — Telephone Encounter (Signed)
refilled 

## 2017-11-20 NOTE — Addendum Note (Signed)
Addended by: Howard Pouch A on: 11/20/2017 11:05 AM   Modules accepted: Orders

## 2017-11-20 NOTE — Telephone Encounter (Signed)
Received request for refill on Baclofen. This was originally Rx'd  On 11/29/16 for neck pain. Last refilled on 07/18/17 60 with 1 refill. She was seen recently for CPE 10/30/17. Do you want this refilled. Please advise.

## 2017-11-26 ENCOUNTER — Encounter: Payer: Self-pay | Admitting: Family Medicine

## 2018-01-05 ENCOUNTER — Encounter: Payer: Self-pay | Admitting: Family Medicine

## 2018-01-21 ENCOUNTER — Telehealth: Payer: Self-pay | Admitting: Family Medicine

## 2018-01-21 NOTE — Telephone Encounter (Signed)
Copied from [place] [id number]. Topic: Quick Communication - Rx Refill/Question >> Jan 21, 2018 11:18 AM Boyd Kerbs wrote: Medication:  baclofen (LIORESAL) 10 MG tablet  Has the patient contacted their pharmacy? Yes.   (Agent: If no, request that the patient contact the pharmacy for the refill.) (Agent: If yes, when and what did the pharmacy advise?)  Preferred Pharmacy (with phone number or street name):   [id number] - [place], [place] [address] [place] [address] Phone: [phone] Fax: [phone]    Agent: Please be advised that RX refills may take up to 3 business days. We ask that you follow-up with your pharmacy.

## 2018-01-22 ENCOUNTER — Other Ambulatory Visit: Payer: Self-pay | Admitting: *Deleted

## 2018-01-22 ENCOUNTER — Encounter: Payer: Self-pay | Admitting: *Deleted

## 2018-01-22 MED ORDER — BACLOFEN 10 MG PO TABS: 10.0000 mg | ORAL_TABLET | Freq: Two times a day (BID) | ORAL | 0 refills | Status: DC

## 2018-01-22 NOTE — Telephone Encounter (Signed)
Refill of Baclofen  LRF 11/20/17  #60  1 refill   LOV 10/30/17  Dr. Raoul Pitch   [facility] [place], [place]

## 2018-01-23 NOTE — Telephone Encounter (Signed)
14 day supply sent yesterday patient needs office visit prior to any additional refills. Sent patient information in MY Chart.

## 2018-01-27 ENCOUNTER — Ambulatory Visit (INDEPENDENT_AMBULATORY_CARE_PROVIDER_SITE_OTHER): Payer: 59

## 2018-01-27 DIAGNOSIS — Z23 Encounter for immunization: Secondary | ICD-10-CM

## 2018-01-27 NOTE — Progress Notes (Signed)
[name] is a 57 y.o. female presents to the office today for #2 Shingrix  injections, per physician's orders. Original order: 10/30/17, shingrix, 0.73ml, IM was administered Left deltoid today. Patient tolerated injection. Patient due for follow up labs/provider appt: No. Date due:, appt made No Patient next injection due:  appt made No  Gordy Councilman

## 2018-04-23 DIAGNOSIS — H2513 Age-related nuclear cataract, bilateral: Secondary | ICD-10-CM | POA: Diagnosis not present

## 2018-05-22 ENCOUNTER — Ambulatory Visit (INDEPENDENT_AMBULATORY_CARE_PROVIDER_SITE_OTHER): Payer: BLUE CROSS/BLUE SHIELD | Admitting: Family Medicine

## 2018-05-22 ENCOUNTER — Encounter: Payer: Self-pay | Admitting: Family Medicine

## 2018-05-22 VITALS — BP 129/83 | HR 74 | Temp 98.3°F | Resp 20 | Ht 64.0 in | Wt 177.2 lb

## 2018-05-22 DIAGNOSIS — G4700 Insomnia, unspecified: Secondary | ICD-10-CM

## 2018-05-22 DIAGNOSIS — R7303 Prediabetes: Secondary | ICD-10-CM

## 2018-05-22 DIAGNOSIS — E782 Mixed hyperlipidemia: Secondary | ICD-10-CM | POA: Diagnosis not present

## 2018-05-22 DIAGNOSIS — H6992 Unspecified Eustachian tube disorder, left ear: Secondary | ICD-10-CM

## 2018-05-22 DIAGNOSIS — H6982 Other specified disorders of Eustachian tube, left ear: Secondary | ICD-10-CM

## 2018-05-22 DIAGNOSIS — Z8249 Family history of ischemic heart disease and other diseases of the circulatory system: Secondary | ICD-10-CM

## 2018-05-22 DIAGNOSIS — E559 Vitamin D deficiency, unspecified: Secondary | ICD-10-CM | POA: Diagnosis not present

## 2018-05-22 DIAGNOSIS — Z79899 Other long term (current) drug therapy: Secondary | ICD-10-CM | POA: Diagnosis not present

## 2018-05-22 HISTORY — DX: Unspecified Eustachian tube disorder, left ear: H69.92

## 2018-05-22 HISTORY — DX: Other specified disorders of eustachian tube, left ear: H69.82

## 2018-05-22 LAB — HEPATIC FUNCTION PANEL
ALT: 22 U/L (ref 0–35)
AST: 15 U/L (ref 0–37)
Albumin: 4.4 g/dL (ref 3.5–5.2)
Alkaline Phosphatase: 62 U/L (ref 39–117)
BILIRUBIN DIRECT: 0.1 mg/dL (ref 0.0–0.3)
TOTAL PROTEIN: 6.8 g/dL (ref 6.0–8.3)
Total Bilirubin: 0.5 mg/dL (ref 0.2–1.2)

## 2018-05-22 LAB — LIPID PANEL
CHOL/HDL RATIO: 4
Cholesterol: 175 mg/dL (ref 0–200)
HDL: 43.7 mg/dL (ref >39.00)
NonHDL: 131.55
TRIGLYCERIDES: 277 mg/dL — AB (ref 0.0–149.0)
VLDL: 55.4 mg/dL — ABNORMAL HIGH (ref 0.0–40.0)

## 2018-05-22 LAB — HEMOGLOBIN A1C: Hgb A1c MFr Bld: 5.8 % (ref 4.6–6.5)

## 2018-05-22 LAB — LDL CHOLESTEROL, DIRECT: LDL DIRECT: 101 mg/dL

## 2018-05-22 LAB — VITAMIN D 25 HYDROXY (VIT D DEFICIENCY, FRACTURES): VITD: 33.67 ng/mL (ref 30.00–100.00)

## 2018-05-22 MED ORDER — ZOLPIDEM TARTRATE 5 MG PO TABS: 5.0000 mg | ORAL_TABLET | Freq: Every evening | ORAL | 1 refills | Status: DC | PRN

## 2018-05-22 NOTE — Patient Instructions (Addendum)
Please help [place] help you:  We are honored you have chosen [facility] for your Primary Care home. Below you will find basic instructions that you may need to access in the future. Please help [place] help you by reading the instructions, which cover many of the frequent questions we experience.   Prescription refills and request:  -In order to allow more efficient response time, please call your pharmacy for all refills. They will forward the request electronically to [place]. This allows for the quickest possible response. Request left on a nurse line can take longer to refill, since these are checked as time allows between office patients and other phone calls.  - refill request can take up to 3-5 working days to complete.  - If request is sent electronically and request is appropiate, it is usually completed in 1-2 business days.  - all patients will need to be seen routinely for all chronic medical conditions requiring prescription medications (see follow-up below). If you are overdue for follow up on your condition, you will be asked to make an appointment and we will call in enough medication to cover you until your appointment (up to 30 days).  - all controlled substances will require a face to face visit to request/refill.  - if you desire your prescriptions to go through a new pharmacy, and have an active script at original pharmacy, you will need to call your pharmacy and have scripts transferred to new pharmacy. This is completed between the pharmacy locations and not by your provider.    Results: If any images or labs were ordered, it can take up to 1 week to get results depending on the test ordered and the lab/facility running and resulting the test. - Normal or stable results, which do not need further discussion, may be released to your mychart immediately with attached note to you. A call may not be generated for normal results. Please make certain to sign up for mychart. If you have  questions on how to activate your mychart you can call the front office.  - If your results need further discussion, our office will attempt to contact you via phone, and if unable to reach you after 2 attempts, we will release your abnormal result to your mychart with instructions.  - All results will be automatically released in mychart after 1 week.  - Your provider will provide you with explanation and instruction on all relevant material in your results. Please keep in mind, results and labs may appear confusing or abnormal to the untrained eye, but it does not mean they are actually abnormal for you personally. If you have any questions about your results that are not covered, or you desire more detailed explanation than what was provided, you should make an appointment with your provider to do so.   Our office handles many outgoing and incoming calls daily. If we have not contacted you within 1 week about your results, please check your mychart to see if there is a message first and if not, then contact our office.  In helping with this matter, you help decrease call volume, and therefore allow [place] to be able to respond to patients needs more efficiently.   Acute office visits (sick visit):  An acute visit is intended for a new problem and are scheduled in shorter time slots to allow schedule openings for patients with new problems. This is the appropriate visit to discuss a new problem. Problems will not be addressed  by phone call or [employer]. Appointment is needed if requesting treatment. In order to provide you with excellent quality medical care with proper time for you to explain your problem, have an exam and receive treatment with instructions, these appointments should be limited to one new problem per visit. If you experience a new problem, in which you desire to be addressed, please make an acute office visit, we save openings on the schedule to accommodate you. Please do not save your  new problem for any other type of visit, let us take care of it properly and quickly for you.   Follow up visits:  Depending on your condition(s) your provider will need to see you routinely in order to provide you with quality care and prescribe medication(s). Most chronic conditions (Example: hypertension, Diabetes, depression/anxiety... etc), require visits a couple times a year. Your provider will instruct you on proper follow up for your personal medical conditions and history. Please make certain to make follow up appointments for your condition as instructed. Failing to do so could result in lapse in your medication treatment/refills. If you request a refill, and are overdue to be seen on a condition, we will always provide you with a 30 day script (once) to allow you time to schedule.    Medicare wellness (well visit): - we have a wonderful Nurse Maudie Mercury), that will meet with you and provide you will yearly medicare wellness visits. These visits should occur yearly (can not be scheduled less than 1 calendar year apart) and cover preventive health, immunizations, advance directives and screenings you are entitled to yearly through your medicare benefits. Do not miss out on your entitled benefits, this is when medicare will pay for these benefits to be ordered for you.  These are strongly encouraged by your provider and is the appropriate type of visit to make certain you are up to date with all preventive health benefits. If you have not had your medicare wellness exam in the last 12 months, please make certain to schedule one by calling the office and schedule your medicare wellness with Maudie Mercury as soon as possible.   Yearly physical (well visit):  - Adults are recommended to be seen yearly for physicals. Check with your insurance and date of your last physical, most insurances require one calendar year between physicals. Physicals include all preventive health topics, screenings, medical exam and labs  that are appropriate for gender/age and history. You may have fasting labs needed at this visit. This is a well visit (not a sick visit), new problems should not be covered during this visit (see acute visit).  - Pediatric patients are seen more frequently when they are younger. Your provider will advise you on well child visit timing that is appropriate for your their age. - This is not a medicare wellness visit. Medicare wellness exams do not have an exam portion to the visit. Some medicare companies allow for a physical, some do not allow a yearly physical. If your medicare allows a yearly physical you can schedule the medicare wellness with our nurse Maudie Mercury and have your physical with your provider after, on the same day. Please check with insurance for your full benefits.   Late Policy/No Shows:  - all new patients should arrive 15-30 minutes earlier than appointment to allow [place] time  to  obtain all personal demographics,  insurance information and for you to complete office paperwork. - All established patients should arrive 10-15 minutes earlier than appointment time to update  all information and be checked in .  - In our best efforts to run on time, if you are late for your appointment you will be asked to either reschedule or if able, we will work you back into the schedule. There will be a wait time to work you back in the schedule,  depending on availability.  - If you are unable to make it to your appointment as scheduled, please call 24 hours ahead of time to allow [place] to fill the time slot with someone else who needs to be seen. If you do not cancel your appointment ahead of time, you may be charged a no show fee. Food Choices to Lower Your Triglycerides Triglycerides are a type of fat in your blood. High levels of triglycerides can increase the risk of heart disease and stroke. If your triglyceride levels are high, the foods you eat and your eating habits are very important. Choosing the right  foods can help lower your triglycerides. What general guidelines do I need to follow?  Lose weight if you are overweight.  Limit or avoid alcohol.  Fill one half of your plate with vegetables and green salads.  Limit fruit to two servings a day. Choose fruit instead of juice.  Make one fourth of your plate whole grains. Look for the word "whole" as the first word in the ingredient list.  Fill one fourth of your plate with lean protein foods.  Enjoy fatty fish (such as salmon, mackerel, sardines, and tuna) three times a week.  Choose healthy fats.  Limit foods high in starch and sugar.  Eat more home-cooked food and less restaurant, buffet, and fast food.  Limit fried foods.  Cook foods using methods other than frying.  Limit saturated fats.  Check ingredient lists to avoid foods with partially hydrogenated oils (trans fats) in them. What foods can I eat? Grains Whole grains, such as whole wheat or whole grain breads, crackers, cereals, and pasta. Unsweetened oatmeal, bulgur, barley, quinoa, or brown rice. Corn or whole wheat flour tortillas. Vegetables Fresh or frozen vegetables (raw, steamed, roasted, or grilled). Green salads. Fruits All fresh, canned (in natural juice), or frozen fruits. Meat and Other Protein Products Ground beef (85% or leaner), grass-fed beef, or beef trimmed of fat. Skinless chicken or [place]. Ground chicken or [place]. Pork trimmed of fat. All fish and seafood. Eggs. Dried beans, peas, or lentils. Unsalted nuts or seeds. Unsalted canned or dry beans. Dairy Low-fat dairy products, such as skim or 1% milk, 2% or reduced-fat cheeses, low-fat ricotta or cottage cheese, or plain low-fat yogurt. Fats and Oils Tub margarines without trans fats. Light or reduced-fat mayonnaise and salad dressings. Avocado. Safflower, olive, or canola oils. Natural peanut or almond butter. The items listed above may not be a complete list of recommended foods or beverages.  Contact your dietitian for more options. What foods are not recommended? Grains Forero bread. Hedtke pasta. Stuckey rice. Cornbread. Bagels, pastries, and croissants. Crackers that contain trans fat. Vegetables Pupo potatoes. Corn. Creamed or fried vegetables. Vegetables in a cheese sauce. Fruits Dried fruits. Canned fruit in light or heavy syrup. Fruit juice. Meat and Other Protein Products Fatty cuts of meat. Ribs, chicken wings, bacon, sausage, bologna, salami, chitterlings, fatback, hot dogs, bratwurst, and packaged luncheon meats. Dairy Whole or 2% milk, cream, half-and-half, and cream cheese. Whole-fat or sweetened yogurt. Full-fat cheeses. Nondairy creamers and whipped toppings. Processed cheese, cheese spreads, or cheese curds. Sweets and Desserts Corn syrup, sugars, honey, and  molasses. Candy. Jam and jelly. Syrup. Sweetened cereals. Cookies, pies, cakes, donuts, muffins, and ice cream. Fats and Oils Butter, stick margarine, lard, shortening, ghee, or bacon fat. Coconut, palm kernel, or palm oils. Beverages Alcohol. Sweetened drinks (such as sodas, lemonade, and fruit drinks or punches). The items listed above may not be a complete list of foods and beverages to avoid. Contact your dietitian for more information. This information is not intended to replace advice given to you by your health care provider. Make sure you discuss any questions you have with your health care provider. Document Released: 05/03/2004 Document Revised: 12/22/2015 Document Reviewed: 05/20/2013 Elsevier Interactive Patient Education  2017 [employer].

## 2018-05-22 NOTE — Progress Notes (Signed)
[name] , 26-May-1961, 57 y.o., female MRN: [id number] Patient Care Team    Relationship Specialty Notifications Start End  Ma Hillock, DO PCP - General Family Medicine  03/29/15     Chief Complaint  Patient presents with  . Insomnia     Subjective:  Insomnia:  Patient reports insomnia is well controlled on Ambien 5 mg daily at bedtime. She has been on this medication since ~2012. She routinely  takes his medication,sometimes only a half tab. She denies negative side effects. She needed refills on his medication today.   Mixed hyperlipidemia/ On statin therapy/Family history of premature coronary artery disease Elevated triglycerides/lipids last visit in April. Started statin and is tolerating.   Prediabetes HgB A1c 6 months ago 6.1. She has tried exercise and diet modifications, but is unable to lose weight.   Eustachian tube dysfunction, left  left ear crackling sound for 3-4 weeks. Denies pain. Feels like there is water in it.   Vitamin D deficiency Taking daily supplement. Was prescribed Vit D 50000, did not follow up secondary to insurance issues.   Depression screen [facility] 2/9 05/22/2018 10/30/2017 05/17/2017  Decreased Interest 0 0 0  Down, Depressed, Hopeless 0 0 0  PHQ - 2 Score 0 0 0    Allergies  Allergen Reactions  . Amoxicillin Hives  . Sulfa Antibiotics Rash    Childhood rxn   Social History   Tobacco Use  . Smoking status: Never Smoker  . Smokeless tobacco: Never Used  Substance Use Topics  . Alcohol use: Yes    Alcohol/week: 0.0 standard drinks    Comment: ocassional wine-1 glass every 2 weeks   Past Medical History:  Diagnosis Date  . Arthritis    neck  . Diverticulosis of colon   . Elevated cholesterol with elevated triglycerides   . History of rectocele   . Insomnia    started ambien 5 ya.  . Osteopenia    --left femur  . Periodic heart flutter   . SVD (spontaneous vaginal delivery)    x 2  . Urinary incontinence    Past  Surgical History:  Procedure Laterality Date  . BLADDER SUSPENSION N/A 11/10/2013   Procedure: TRANSVAGINAL TAPE (TVT) PROCEDURE;  Surgeon: Jamey Reas de Berton Lan, MD;  Location: [facility];  Service: Gynecology;  Laterality: N/A;  . COLONOSCOPY    . CYSTOSCOPY N/A 11/10/2013   Procedure: CYSTOSCOPY;  Surgeon: Jamey Reas de Berton Lan, MD;  Location: [facility];  Service: Gynecology;  Laterality: N/A;  . IRRIGATION AND DEBRIDEMENT SEBACEOUS CYST  03/2017  . VAGINAL HYSTERECTOMY  11/11    Vag cuff, mild rectocele  . WISDOM TOOTH EXTRACTION     Family History  Problem Relation Age of Onset  . Arthritis Mother   . Hypertension Mother   . Hyperlipidemia Mother   . Heart disease Mother 23       dec  . Hypertension Father   . Hyperlipidemia Father   . Heart disease Father 14       smoked for 10 yrs., no muscle damage, 1 MI   . Pulmonary fibrosis Father        diagnosed end stage, cause unkn.  [place] Kitchen Urolithiasis Father   . Hyperlipidemia Brother   . Urolithiasis Brother   . Diabetes Son   . Asthma Son   . Seizures Son   . Arthritis Maternal Grandmother   . Arthritis Paternal Grandmother   . Asthma Son   .  Diverticulosis Sister    Allergies as of 05/22/2018      Reactions   Amoxicillin Hives   Sulfa Antibiotics Rash   Childhood rxn      Medication List        Accurate as of 05/22/18  9:17 AM. Always use your most recent med list.          baclofen 10 MG tablet Commonly known as:  LIORESAL Take 1 tablet (10 mg total) by mouth 2 (two) times daily.   pantoprazole 20 MG tablet Commonly known as:  PROTONIX Take 1 tablet (20 mg total) by mouth daily. Needs office visit for further refills.   pravastatin 20 MG tablet Commonly known as:  PRAVACHOL Take 1 tablet (20 mg total) by mouth daily.   VITAMIN D (CHOLECALCIFEROL) PO Take by mouth.   zolpidem 5 MG tablet Commonly known as:  AMBIEN Take 1 tablet (5 mg total) by mouth at bedtime as needed for  sleep.       All past medical history, surgical history, allergies, family history, immunizations andmedications were updated in the EMR today and reviewed under the history and medication portions of their EMR.     ROS: Negative, with the exception of above mentioned in HPI   Objective:  BP 129/83 (BP Location: Right Arm, Patient Position: Sitting, Cuff Size: Normal)   Pulse 74   Temp 98.3 F (36.8 C)   Resp 20   Ht 5\' 4"  (1.626 m)   Wt 177 lb 4 oz (80.4 kg)   LMP 07/30/2009 (Within Years)   SpO2 97%   BMI 30.42 kg/m  Body mass index is 30.42 kg/m. Gen: Afebrile. No acute distress. Nontoxic in presentation. Very pleasant caucasian female.  HENT: AT. [place].  MMM. Bilateral TM appreciated and without erythema or fullness.  Eyes:Pupils Equal Round Reactive to light, Extraocular movements intact,  Conjunctiva without redness, discharge or icterus. Neck/lymp/endocrine: Supple,no lymphadenopathy, no thyromegaly CV: RRR no murmur, no edema, +2/4 P posterior tibialis pulses Chest: CTAB, no wheeze or crackles Abd: Soft. NTND. BS present. no Masses palpated.  Skin: no rashes, purpura or petechiae.  Neuro:  Normal gait. PERLA. EOMi. Alert. Oriented x3 . Psych: Normal affect, dress and demeanor. Normal speech. Normal thought content and judgment.   No exam data present No results found. No results found for this or any previous visit (from the past 24 hour(s)).  Assessment/Plan: [name] is a 57 y.o. female present for OV for  Insomnia, unspecified type - stable.  - Refills on ambien 5 mg QHS today.  - [facility] database reviewed 05/22/18 and part of permanent record.  - Contract signed.  - F/U 6 month  or after when needing refills.   Mixed hyperlipidemia/ On statin therapy/Family history of premature coronary artery disease - tolerating pravastatin.  - Hepatic function panel - Lipid panel  Prediabetes - last A1c 6.1 - HgB A1c  Eustachian tube dysfunction,  left Reassured. Mild air fluid bubbles behind TM. recommend allergy med/flonase use.  Vitamin D deficiency Recheck levels after supplement prescribed - Vitamin D (25 hydroxy)   Reviewed expectations re: course of current medical issues.  Discussed self-management of symptoms.  Outlined signs and symptoms indicating need for more acute intervention.  Patient verbalized understanding and all questions were answered.  Patient received an After-Visit Summary.    Orders Placed This Encounter  Procedures  . HgB A1c  . Hepatic function panel  . Lipid panel  . Vitamin D (25 hydroxy)    >  25 minutes spent with patient, >50% of time spent face to face    Note is dictated utilizing voice recognition software. Although note has been proof read prior to signing, occasional typographical errors still can be missed. If any questions arise, please do not hesitate to call for verification.   electronically signed by:  Howard Pouch, DO  [place]

## 2018-05-23 ENCOUNTER — Telehealth: Payer: Self-pay | Admitting: Family Medicine

## 2018-05-23 ENCOUNTER — Encounter: Payer: Self-pay | Admitting: *Deleted

## 2018-05-23 ENCOUNTER — Encounter: Payer: Self-pay | Admitting: Family Medicine

## 2018-05-23 NOTE — Telephone Encounter (Signed)
Spoke with patient reviewed lab results and instructions. Patient verbalized understanding.Also sent in My Chart at patients request.

## 2018-05-23 NOTE — Telephone Encounter (Signed)
Her cholesterol panel has greatly improved.  Total 242--> 175 LDL/Bad 154--> 101 Her triglycerides decreased by 20 points but still elevated.  - recs: keep prescribed med the same and add Omega 3 (plain fish oil) 2000 mg a day (OTC). The fish oil targets the triglycerides. Diet low saturated fats and high in fiber is also helpful.  We will followup on these when she has her cpe.

## 2018-05-26 ENCOUNTER — Other Ambulatory Visit: Payer: Self-pay | Admitting: Family Medicine

## 2018-05-26 DIAGNOSIS — Z1231 Encounter for screening mammogram for malignant neoplasm of breast: Secondary | ICD-10-CM

## 2018-07-02 ENCOUNTER — Other Ambulatory Visit: Payer: Self-pay

## 2018-07-02 MED ORDER — BACLOFEN 10 MG PO TABS: 10.0000 mg | ORAL_TABLET | Freq: Two times a day (BID) | ORAL | 5 refills | Status: DC

## 2018-07-14 ENCOUNTER — Encounter: Payer: Self-pay | Admitting: Family Medicine

## 2018-07-14 ENCOUNTER — Ambulatory Visit (HOSPITAL_BASED_OUTPATIENT_CLINIC_OR_DEPARTMENT_OTHER)
Admission: RE | Admit: 2018-07-14 | Discharge: 2018-07-14 | Disposition: A | Discharge status: Home / Self Care | Payer: BLUE CROSS/BLUE SHIELD | Source: Ambulatory Visit | Attending: Family Medicine | Admitting: Family Medicine

## 2018-07-14 DIAGNOSIS — Z1231 Encounter for screening mammogram for malignant neoplasm of breast: Secondary | ICD-10-CM

## 2018-07-14 DIAGNOSIS — R928 Other abnormal and inconclusive findings on diagnostic imaging of breast: Secondary | ICD-10-CM | POA: Insufficient documentation

## 2018-07-15 ENCOUNTER — Other Ambulatory Visit: Payer: Self-pay | Admitting: Family Medicine

## 2018-07-15 DIAGNOSIS — R928 Other abnormal and inconclusive findings on diagnostic imaging of breast: Secondary | ICD-10-CM

## 2018-07-21 ENCOUNTER — Ambulatory Visit
Admission: RE | Admit: 2018-07-21 | Discharge: 2018-07-21 | Disposition: A | Discharge status: Home / Self Care | Payer: BLUE CROSS/BLUE SHIELD | Source: Ambulatory Visit | Attending: Family Medicine | Admitting: Family Medicine

## 2018-07-21 DIAGNOSIS — R928 Other abnormal and inconclusive findings on diagnostic imaging of breast: Secondary | ICD-10-CM

## 2018-07-21 DIAGNOSIS — R922 Inconclusive mammogram: Secondary | ICD-10-CM | POA: Diagnosis not present

## 2018-07-21 DIAGNOSIS — N6011 Diffuse cystic mastopathy of right breast: Secondary | ICD-10-CM | POA: Diagnosis not present

## 2018-10-16 ENCOUNTER — Other Ambulatory Visit: Payer: Self-pay | Admitting: Family Medicine

## 2018-11-13 ENCOUNTER — Other Ambulatory Visit: Payer: Self-pay | Admitting: Family Medicine

## 2018-11-13 DIAGNOSIS — K219 Gastro-esophageal reflux disease without esophagitis: Secondary | ICD-10-CM

## 2018-11-17 ENCOUNTER — Ambulatory Visit: Payer: PRIVATE HEALTH INSURANCE | Admitting: Obstetrics and Gynecology

## 2018-11-21 ENCOUNTER — Other Ambulatory Visit: Payer: Self-pay

## 2018-11-21 ENCOUNTER — Encounter: Payer: Self-pay | Admitting: Family Medicine

## 2018-11-21 ENCOUNTER — Ambulatory Visit (INDEPENDENT_AMBULATORY_CARE_PROVIDER_SITE_OTHER): Payer: BLUE CROSS/BLUE SHIELD | Admitting: Family Medicine

## 2018-11-21 VITALS — Ht 64.0 in | Wt 168.2 lb

## 2018-11-21 DIAGNOSIS — E782 Mixed hyperlipidemia: Secondary | ICD-10-CM | POA: Diagnosis not present

## 2018-11-21 DIAGNOSIS — G4700 Insomnia, unspecified: Secondary | ICD-10-CM | POA: Diagnosis not present

## 2018-11-21 DIAGNOSIS — K219 Gastro-esophageal reflux disease without esophagitis: Secondary | ICD-10-CM

## 2018-11-21 DIAGNOSIS — M8580 Other specified disorders of bone density and structure, unspecified site: Secondary | ICD-10-CM

## 2018-11-21 DIAGNOSIS — R7303 Prediabetes: Secondary | ICD-10-CM

## 2018-11-21 DIAGNOSIS — Z79899 Other long term (current) drug therapy: Secondary | ICD-10-CM

## 2018-11-21 DIAGNOSIS — Z8249 Family history of ischemic heart disease and other diseases of the circulatory system: Secondary | ICD-10-CM

## 2018-11-21 DIAGNOSIS — E559 Vitamin D deficiency, unspecified: Secondary | ICD-10-CM

## 2018-11-21 MED ORDER — PRAVASTATIN SODIUM 20 MG PO TABS: 20.0000 mg | ORAL_TABLET | Freq: Every day | ORAL | 3 refills | Status: DC

## 2018-11-21 MED ORDER — BACLOFEN 10 MG PO TABS: 10.0000 mg | ORAL_TABLET | Freq: Two times a day (BID) | ORAL | 1 refills | Status: DC

## 2018-11-21 MED ORDER — ZOLPIDEM TARTRATE 5 MG PO TABS: 5.0000 mg | ORAL_TABLET | Freq: Every evening | ORAL | 1 refills | Status: DC | PRN

## 2018-11-21 MED ORDER — PANTOPRAZOLE SODIUM 20 MG PO TBEC: 20.0000 mg | DELAYED_RELEASE_TABLET | Freq: Every day | ORAL | 3 refills | Status: DC

## 2018-11-21 NOTE — Progress Notes (Signed)
VIRTUAL VISIT VIA VIDEO  I connected with [name] on 11/21/18 at  8:40 AM EDT by a video enabled telemedicine application and verified that I am speaking with the correct person using two identifiers. Location patient: Home Location provider: [facility], Office Persons participating in the virtual visit: Patient, Dr. Raoul Pitch and R.Baker, LPN  I discussed the limitations of evaluation and management by telemedicine and the availability of in person appointments. The patient expressed understanding and agreed to proceed.   SUBJECTIVE Chief Complaint  Patient presents with  . Insomnia    Pt is doing well with no complaints. Need refills on Baclofen and Ambien     HPI:  Insomnia: Patient reports insomnia is well controlled on Ambien 5 mg daily at bedtime. She has been on this medication since ~2012. She routinely  takes his medication,sometimes only a half tab. She denies negative side effects. She needed refills on his medication today.   Mixed hyperlipidemia/ On statin therapy/Family history of premature coronary artery disease Tolerating statin and fish oil 1000 mg QD.    Prediabetes HgB A1c  6.1>> 5.8 last check. Exercising daily.  05/22/2018--> total cholesterol 175, HDL 3, LDL 101, triglycerides 277 (then added fish oil).   Vitamin D deficiency Taking daily supplement- last Vit D 05/22/2018--> 33 ROS: See pertinent positives and negatives per HPI.  Patient Active Problem List   Diagnosis Date Noted  . Prediabetes 05/22/2018  . Mixed hyperlipidemia 05/22/2018  . On statin therapy 05/22/2018  . GERD (gastroesophageal reflux disease) 01/02/2016  . Osteopenia 08/11/2015  . Ulnar nerve compression 03/29/2015  . Vitamin D deficiency 11/23/2014  . Insomnia 01/19/2013  . Family history of premature coronary artery disease 01/19/2013    Social History   Tobacco Use  . Smoking status: Never Smoker  . Smokeless tobacco: Never Used  Substance Use Topics   . Alcohol use: Yes    Alcohol/week: 0.0 standard drinks    Comment: ocassional wine-1 glass every 2 weeks    Current Outpatient Medications:  .  baclofen (LIORESAL) 10 MG tablet, Take 1 tablet (10 mg total) by mouth 2 (two) times daily., Disp: 90 each, Rfl: 1 .  calcium acetate (PHOSLO) 667 MG capsule, Take 600 mg by mouth daily., Disp: , Rfl:  .  Omega-3 Fatty Acids (FISH OIL) 1000 MG CAPS, Take 1 capsule by mouth., Disp: , Rfl:  .  pantoprazole (PROTONIX) 20 MG tablet, Take 1 tablet (20 mg total) by mouth daily. Needs office visit for further refills., Disp: 90 tablet, Rfl: 3 .  pravastatin (PRAVACHOL) 20 MG tablet, Take 1 tablet (20 mg total) by mouth daily., Disp: 90 tablet, Rfl: 3 .  VITAMIN D, CHOLECALCIFEROL, PO, Take 1 tablet by mouth. 300U daily, Disp: , Rfl:  .  zolpidem (AMBIEN) 5 MG tablet, Take 1 tablet (5 mg total) by mouth at bedtime as needed for sleep., Disp: 90 tablet, Rfl: 1  Allergies  Allergen Reactions  . Amoxicillin Hives  . Sulfa Antibiotics Rash    Childhood rxn    OBJECTIVE: Ht 5\' 4"  (1.626 m)   Wt 168 lb 3 oz (76.3 kg)   LMP 07/30/2009 (Within Years)   BMI 28.87 kg/m  Gen: No acute distress. Nontoxic in appearance.  HENT: AT. [place].  MMM.  Eyes: Conjunctiva without redness, discharge or icterus. CV: no edema Chest: Cough or shortness of breath not present.  Skin: no rashes, purpura or petechiae.  Neuro:  Normal gait. Alert. Oriented x3  Psych:  Normal affect, dress and demeanor. Normal speech. Normal thought content and judgment.  ASSESSMENT AND PLAN: [name] is a 58 y.o. female present for  Insomnia, unspecified type - Stable - refills on ambien 5 mg QHS today.  - [facility] database reviewed 11/21/18 and part of permanent record.  - Contract signed.  - F/U 6 month  or  when needing refills.   Mixed hyperlipidemia/ On statin therapy/Family history of premature coronary artery disease - Continue pravastatin.  - continue fish oil 1000 mg QD  Discussed with her today-secondary to COVID-19/social distancing, I feel she is okay to wait another 6 months to have her cholesterol rechecked given the rest of her variables responded very well to statin and since the above collection she started fish oil also.  She agrees. -Continue exercise and diet modifications - next appt lipid panel.   Prediabetes - last A1c 6.1--> 5.8--> will check yearly (next appt due)  Vitamin D deficiency/osteopenia osteopeniaOsteopenia - taking her daily supplement. - will check yearly at CPE  > 15 minutes spent with patient, >50% of time spent face to face    Howard Pouch, DO 11/21/2018

## 2019-01-14 DIAGNOSIS — L649 Androgenic alopecia, unspecified: Secondary | ICD-10-CM | POA: Diagnosis not present

## 2019-03-02 ENCOUNTER — Other Ambulatory Visit: Payer: Self-pay

## 2019-03-02 NOTE — Progress Notes (Signed)
58 y.o. G2P2 Married Caucasian female here for annual exam.    No bladder or bowel problems.  No urinary incontinence.   Patient retired from [employer].  Husband working in [place].  PCP: Howard Pouch, DO    Patient's last menstrual period was 07/30/2009 (within years).           Sexually active: Yes.    The current method of family planning is status post hysterectomy.    Exercising: Yes.    walks 30 minutes daily. Smoker:  no  Health Maintenance: Pap:  2013 normal History of abnormal Pap:  no MMG: 07-21-18 Screening/w/Diag.Rt.& Rt.US--Rt.Br.cysts/Neg/density c/BiRads2 Colonoscopy: 2011 normal in [place] due 2021 BMD: 07-09-17  Result :mild osteopenia TDaP:  2014 Gardasil:   N/A FXJ:OITGPQD blood Hep C:donates blood Screening Labs:  PCP Shingrix:  completed   reports that she has never smoked. She has never used smokeless tobacco. She reports current alcohol use. She reports that she does not use drugs.  Past Medical History:  Diagnosis Date  . Arthritis    neck  . Diverticulosis of colon   . Elevated cholesterol with elevated triglycerides   . Eustachian tube dysfunction, left 05/22/2018  . History of rectocele   . Insomnia    started ambien 5 ya.  . Osteopenia    --left femur  . Periodic heart flutter   . SVD (spontaneous vaginal delivery)    x 2  . Urinary incontinence     Past Surgical History:  Procedure Laterality Date  . BLADDER SUSPENSION N/A 11/10/2013   Procedure: TRANSVAGINAL TAPE (TVT) PROCEDURE;  Surgeon: Jamey Reas de Berton Lan, MD;  Location: [facility];  Service: Gynecology;  Laterality: N/A;  . COLONOSCOPY    . CYSTOSCOPY N/A 11/10/2013   Procedure: CYSTOSCOPY;  Surgeon: Jamey Reas de Berton Lan, MD;  Location: [facility];  Service: Gynecology;  Laterality: N/A;  . IRRIGATION AND DEBRIDEMENT SEBACEOUS CYST  03/2017  . VAGINAL HYSTERECTOMY  11/11    Vag cuff, mild rectocele  . WISDOM TOOTH EXTRACTION      Current  Outpatient Medications  Medication Sig Dispense Refill  . baclofen (LIORESAL) 10 MG tablet Take 1 tablet (10 mg total) by mouth 2 (two) times daily. 90 each 1  . Biotin 10000 MCG TABS Take 1 tablet by mouth daily.    . Calcium Carbonate-Vit D-Min (CALTRATE 600+D PLUS MINERALS) 600-800 MG-UNIT TABS Take by mouth.    . Cholecalciferol (VITAMIN D3) 50 MCG (2000 UT) TABS Take 1 tablet by mouth daily.    . iron polysaccharides (NU-IRON) 150 MG capsule Take 150 mg by mouth daily.    . Multiple Vitamins-Minerals ([facility]S SPECTRAVITE ADVANCED PO) Take 1 tablet by mouth daily.    . Omega-3 Fatty Acids (FISH OIL) 1000 MG CAPS Take 1 capsule by mouth.    . pantoprazole (PROTONIX) 20 MG tablet Take 1 tablet (20 mg total) by mouth daily. Needs office visit for further refills. 90 tablet 3  . pravastatin (PRAVACHOL) 20 MG tablet Take 1 tablet (20 mg total) by mouth daily. 90 tablet 3  . Spironolactone 25 MG/5ML SUSP Take 1 tablet by mouth 2 (two) times daily.    Marland Kitchen zolpidem (AMBIEN) 5 MG tablet Take 1 tablet (5 mg total) by mouth at bedtime as needed for sleep. 90 tablet 1   No current facility-administered medications for this visit.     Family History  Problem Relation Age of Onset  . Arthritis Mother   . Hypertension  Mother   . Hyperlipidemia Mother   . Heart disease Mother 77       dec  . Hypertension Father   . Hyperlipidemia Father   . Heart disease Father 58       smoked for 10 yrs., no muscle damage, 1 MI   . Pulmonary fibrosis Father        diagnosed end stage, cause unkn.  Marland Kitchen Urolithiasis Father   . Hyperlipidemia Brother   . Urolithiasis Brother   . Diabetes Son   . Asthma Son   . Seizures Son   . Arthritis Maternal Grandmother   . Arthritis Paternal Grandmother   . Asthma Son   . Diverticulosis Sister     Review of Systems  All other systems reviewed and are negative.   Exam:   BP 140/80   Pulse 76   Temp (!) 96.8 F (36 C) (Temporal)   Ht 5\' 4"  (1.626 m)   Wt 171 lb 12.8  oz (77.9 kg)   LMP 07/30/2009 (Within Years)   BMI 29.49 kg/m     General appearance: alert, cooperative and appears stated age Head: normocephalic, without obvious abnormality, atraumatic Neck: no adenopathy, supple, symmetrical, trachea midline and thyroid normal to inspection and palpation Lungs: clear to auscultation bilaterally Breasts: normal appearance, no masses or tenderness, No nipple retraction or dimpling, No nipple discharge or bleeding, No axillary adenopathy Heart: regular rate and rhythm Abdomen: soft, non-tender; no masses, no organomegaly Extremities: extremities normal, atraumatic, no cyanosis or edema Skin: skin color, texture, turgor normal. No rashes or lesions Lymph nodes: cervical, supraclavicular, and axillary nodes normal. Neurologic: grossly normal  Pelvic: External genitalia:  no lesions              No abnormal inguinal nodes palpated.              Urethra:  normal appearing urethra with no masses, tenderness or lesions              Bartholins and Skenes: normal                 Vagina: normal appearing vagina with normal color and discharge, no lesions.  Mild cystocele.              Cervix:  absent              Pap taken: No. Bimanual Exam:  Uterus:  absent              Adnexa: no mass, fullness, tenderness              Rectal exam: Yes.  .  Confirms.              Anus:  normal sphincter tone, no lesions  Chaperone was present for exam.  Assessment:   Well woman visit with normal exam. Status post TVH.  Status post TVT.  Mild osteopenia. Elevated cholesterol and TG. FH CAD.   Plan: Mammogram screening discussed. Self breast awareness reviewed. Pap and HR HPV as above. Guidelines for Calcium, Vitamin D, regular exercise program including cardiovascular and weight bearing exercise. BMD next year.  Labs with PCP.  Follow up annually and prn.   After visit summary provided.

## 2019-03-04 ENCOUNTER — Encounter: Payer: Self-pay | Admitting: Obstetrics and Gynecology

## 2019-03-04 ENCOUNTER — Ambulatory Visit (INDEPENDENT_AMBULATORY_CARE_PROVIDER_SITE_OTHER): Payer: BC Managed Care – PPO | Admitting: Obstetrics and Gynecology

## 2019-03-04 ENCOUNTER — Ambulatory Visit: Payer: PRIVATE HEALTH INSURANCE | Admitting: Obstetrics and Gynecology

## 2019-03-04 ENCOUNTER — Other Ambulatory Visit: Payer: Self-pay

## 2019-03-04 VITALS — BP 140/80 | HR 76 | Temp 96.8°F | Ht 64.0 in | Wt 171.8 lb

## 2019-03-04 DIAGNOSIS — Z01419 Encounter for gynecological examination (general) (routine) without abnormal findings: Secondary | ICD-10-CM | POA: Diagnosis not present

## 2019-03-04 NOTE — Patient Instructions (Signed)

## 2019-03-19 ENCOUNTER — Telehealth: Payer: Self-pay

## 2019-03-19 NOTE — Telephone Encounter (Signed)
Faxed refill request for patients Baclofen. Pt was called and she is only taking baclofen 1x daily. Pharmacy was called and pt picked up #180 tablets 11/21/2018.

## 2019-03-25 ENCOUNTER — Telehealth: Payer: Self-pay

## 2019-03-25 NOTE — Telephone Encounter (Signed)
ERROR

## 2019-03-29 ENCOUNTER — Encounter: Payer: Self-pay | Admitting: Family Medicine

## 2019-03-30 MED ORDER — BACLOFEN 10 MG PO TABS: 10.0000 mg | ORAL_TABLET | Freq: Two times a day (BID) | ORAL | 1 refills | Status: DC

## 2019-03-30 NOTE — Telephone Encounter (Signed)
RF request for Baclofen   LOV: 11/21/2018 Next ov: 05/04/2019 Last written: 11/21/2018 #90 x1 refill   Pt was called and is taking 2 pills some days and she is almost out of medication. Pharmacy was called and states that the #90 was not a full 90 day supply and she does not have refills left on file. Please advise.

## 2019-03-30 NOTE — Telephone Encounter (Signed)
refilled 

## 2019-04-12 ENCOUNTER — Encounter: Payer: Self-pay | Admitting: Family Medicine

## 2019-04-13 NOTE — Telephone Encounter (Signed)
Patient sent a mychart message stating thtat [facility] and didn't let her know it was ready. Disregard this message.

## 2019-04-30 DIAGNOSIS — Z23 Encounter for immunization: Secondary | ICD-10-CM | POA: Diagnosis not present

## 2019-05-04 ENCOUNTER — Ambulatory Visit (INDEPENDENT_AMBULATORY_CARE_PROVIDER_SITE_OTHER): Payer: BC Managed Care – PPO | Admitting: Family Medicine

## 2019-05-04 ENCOUNTER — Encounter: Payer: Self-pay | Admitting: Family Medicine

## 2019-05-04 ENCOUNTER — Encounter: Payer: Self-pay | Admitting: Obstetrics and Gynecology

## 2019-05-04 ENCOUNTER — Other Ambulatory Visit: Payer: Self-pay

## 2019-05-04 VITALS — BP 126/77 | HR 72 | Temp 97.2°F | Resp 16 | Ht 64.0 in | Wt 168.1 lb

## 2019-05-04 DIAGNOSIS — G4700 Insomnia, unspecified: Secondary | ICD-10-CM

## 2019-05-04 DIAGNOSIS — K219 Gastro-esophageal reflux disease without esophagitis: Secondary | ICD-10-CM | POA: Diagnosis not present

## 2019-05-04 DIAGNOSIS — E559 Vitamin D deficiency, unspecified: Secondary | ICD-10-CM | POA: Diagnosis not present

## 2019-05-04 DIAGNOSIS — L659 Nonscarring hair loss, unspecified: Secondary | ICD-10-CM

## 2019-05-04 DIAGNOSIS — R7303 Prediabetes: Secondary | ICD-10-CM | POA: Diagnosis not present

## 2019-05-04 DIAGNOSIS — E782 Mixed hyperlipidemia: Secondary | ICD-10-CM

## 2019-05-04 DIAGNOSIS — Z8249 Family history of ischemic heart disease and other diseases of the circulatory system: Secondary | ICD-10-CM | POA: Diagnosis not present

## 2019-05-04 DIAGNOSIS — Z79899 Other long term (current) drug therapy: Secondary | ICD-10-CM

## 2019-05-04 DIAGNOSIS — M8580 Other specified disorders of bone density and structure, unspecified site: Secondary | ICD-10-CM

## 2019-05-04 LAB — LIPID PANEL
Cholesterol: 185 mg/dL (ref 0–200)
HDL: 41.9 mg/dL (ref >39.00)
NonHDL: 143.25
Total CHOL/HDL Ratio: 4
Triglycerides: 211 mg/dL — ABNORMAL HIGH (ref 0.0–149.0)
VLDL: 42.2 mg/dL — ABNORMAL HIGH (ref 0.0–40.0)

## 2019-05-04 LAB — COMPREHENSIVE METABOLIC PANEL
ALT: 16 U/L (ref 0–35)
AST: 14 U/L (ref 0–37)
Albumin: 4.8 g/dL (ref 3.5–5.2)
Alkaline Phosphatase: 55 U/L (ref 39–117)
BUN: 21 mg/dL (ref 6–23)
CO2: 27 mEq/L (ref 19–32)
Calcium: 10.3 mg/dL (ref 8.4–10.5)
Chloride: 105 mEq/L (ref 96–112)
Creatinine, Ser: 0.86 mg/dL (ref 0.40–1.20)
GFR: 67.33 mL/min (ref >60.00)
Glucose, Bld: 96 mg/dL (ref 70–99)
Potassium: 4.5 mEq/L (ref 3.5–5.1)
Sodium: 141 mEq/L (ref 135–145)
Total Bilirubin: 0.5 mg/dL (ref 0.2–1.2)
Total Protein: 7.2 g/dL (ref 6.0–8.3)

## 2019-05-04 LAB — VITAMIN D 25 HYDROXY (VIT D DEFICIENCY, FRACTURES): VITD: 70.82 ng/mL (ref 30.00–100.00)

## 2019-05-04 LAB — HEMOGLOBIN A1C: Hgb A1c MFr Bld: 5.8 % (ref 4.6–6.5)

## 2019-05-04 LAB — LDL CHOLESTEROL, DIRECT: Direct LDL: 112 mg/dL

## 2019-05-04 LAB — TSH: TSH: 2.4 u[IU]/mL (ref 0.35–4.50)

## 2019-05-04 MED ORDER — ZOLPIDEM TARTRATE 5 MG PO TABS: 2.5000 mg | ORAL_TABLET | Freq: Every evening | ORAL | 1 refills | Status: DC | PRN

## 2019-05-04 NOTE — Patient Instructions (Addendum)
Great to see you today.  We will call you with lab results.   physical in 6 months.

## 2019-05-04 NOTE — Progress Notes (Signed)
SUBJECTIVE Chief Complaint  Patient presents with  . Insomnia    has not taken medication today. she does have a couple concerns she would like to discuss  . Medication Refill    HPI: [name] is a 58 y.o. female present for [facility] Insomnia/neck pain: Patient reports insomnia is well controlled on Ambien 5 mg daily at bedtime. She has been on this medication since ~2012. She routinely  takes his medication,sometimes only a half tab. She denies negative side effects. She needed refills on his medication today.  Baclofen for neck pain. Needs refilled  Mixed hyperlipidemia/ On statin therapy/Family history of premature coronary artery disease Tolerating statin and fish oil 1000 mg QD.   Walking everyday now about 4 miles.  Taking pravastatin 20 mg a day.  05/22/2018--> total cholesterol 175, HDL 3, LDL 101, triglycerides 277 (then added fish oil).    Prediabetes HgB A1c  6.1>> 5.8 last check. Exercising daily.   Vitamin D deficiency Taking daily supplement- last Vit D 05/22/2018--> 33  Hair loss: she is under the care of dermatology. They started her on spirolactone and iron- no labs have been collected on her.  ROS: See pertinent positives and negatives per HPI.  Patient Active Problem List   Diagnosis Date Noted  . Prediabetes 05/22/2018  . Mixed hyperlipidemia 05/22/2018  . On statin therapy 05/22/2018  . GERD (gastroesophageal reflux disease) 01/02/2016  . Osteopenia 08/11/2015  . Ulnar nerve compression 03/29/2015  . Vitamin D deficiency 11/23/2014  . Insomnia 01/19/2013  . Family history of premature coronary artery disease 01/19/2013    Social History   Tobacco Use  . Smoking status: Never Smoker  . Smokeless tobacco: Never Used  Substance Use Topics  . Alcohol use: Yes    Alcohol/week: 0.0 standard drinks    Comment: ocassional wine-1 glass every 2 weeks    Current Outpatient Medications:  .  baclofen (LIORESAL) 10 MG tablet, Take 1 tablet (10  mg total) by mouth 2 (two) times daily., Disp: 180 each, Rfl: 1 .  Biotin 10000 MCG TABS, Take 1 tablet by mouth daily. Taking 5011mg, Disp: , Rfl:  .  Calcium Carbonate-Vit D-Min (CALTRATE 600+D PLUS MINERALS) 600-800 MG-UNIT TABS, Take by mouth., Disp: , Rfl:  .  Cholecalciferol (VITAMIN D3) 50 MCG (2000 UT) TABS, Take 1 tablet by mouth daily., Disp: , Rfl:  .  iron polysaccharides (NU-IRON) 150 MG capsule, Take 150 mg by mouth daily., Disp: , Rfl:  .  Omega-3 Fatty Acids (FISH OIL) 1000 MG CAPS, Take 1 capsule by mouth. 12060m Disp: , Rfl:  .  pantoprazole (PROTONIX) 20 MG tablet, Take 1 tablet (20 mg total) by mouth daily. Needs office visit for further refills., Disp: 90 tablet, Rfl: 3 .  pravastatin (PRAVACHOL) 20 MG tablet, Take 1 tablet (20 mg total) by mouth daily., Disp: 90 tablet, Rfl: 3 .  Spironolactone 25 MG/5ML SUSP, Take 1 tablet by mouth 2 (two) times daily., Disp: , Rfl:  .  Multiple Vitamins-Minerals (CVS SPECTRAVITE ADVANCED PO), Take 1 tablet by mouth daily., Disp: , Rfl:  .  zolpidem (AMBIEN) 5 MG tablet, Take 1 tablet (5 mg total) by mouth at bedtime as needed for sleep., Disp: 90 tablet, Rfl: 1  Allergies  Allergen Reactions  . Amoxicillin Hives  . Sulfa Antibiotics Rash    Childhood rxn    OBJECTIVE: BP 126/77 (BP Location: Left Arm, Patient Position: Sitting, Cuff Size: Normal)   Pulse 72   Temp (!Marland Kitchen  97.2 F (36.2 C) (Temporal)   Resp 16   Ht _0  (1.626 m)   Wt 168 lb 2 oz (76.3 kg)   LMP 07/30/2009 (Within Years)   SpO2 95%   BMI 28.86 kg/m  Gen: Afebrile. No acute distress.  HENT: AT. [place].  Eyes:Pupils Equal Round Reactive to light, Extraocular movements intact,  Conjunctiva without redness, discharge or icterus. Neck/lymp/endocrine: Supple,no lymphadenopathy, no thyromegaly CV: RRR no murmur, no edema, +2/4 P posterior tibialis pulses Chest: CTAB, no wheeze or crackles Skin: no rashes, purpura or petechiae. Scalp hair thinning in female pattern  balding.   Neuro:  Normal gait. PERLA. EOMi. Alert. Oriented x3 C Psych: Normal affect, dress and demeanor. Normal speech. Normal thought content and judgment.   ASSESSMENT AND PLAN: [name] is a 58 y.o. female present for  Insomnia, unspecified type - stable. Mostly only using 2.5 mg QD.  - refills on ambien 5 mg QHS today.  - [facility] database reviewed 05/04/19 and part of permanent record.  - Contract signed.  - F/U 6 month  or  when needing refills.   Mixed hyperlipidemia/ On statin therapy/Family history of premature coronary artery disease - stable. She has made great dietary and exercise changes.  - continue  pravastatin.  - continue fish oil 1000 mg QD - lipids today.   Prediabetes - last A1c 6.1--> 5.8--> collected today  Vitamin D deficiency/Osteopenia - taking her daily supplement. - vit d checked today.   Hair loss:  Under the care of derm for condition. Started on spiro and iron in June by Derm- no labs.  - CMP, vit d, tsh., iron panel collected today.   > 25 minutes spent with patient, >50% of time spent face to face   CPE in 6 mos.   Orders Placed This Encounter  Procedures  . Lipid panel  . Hemoglobin A1c  . Comp Met (CMET)  . TSH  . Iron, TIBC and Ferritin Panel  . Vitamin D (25 hydroxy)      Howard Pouch, DO 05/04/2019

## 2019-05-04 NOTE — Addendum Note (Signed)
Addended by: Howard Pouch A on: 05/04/2019 09:02 AM   Modules accepted: Orders

## 2019-05-05 ENCOUNTER — Telehealth: Payer: Self-pay | Admitting: Obstetrics and Gynecology

## 2019-05-05 ENCOUNTER — Telehealth: Payer: Self-pay | Admitting: Family Medicine

## 2019-05-05 DIAGNOSIS — K219 Gastro-esophageal reflux disease without esophagitis: Secondary | ICD-10-CM

## 2019-05-05 LAB — IRON,TIBC AND FERRITIN PANEL
%SAT: 24 % (calc) (ref 16–45)
Ferritin: 21 ng/mL (ref 16–232)
Iron: 90 ug/dL (ref 45–160)
TIBC: 373 mcg/dL (calc) (ref 250–450)

## 2019-05-05 MED ORDER — PANTOPRAZOLE SODIUM 20 MG PO TBEC: 20.0000 mg | DELAYED_RELEASE_TABLET | Freq: Every day | ORAL | 1 refills | Status: DC

## 2019-05-05 MED ORDER — PRAVASTATIN SODIUM 20 MG PO TABS: 20.0000 mg | ORAL_TABLET | Freq: Every day | ORAL | 3 refills | Status: DC

## 2019-05-05 NOTE — Telephone Encounter (Signed)
Patient sent the following correspondence through [place].  My older sister was just diagnosed with breast cancer. I believe I may be considered high risk now. Im getting ready to schedule my mammogram for December, should I ask for an ultrasound as well? MRI? If so, do you order it?

## 2019-05-05 NOTE — Telephone Encounter (Signed)
Pt was called and message was left for patient to return call

## 2019-05-05 NOTE — Progress Notes (Signed)
GYNECOLOGY  VISIT   HPI: 58 y.o.   Married  Caucasian  female   G2P2 with Patient's last menstrual period was 07/30/2009 (within years).   here for consult. Patient's sister recently diagnosed with breast cancer.   Her sister is 53 now and has not done genetic testing to date.   Asking what the guidelines are for her now.  She denies any breast problems.  She has some right axillary pain  For 1 - 2 weeks, but thinks it is due to exercising.   No other family with breast cancer.  No uterine or ovarian cancer.  No colon cancer or prostate cancer.   GYNECOLOGIC HISTORY: Patient's last menstrual period was 07/30/2009 (within years). Contraception: Hysterectomy Menopausal hormone therapy:  none Last mammogram: 07-21-18 Screening/w/Diag.Rt.& Rt.US--Rt.Br.cysts/Neg/density c/BiRads2 Last pap smear:  2013 normal        OB History    Gravida  2   Para  2   Term      Preterm      AB      Living  2     SAB      TAB      Ectopic      Multiple      Live Births           Obstetric Comments  Menarche 58 yo.           Patient Active Problem List   Diagnosis Date Noted  . Prediabetes 05/22/2018  . Mixed hyperlipidemia 05/22/2018  . On statin therapy 05/22/2018  . GERD (gastroesophageal reflux disease) 01/02/2016  . Osteopenia 08/11/2015  . Ulnar nerve compression 03/29/2015  . Vitamin D deficiency 11/23/2014  . Insomnia 01/19/2013  . Family history of premature coronary artery disease 01/19/2013    Past Medical History:  Diagnosis Date  . Arthritis    neck  . Diverticulosis of colon   . Elevated cholesterol with elevated triglycerides   . Eustachian tube dysfunction, left 05/22/2018  . History of rectocele   . Insomnia    started ambien 5 ya.  . Osteopenia    --left femur  . Periodic heart flutter   . SVD (spontaneous vaginal delivery)    x 2  . Urinary incontinence     Past Surgical History:  Procedure Laterality Date  . BLADDER SUSPENSION N/A  11/10/2013   Procedure: TRANSVAGINAL TAPE (TVT) PROCEDURE;  Surgeon: Jamey Reas de Berton Lan, MD;  Location: [facility];  Service: Gynecology;  Laterality: N/A;  . COLONOSCOPY    . CYSTOSCOPY N/A 11/10/2013   Procedure: CYSTOSCOPY;  Surgeon: Jamey Reas de Berton Lan, MD;  Location: [facility];  Service: Gynecology;  Laterality: N/A;  . IRRIGATION AND DEBRIDEMENT SEBACEOUS CYST  03/2017  . VAGINAL HYSTERECTOMY  11/11    Vag cuff, mild rectocele  . WISDOM TOOTH EXTRACTION      Current Outpatient Medications  Medication Sig Dispense Refill  . baclofen (LIORESAL) 10 MG tablet Take 1 tablet (10 mg total) by mouth 2 (two) times daily. 180 each 1  . Biotin 10000 MCG TABS Take 1 tablet by mouth daily. Taking 5041mcg    . Calcium Carbonate-Vit D-Min (CALTRATE 600+D PLUS MINERALS) 600-800 MG-UNIT TABS Take by mouth.    . iron polysaccharides (NU-IRON) 150 MG capsule Take 150 mg by mouth daily.    . Multiple Vitamins-Minerals (CVS SPECTRAVITE ADVANCED PO) Take 1 tablet by mouth daily.    . Omega-3 Fatty Acids (FISH OIL) 1000 MG CAPS  Take 1 capsule by mouth. 1200mg     . pantoprazole (PROTONIX) 20 MG tablet Take 1 tablet (20 mg total) by mouth daily. Needs office visit for further refills. 90 tablet 1  . pravastatin (PRAVACHOL) 20 MG tablet Take 1 tablet (20 mg total) by mouth daily. 90 tablet 3  . Spironolactone 25 MG/5ML SUSP Take 1 tablet by mouth 2 (two) times daily.    Marland Kitchen zolpidem (AMBIEN) 5 MG tablet Take 0.5-1 tablets (2.5-5 mg total) by mouth at bedtime as needed for sleep. 90 tablet 1   No current facility-administered medications for this visit.      ALLERGIES: Amoxicillin and Sulfa antibiotics  Family History  Problem Relation Age of Onset  . Arthritis Mother   . Hypertension Mother   . Hyperlipidemia Mother   . Heart disease Mother 24       dec  . Hypertension Father   . Hyperlipidemia Father   . Heart disease Father 31       smoked for 10 yrs., no muscle damage, 1  MI   . Pulmonary fibrosis Father        diagnosed end stage, cause unkn.  Marland Kitchen Urolithiasis Father   . Hyperlipidemia Brother   . Urolithiasis Brother   . Diabetes Son   . Asthma Son   . Seizures Son   . Arthritis Maternal Grandmother   . Arthritis Paternal Grandmother   . Asthma Son   . Diverticulosis Sister   . Breast cancer Sister 49    Social History   Socioeconomic History  . Marital status: Married    Spouse name: Phillip Heal  . Number of children: 2  . Years of education: Masters  . Highest education level: Not on file  Occupational History  . 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 Financial resource strain: Not on file  . Food insecurity    Worry: Not on file    Inability: Not on file  . Transportation needs    Medical: Not on file    Non-medical: Not on file  Tobacco Use  . Smoking status: Never Smoker  . Smokeless tobacco: Never Used  Substance and Sexual Activity  . Alcohol use: Yes    Alcohol/week: 0.0 standard drinks    Comment: ocassional wine-1 glass every 2 weeks  . Drug use: No  . Sexual activity: Yes    Partners: Male    Birth control/protection: Surgical    Comment: hysterectomy--still has ovaries  Lifestyle  . Physical activity    Days per week: Not on file    Minutes per session: Not on file  . Stress: Not on file  Relationships  . Social Herbalist on phone: Not on file    Gets together: Not on file    Attends religious service: Not on file    Active member of club or organization: Not on file    Attends meetings of clubs or organizations: Not on file    Relationship status: Not on file  . Intimate partner violence    Fear of current or ex partner: Not on file    Emotionally abused: Not on file    Physically abused: Not on file    Forced sexual activity: Not on file  Other Topics Concern  . Not on file  Social History Narrative   Relocated to [facility] from [place]. Due to husband job  transfer. 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 She lives with her husband. She has 2 grown sons- oldest recently graduated from college and took a job in [place].; youngest just graduated HS & ia attending [place].      Has had regular preventive care in [place]. At [facility] w/Dr. Alfonse Spruce. Only health concern is insomnia that developed about 5 years ago & has been treated with [place]. She has not practiced sleep hygiene.      Occasionally drinks sweet tea.     Review of Systems  All other systems reviewed and are negative.   PHYSICAL EXAMINATION:    BP 128/84   Pulse 80   Temp 97.9 F (36.6 C) (Temporal)   Ht 5\' 4"  (1.626 m)   Wt 169 lb (76.7 kg)   LMP 07/30/2009 (Within Years)   BMI 29.01 kg/m     General appearance: alert, cooperative and appears stated age   Breasts: normal appearance, no masses or tenderness, No nipple retraction or dimpling, No nipple discharge or bleeding, No axillary adenopathy   Tyrer Cusick model:  10 year risk of breast CA 12.4%, lifetime risk of breast CA 29.8% Chaperone was present for exam.  ASSESSMENT  Right axillary pain.  Hx right breast cysts.  FH breast cancer.  Increased lifetime risk of breast cancer.   PLAN  Dx right mammogram and right breast US.  Yearly breast MRI recommended. Referral to high risk breast cancer screening clinic.   An After Visit Summary was printed and given to the patient.  __25____ minutes face to face time of which over 50% was spent in counseling.

## 2019-05-05 NOTE — Telephone Encounter (Signed)
Pt was called and given information, she was fasting for labs. She verbalized understanding on instructions.

## 2019-05-05 NOTE — Telephone Encounter (Signed)
Call to patient. Patient states her sister was recently diagnosed with breast cancer at 8. Patient asking what the next steps for her mammogram should be now that she has a family history? RN advised OV recommended for consult with Dr. Quincy Simmonds to discuss new family history of breast cancer and scheduling of MMG or other diagnostic testing. Patient agreeable. Patient scheduled for 05-06-2019 at 1130. Patient agreeable to date and time of appointment.   Routing to provider and will close encounter.

## 2019-05-05 NOTE — Telephone Encounter (Signed)
Please inform patient the following information: Her labs are all normal, with the exception of her mildly elevated triglycerides if she was fasting. If she was not fasting they are normal.    - If she was fasting- then I would suggest increase in her fish oil dosage daily by an additional capsule.    - continue exercise and dietary modifications she has been doing, her labs and a1c/diabetes screen look great.  - as for the labs surrounding her hair loss- her electrolytes are normal. Her iron panel and vi t d are in normal range also.  So the does of spirolactone, vit d and iron  she is on by her dermatologist are ok.

## 2019-05-06 ENCOUNTER — Telehealth: Payer: Self-pay | Admitting: Obstetrics and Gynecology

## 2019-05-06 ENCOUNTER — Other Ambulatory Visit: Payer: Self-pay | Admitting: Obstetrics and Gynecology

## 2019-05-06 ENCOUNTER — Ambulatory Visit (INDEPENDENT_AMBULATORY_CARE_PROVIDER_SITE_OTHER): Payer: BC Managed Care – PPO | Admitting: Obstetrics and Gynecology

## 2019-05-06 ENCOUNTER — Encounter: Payer: Self-pay | Admitting: Obstetrics and Gynecology

## 2019-05-06 ENCOUNTER — Other Ambulatory Visit: Payer: Self-pay

## 2019-05-06 VITALS — BP 128/84 | HR 80 | Temp 97.9°F | Ht 64.0 in | Wt 169.0 lb

## 2019-05-06 DIAGNOSIS — N644 Mastodynia: Secondary | ICD-10-CM

## 2019-05-06 DIAGNOSIS — Z803 Family history of malignant neoplasm of breast: Secondary | ICD-10-CM | POA: Diagnosis not present

## 2019-05-06 DIAGNOSIS — Z9189 Other specified personal risk factors, not elsewhere classified: Secondary | ICD-10-CM | POA: Diagnosis not present

## 2019-05-06 DIAGNOSIS — M79621 Pain in right upper arm: Secondary | ICD-10-CM | POA: Diagnosis not present

## 2019-05-06 NOTE — Telephone Encounter (Signed)
Message left to return call to Triage Nurse at [phone].    

## 2019-05-06 NOTE — Telephone Encounter (Signed)
Please contact patient to schedule a dx right mammogram, right breast US and right axillary [place] at [facility].  She has right axillary pain, hx right breast cysts.   She has a sister just dx with breast cancer.   Patient has increased lifetime risk of breast cancer.

## 2019-05-06 NOTE — Telephone Encounter (Signed)
Call to [place], spoke with Dub Mikes. Patient scheduled for right diagnostic MMG and right breast ultrasound on 05-11-2019. Dub Mikes states breast ultrasound will include axilla region. Orders placed for cosign. RN advised would contact patient with appointment details and provide phone number if patient needs to reschedule appointment.

## 2019-05-06 NOTE — Telephone Encounter (Signed)
Call to patient. Patient advised of Diagnostic MMG/ ultrasound appointment at [facility] on 05-11-2019 at 1310 with 1250 arrival time. Patient agreeable to date and time of appointment. Address for [facility] provided to patient.   Routing to provider and will close encounter.

## 2019-05-07 ENCOUNTER — Telehealth: Payer: Self-pay | Admitting: Family Medicine

## 2019-05-07 NOTE — Telephone Encounter (Signed)
Error in opening telephone note. 

## 2019-05-08 ENCOUNTER — Telehealth: Payer: Self-pay | Admitting: Genetic Counselor

## 2019-05-08 NOTE — Telephone Encounter (Signed)
Received a genetic counseling referral from Dr. Quincy Simmonds for breast cancer diagnosis in sister. Pt has been scheduled to see [name] on [id number] at 1pm. She's aware to arrive 15 minutes early.

## 2019-05-11 ENCOUNTER — Telehealth: Payer: Self-pay | Admitting: Genetic Counselor

## 2019-05-11 ENCOUNTER — Ambulatory Visit
Admission: RE | Admit: 2019-05-11 | Discharge: 2019-05-11 | Disposition: A | Discharge status: Home / Self Care | Payer: BC Managed Care – PPO | Source: Ambulatory Visit | Attending: Obstetrics and Gynecology | Admitting: Obstetrics and Gynecology

## 2019-05-11 ENCOUNTER — Other Ambulatory Visit: Payer: Self-pay

## 2019-05-11 DIAGNOSIS — N644 Mastodynia: Secondary | ICD-10-CM

## 2019-05-11 DIAGNOSIS — R928 Other abnormal and inconclusive findings on diagnostic imaging of breast: Secondary | ICD-10-CM | POA: Diagnosis not present

## 2019-05-11 DIAGNOSIS — N6489 Other specified disorders of breast: Secondary | ICD-10-CM | POA: Diagnosis not present

## 2019-05-11 NOTE — Telephone Encounter (Signed)
Called patient regarding upcoming [facility] appointment, per patient's request this will be a walk-in visit. °

## 2019-05-12 ENCOUNTER — Inpatient Hospital Stay: Payer: BC Managed Care – PPO

## 2019-05-12 ENCOUNTER — Other Ambulatory Visit: Payer: Self-pay

## 2019-05-12 ENCOUNTER — Inpatient Hospital Stay: Payer: BC Managed Care – PPO | Attending: Genetic Counselor | Admitting: Genetic Counselor

## 2019-05-12 ENCOUNTER — Other Ambulatory Visit: Payer: Self-pay | Admitting: Genetic Counselor

## 2019-05-12 DIAGNOSIS — Z8049 Family history of malignant neoplasm of other genital organs: Secondary | ICD-10-CM

## 2019-05-12 DIAGNOSIS — Z803 Family history of malignant neoplasm of breast: Secondary | ICD-10-CM

## 2019-05-12 DIAGNOSIS — Z9189 Other specified personal risk factors, not elsewhere classified: Secondary | ICD-10-CM

## 2019-05-13 ENCOUNTER — Encounter: Payer: Self-pay | Admitting: Genetic Counselor

## 2019-05-13 DIAGNOSIS — Z803 Family history of malignant neoplasm of breast: Secondary | ICD-10-CM | POA: Insufficient documentation

## 2019-05-13 DIAGNOSIS — Z8049 Family history of malignant neoplasm of other genital organs: Secondary | ICD-10-CM | POA: Insufficient documentation

## 2019-05-13 NOTE — Progress Notes (Signed)
REFERRING PROVIDER: Nunzio Cobbs, MD [address] [place],  [place] [address]  PRIMARY PROVIDER:  Howard Pouch A, DO  PRIMARY REASON FOR VISIT:  1. Family history of breast cancer   2. Family history of cervical cancer   3. Increased risk of breast cancer      HISTORY OF PRESENT ILLNESS:   [name], a 58 y.o. female, was seen for a [facility] cancer genetics consultation at the request of Dr. Yisroel Ramming due to a family history of breast cancer.  [name] presents to clinic today to discuss the possibility of a hereditary predisposition to cancer, genetic testing, and to further clarify her future cancer risks, as well as potential cancer risks for family members.   [name] is a 58 y.o. female with no personal history of cancer.    CANCER HISTORY:  Oncology History   No history exists.     RISK FACTORS:  Menarche was at age 70.  First live birth at age 1.  OCP use for approximately 29 years.  Ovaries intact: yes.  Hysterectomy: yes.  Menopausal status: postmenopausal.  HRT use: 0 years. Colonoscopy: yes; normal in 2011, per patient. Mammogram within the last year: yes. Number of breast biopsies: 0.   Past Medical History:  Diagnosis Date   Arthritis    neck   Diverticulosis of colon    Elevated cholesterol with elevated triglycerides    Eustachian tube dysfunction, left 05/22/2018   Family history of breast cancer    Family history of cervical cancer    History of rectocele    Insomnia    started ambien 5 ya.   Osteopenia    --left femur   Periodic heart flutter    SVD (spontaneous vaginal delivery)    x 2   Urinary incontinence     Past Surgical History:  Procedure Laterality Date   BLADDER SUSPENSION N/A 11/10/2013   Procedure: TRANSVAGINAL TAPE (TVT) PROCEDURE;  Surgeon: Jamey Reas de Berton Lan, MD;  Location: [facility];  Service: Gynecology;  Laterality: N/A;   COLONOSCOPY     CYSTOSCOPY  N/A 11/10/2013   Procedure: CYSTOSCOPY;  Surgeon: Jamey Reas de Berton Lan, MD;  Location: [facility];  Service: Gynecology;  Laterality: N/A;   IRRIGATION AND DEBRIDEMENT SEBACEOUS CYST  03/2017   VAGINAL HYSTERECTOMY  11/11    Vag cuff, mild rectocele   WISDOM TOOTH EXTRACTION      Social History   Socioeconomic History   Marital status: Married    Spouse name: Phillip Heal   Number of children: 2   Years of education: Masters   Highest education level: Not on file  Occupational History   Occupation: Pharmacist, hospital, reading specialist k-6    Comment: Retired  Scientist, product/process development strain: Not on file   Food insecurity    Worry: Not on file    Inability: Not on Lexicographer needs    Medical: Not on file    Non-medical: Not on file  Tobacco Use   Smoking status: Never Smoker   Smokeless tobacco: Never Used  Substance and Sexual Activity   Alcohol use: Yes    Alcohol/week: 0.0 standard drinks    Comment: ocassional wine-1 glass every 2 weeks   Drug use: No   Sexual activity: Yes    Partners: Male    Birth control/protection: Surgical    Comment: hysterectomy--still has ovaries  Lifestyle  Physical activity    Days per week: Not on file    Minutes per session: Not on file   Stress: Not on file  Relationships   Social connections    Talks on phone: Not on file    Gets together: Not on file    Attends religious service: Not on file    Active member of club or organization: Not on file    Attends meetings of clubs or organizations: Not on file    Relationship status: Not on file  Other Topics Concern   Not on file  Social History Narrative   Relocated to [facility] [place] Due to husband job transfer. 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 She lives with her husband. She has 2 grown sons- oldest recently graduated from college and took a job in [place].;  youngest just graduated HS & ia attending [place].      Has had regular preventive care in [place]. At [facility] w/Dr. Alfonse Spruce. Only health concern is insomnia that developed about 5 years ago & has been treated with [place]. She has not practiced sleep hygiene.      Occasionally drinks sweet tea.      FAMILY HISTORY:  We obtained a detailed, 4-generation family history.  Significant diagnoses are listed below: Family History  Problem Relation Age of Onset   Arthritis Mother    Hypertension Mother    Hyperlipidemia Mother    Heart disease Mother 59       dec   Hypertension Father    Hyperlipidemia Father    Heart disease Father 5       smoked for 10 yrs., no muscle damage, 1 MI    Pulmonary fibrosis Father        diagnosed end stage, cause unkn.   Urolithiasis Father    Hyperlipidemia Brother    Urolithiasis Brother    Diabetes Son    Asthma Son    Seizures Son    Arthritis Maternal Grandmother    Cervical cancer Maternal Grandmother        diagnosed in her 73s or 12s   Arthritis Paternal Grandmother    Asthma Son    Diverticulosis Sister    Breast cancer Sister 55    [name] has two sons, one who is 18 and one who is 41. She has two brothers who are 69 and 78, and a sister who is 24 and was recently diagnosed with triple negative breast cancer. This sister has not yet had genetic testing for her cancer, although she is interested. Of note, the sister has also had a hysterectomy and had her ovaries removed at age 13.   [name]'s mother died at the age of 35 and did not have cancer. Her mother did have a hysterectomy at age 58 due to fibroids. [name] has one maternal aunt who is 35, and two cousins who have not had cancer. Her maternal grandmother died at age 24 and had cervical cancer in her 109s or 66s. [name] maternal grandfather died at age 75 and did not have cancer.  [name] father died at age 94 and did not have cancer. She did  not have any paternal aunts or uncles. Her paternal grandmother died at age 41, and her grandfather died at age 39. Both of these individuals had Alzheimers, but neither had cancer.   [name]  is unaware of previous family history of genetic testing for hereditary cancer risks. There is no reported Ashkenazi Jewish ancestry. There is no known consanguinity.  GENETIC COUNSELING ASSESSMENT: [name] is a 58 y.o. female with a family history of breast cancer which is not highly suggestive of a hereditary cancer syndrome. We, therefore, discussed and recommended the following at today's visit.   DISCUSSION: We discussed that 5 - 10% of breast cancer is hereditary, with most cases associated with BRCA1/2.  There are other genes that can be associated with hereditary breast cancer cancer syndromes.  These include ATM, CHEK2, PALB2, etc.  We discussed that identifying a hereditary cancer syndrome is beneficial for several reasons including knowing about other cancer risks, identifying potential screening and risk-reduction options that may be appropriate, and to understand if other family members could be at risk for cancer and allow them to undergo genetic testing.   We reviewed the characteristics, features and inheritance patterns of hereditary cancer syndromes. We also discussed genetic testing, including the appropriate family members to test, the process of testing, and insurance coverage.  The most informative person to receive genetic testing would be her sister, who was recently diagnosed with breast cancer.  If her sister's results are positive, then genetic testing would be warranted for [name].  If her sister's results are negative, then genetic testing would not be recommended.  Some people do not want to undergo genetic testing due to fear of genetic discrimination.  A federal law called the Genetic Information Non-Discrimination Act (GINA) of 2008 helps protect individuals against genetic  discrimination based on their genetic test results.  It impacts both health insurance and employment.  With health insurance, it protects against increased premiums, being kicked off insurance or being forced to take a test in order to be insured.  For employment it protects against hiring, firing and promoting decisions based on genetic test results.  Health status due to a cancer diagnosis is not protected under GINA.  Additionally, life, disability, and long-term care insurance is not protected under GINA.    We discussed with [name] that the family history does not meet insurance or NCCN criteria for genetic testing and is not highly consistent with a familial hereditary cancer syndrome.  We feel she is at low risk to harbor a gene mutation associated with such a condition. Thus, we did not recommend any genetic testing, at this time, and recommended [name] continue to follow the cancer screening guidelines given by her primary healthcare provider.    Based on [name]'s personal and family history, a statistical model Midwife) was used to estimate her risk of developing breast cancer. This estimates her lifetime risk of developing breast cancer to be approximately 25.0%. This estimation does not consider any genetic testing results.  The patient's lifetime breast cancer risk is a preliminary estimate based on available information using one of several models endorsed by the Vanderbilt (ACS). The ACS recommends consideration of breast MRI screening as an adjunct to mammography for patients at high risk (defined as 20% or greater lifetime risk).   [name] has been determined to be at high risk for breast cancer.  Therefore, we recommend that annual screening with mammography and breast MRI be performed.  [name] has already been referred to a high risk breast cancer screening clinic to determine a breast cancer screening plan with which she and her physicians are comfortable.        PLAN:  [name] did not wish to pursue genetic testing at today's visit, and would like to wait to see her sister's genetic test results before proceeding. We understand this decision and remain available to coordinate genetic testing at any time in the future. We, therefore, recommend [name] continue to follow the cancer screening guidelines given by her primary healthcare provider.  [name] questions were answered to her satisfaction today. Our contact information was provided should additional questions or concerns arise. Thank you for the referral and allowing [place] to share in the care of your patient.   Clint Guy, MS, [facility] Certified Genetic Counselor [email or web address] Phone: [phone]  The patient was seen for a total of 30 minutes in face-to-face genetic counseling.  This patient was discussed with Drs. Magrinat, Lindi Adie and/or Burr Medico who agrees with the above.   _______________________________________________________________________ For Office Staff:  Number of people involved in session: 1 Was an Intern/ student involved with case: no

## 2019-05-18 ENCOUNTER — Encounter: Payer: Self-pay | Admitting: Obstetrics and Gynecology

## 2019-05-18 ENCOUNTER — Telehealth: Payer: Self-pay | Admitting: Obstetrics and Gynecology

## 2019-05-18 NOTE — Telephone Encounter (Signed)
Responded to question through My Chart. Due for bilateral screening mammogram after 07-22-19.   Routing to provider. Encounter closed.

## 2019-05-18 NOTE — Telephone Encounter (Signed)
Patient sent the following correspondence through Portal.  When I schedule my mammogram for December, will they know to just scan the left breast since I just had the right one done? Will they also do a sonogram or just the mammogram?  [name]

## 2019-05-31 DIAGNOSIS — U071 COVID-19: Secondary | ICD-10-CM

## 2019-05-31 HISTORY — DX: COVID-19: U07.1

## 2019-06-15 ENCOUNTER — Encounter: Payer: Self-pay | Admitting: Family Medicine

## 2019-06-16 ENCOUNTER — Ambulatory Visit (INDEPENDENT_AMBULATORY_CARE_PROVIDER_SITE_OTHER): Payer: BC Managed Care – PPO | Admitting: Family Medicine

## 2019-06-16 ENCOUNTER — Encounter: Payer: Self-pay | Admitting: Family Medicine

## 2019-06-16 ENCOUNTER — Other Ambulatory Visit: Payer: Self-pay

## 2019-06-16 VITALS — Temp 98.3°F | Ht 64.0 in | Wt 161.4 lb

## 2019-06-16 DIAGNOSIS — R058 Other specified cough: Secondary | ICD-10-CM

## 2019-06-16 DIAGNOSIS — R05 Cough: Secondary | ICD-10-CM

## 2019-06-16 DIAGNOSIS — Z20822 Contact with and (suspected) exposure to covid-19: Secondary | ICD-10-CM

## 2019-06-16 DIAGNOSIS — R059 Cough, unspecified: Secondary | ICD-10-CM

## 2019-06-16 DIAGNOSIS — Z20828 Contact with and (suspected) exposure to other viral communicable diseases: Secondary | ICD-10-CM | POA: Diagnosis not present

## 2019-06-16 MED ORDER — BENZONATATE 200 MG PO CAPS: 200.0000 mg | ORAL_CAPSULE | Freq: Two times a day (BID) | ORAL | 0 refills | Status: DC | PRN

## 2019-06-16 NOTE — Progress Notes (Signed)
VIRTUAL VISIT VIA VIDEO  I connected with REDIA TURCO on 06/16/19 at  2:30 PM EST by a video enabled telemedicine application and verified that I am speaking with the correct person using two identifiers. Location patient: Home Location provider: [facility], Office Persons participating in the virtual visit: Patient, Dr. Raoul Pitch and R.Baker, LPN  I discussed the limitations of evaluation and management by telemedicine and the availability of in person appointments. The patient expressed understanding and agreed to proceed.   SUBJECTIVE Chief Complaint  Patient presents with  . Nasal Congestion    Pt has had symptoms x1 week. Husband tested positive for COVID yesterday. Denies fever and SOB.   Marland Kitchen Cough    HPI: [name] is a 58 y.o. female present since Nov. 8-9.  Her husband tested positive for Covid yesterday.  Patient reports cough was initially mildly productive, but now is very dry.  She endorses headache and decreased sense of smell.  She denies nausea, vomit, diarrhea, fever, chills or shortness of breath.  She reports they had been very careful.  They did go to the beach the week prior to onset but wore mask, wash her hands and thoroughly cleanse the beach house rental.  Patient reports she is using Mucinex OTC.  ROS: See pertinent positives and negatives per HPI.  Patient Active Problem List   Diagnosis Date Noted  . Family history of breast cancer   . Family history of cervical cancer   . Increased risk of breast cancer 05/06/2019  . Prediabetes 05/22/2018  . Mixed hyperlipidemia 05/22/2018  . On statin therapy 05/22/2018  . GERD (gastroesophageal reflux disease) 01/02/2016  . Osteopenia 08/11/2015  . Ulnar nerve compression 03/29/2015  . Vitamin D deficiency 11/23/2014  . Insomnia 01/19/2013  . Family history of premature coronary artery disease 01/19/2013    Social History   Tobacco Use  . Smoking status: Never Smoker  . Smokeless tobacco:  Never Used  Substance Use Topics  . Alcohol use: Yes    Alcohol/week: 0.0 standard drinks    Comment: ocassional wine-1 glass every 2 weeks    Current Outpatient Medications:  .  baclofen (LIORESAL) 10 MG tablet, Take 1 tablet (10 mg total) by mouth 2 (two) times daily., Disp: 180 each, Rfl: 1 .  Biotin 10000 MCG TABS, Take 1 tablet by mouth daily. Taking 5013mcg, Disp: , Rfl:  .  Calcium Carbonate-Vit D-Min (CALTRATE 600+D PLUS MINERALS) 600-800 MG-UNIT TABS, Take by mouth., Disp: , Rfl:  .  iron polysaccharides (NU-IRON) 150 MG capsule, Take 150 mg by mouth daily., Disp: , Rfl:  .  Multiple Vitamins-Minerals ([employer] SPECTRAVITE ADVANCED PO), Take 1 tablet by mouth daily., Disp: , Rfl:  .  Omega-3 Fatty Acids (FISH OIL) 1000 MG CAPS, Take 1 capsule by mouth. 1200mg , Disp: , Rfl:  .  pantoprazole (PROTONIX) 20 MG tablet, Take 1 tablet (20 mg total) by mouth daily. Needs office visit for further refills., Disp: 90 tablet, Rfl: 1 .  pravastatin (PRAVACHOL) 20 MG tablet, Take 1 tablet (20 mg total) by mouth daily., Disp: 90 tablet, Rfl: 3 .  Spironolactone 25 MG/5ML SUSP, Take 1 tablet by mouth 2 (two) times daily., Disp: , Rfl:  .  zolpidem (AMBIEN) 5 MG tablet, Take 0.5-1 tablets (2.5-5 mg total) by mouth at bedtime as needed for sleep., Disp: 90 tablet, Rfl: 1  Allergies  Allergen Reactions  . Amoxicillin Hives  . Sulfa Antibiotics Rash    Childhood rxn  OBJECTIVE: Temp 98.3 F (36.8 C) (Temporal)   Ht 5\' 4"  (1.626 m)   Wt 161 lb 7 oz (73.2 kg)   LMP 07/30/2009 (Within Years)   BMI 27.71 kg/m  Gen: No acute distress. Nontoxic in appearance.  HENT: AT. [place].  MMM.  Eyes:Pupils Equal Round Reactive to light, Extraocular movements intact,  Conjunctiva without redness, discharge or icterus. Chest: Cough or shortness of breath not present on exam. Skin: No rashes, purpura or petechiae.  Neuro: Alert. Oriented x3  Psych: Normal affect, dress and demeanor. Normal speech. Normal thought  content and judgment.  ASSESSMENT AND PLAN: [name] is a 58 y.o. female present for  Cough/Cough with exposure to COVID-19 virus Rest.  Hydrate. -Continue Mucinex. -Prescribed Tessalon Perles. - benzonatate (TESSALON) 200 MG capsule; Take 1 capsule (200 mg total) by mouth 2 (two) times daily as needed for cough.  Dispense: 20 capsule; Refill: 0 - Novel Coronavirus, NAA ([facility]) -Discussed COVID-19 testing, she will go tomorrow to be tested at the [facility].  She is to remain isolated for 14 days after onset of symptoms if test is positive.  She is aware to remain isolated currently until test results received and negative.  If shortness of breath occurs she is to report to the emergency room.  Patient understands instructions.  > 15 minutes spent with patient, > 50% of that time face to face    Howard Pouch, DO 06/16/2019

## 2019-06-17 ENCOUNTER — Other Ambulatory Visit: Payer: Self-pay

## 2019-06-17 DIAGNOSIS — Z20822 Contact with and (suspected) exposure to covid-19: Secondary | ICD-10-CM

## 2019-06-19 ENCOUNTER — Telehealth: Payer: Self-pay | Admitting: Family Medicine

## 2019-06-19 LAB — NOVEL CORONAVIRUS, NAA: SARS-CoV-2, NAA: DETECTED — AB

## 2019-06-19 NOTE — Telephone Encounter (Signed)
Please inform patient the following information: Her Covid test is positive.  She should remain in quarantine for 14 days from her first onset of symptom, as well as symptom-free for 72 hours before ending her quarantine. If she experiences any shortness of breath she is to report to the [facility] emergency room for further evaluation immediately.

## 2019-06-19 NOTE — Telephone Encounter (Signed)
Pt was called and given information, she verbalizes understanding  

## 2019-07-16 DIAGNOSIS — Z79899 Other long term (current) drug therapy: Secondary | ICD-10-CM | POA: Diagnosis not present

## 2019-07-16 DIAGNOSIS — L648 Other androgenic alopecia: Secondary | ICD-10-CM | POA: Diagnosis not present

## 2019-07-21 ENCOUNTER — Other Ambulatory Visit: Payer: Self-pay | Admitting: Obstetrics and Gynecology

## 2019-07-21 DIAGNOSIS — Z1231 Encounter for screening mammogram for malignant neoplasm of breast: Secondary | ICD-10-CM

## 2019-07-28 ENCOUNTER — Other Ambulatory Visit: Payer: Self-pay

## 2019-07-28 ENCOUNTER — Ambulatory Visit
Admission: RE | Admit: 2019-07-28 | Discharge: 2019-07-28 | Disposition: A | Discharge status: Home / Self Care | Payer: BC Managed Care – PPO | Source: Ambulatory Visit

## 2019-07-28 DIAGNOSIS — Z1231 Encounter for screening mammogram for malignant neoplasm of breast: Secondary | ICD-10-CM | POA: Diagnosis not present

## 2019-08-25 ENCOUNTER — Telehealth: Payer: Self-pay | Admitting: Genetic Counselor

## 2019-08-25 NOTE — Telephone Encounter (Signed)
Returned Ms. Drach's call regarding her sister's genetic test results. She will send [place] a copy of the report via email, and notes that the results were negative. We discussed that, in light of these results, no additional genetic testing is recommended at this time. Her cancer screening should be based on the family history. We reviewed that she is at a high risk for breast cancer based on a statistical model ([place]rer-Cuzick) and discussed additional breast screening recommendations.  She has also discovered that her great-great aunt (maternal grandmother's maternal aunt) had ovarian cancer listed as the cause of death on her death certificate. This does not change our recommendation for genetic testing, as this relative is not closely related to Ms. Nault (e.g. first- or second-degree relative).

## 2019-10-01 ENCOUNTER — Other Ambulatory Visit: Payer: Self-pay

## 2019-10-01 MED ORDER — BACLOFEN 10 MG PO TABS: 10.0000 mg | ORAL_TABLET | Freq: Two times a day (BID) | ORAL | 0 refills | Status: DC

## 2019-10-01 NOTE — Telephone Encounter (Signed)
Faxed refill request for pts Baclofen   LOV: 04/2019 Next ov: Not scheduled/Sent my chart message reminding patient to schedule CPE for next month  Last written: 03/30/2019 #180 x1 refill

## 2019-10-03 ENCOUNTER — Ambulatory Visit: Payer: BC Managed Care – PPO | Attending: Internal Medicine

## 2019-10-03 DIAGNOSIS — Z23 Encounter for immunization: Secondary | ICD-10-CM

## 2019-10-03 NOTE — Progress Notes (Signed)
   Covid-19 Vaccination Clinic  Name:  [name]    MRN: [id number] DOB: 1961-07-06  10/03/2019  [name] was observed post Covid-19 immunization for 30 minutes based on pre-vaccination screening without incident. She was provided with Vaccine Information Sheet and instruction to access the V-Safe system.   Ms. Crest was instructed to call 911 with any severe reactions post vaccine: Marland Kitchen Difficulty breathing  . Swelling of face and throat  . A fast heartbeat  . A bad rash all over body  . Dizziness and weakness   Immunizations Administered    Name Date Dose VIS Date Route   Pfizer COVID-19 Vaccine 10/03/2019  1:57 PM 0.3 mL 07/10/2019 Intramuscular   Manufacturer: [place]   Lot: [id number]   [place]: [id number]

## 2019-10-19 ENCOUNTER — Encounter: Payer: Self-pay | Admitting: Family Medicine

## 2019-10-24 ENCOUNTER — Ambulatory Visit: Payer: BC Managed Care – PPO | Attending: Internal Medicine

## 2019-10-24 DIAGNOSIS — Z23 Encounter for immunization: Secondary | ICD-10-CM

## 2019-10-24 NOTE — Progress Notes (Signed)
   Covid-19 Vaccination Clinic  Name:  [name]    MRN: [id number] DOB: 09/16/1960  10/24/2019  [name] was observed post Covid-19 immunization for 15 minutes without incident. She was provided with Vaccine Information Sheet and instruction to access the V-Safe system.   [name] was instructed to call 911 with any severe reactions post vaccine: Marland Kitchen Difficulty breathing  . Swelling of face and throat  . A fast heartbeat  . A bad rash all over body  . Dizziness and weakness   Immunizations Administered    Name Date Dose VIS Date Route   Pfizer COVID-19 Vaccine 10/24/2019 12:44 PM 0.3 mL 07/10/2019 Intramuscular   Manufacturer: [place]   Lot: [id number]   [place]: [id number]

## 2019-10-29 ENCOUNTER — Encounter: Payer: Self-pay | Admitting: Family Medicine

## 2019-10-29 ENCOUNTER — Ambulatory Visit: Payer: BC Managed Care – PPO | Admitting: Family Medicine

## 2019-10-29 ENCOUNTER — Encounter: Payer: Self-pay | Admitting: Gastroenterology

## 2019-10-29 ENCOUNTER — Ambulatory Visit (INDEPENDENT_AMBULATORY_CARE_PROVIDER_SITE_OTHER): Payer: BC Managed Care – PPO | Admitting: Family Medicine

## 2019-10-29 ENCOUNTER — Other Ambulatory Visit: Payer: Self-pay

## 2019-10-29 VITALS — BP 122/78 | HR 70 | Temp 97.2°F | Resp 16 | Ht 64.0 in | Wt 164.0 lb

## 2019-10-29 DIAGNOSIS — Z79899 Other long term (current) drug therapy: Secondary | ICD-10-CM | POA: Diagnosis not present

## 2019-10-29 DIAGNOSIS — Z8249 Family history of ischemic heart disease and other diseases of the circulatory system: Secondary | ICD-10-CM

## 2019-10-29 DIAGNOSIS — E559 Vitamin D deficiency, unspecified: Secondary | ICD-10-CM

## 2019-10-29 DIAGNOSIS — Z0000 Encounter for general adult medical examination without abnormal findings: Secondary | ICD-10-CM

## 2019-10-29 DIAGNOSIS — K219 Gastro-esophageal reflux disease without esophagitis: Secondary | ICD-10-CM

## 2019-10-29 DIAGNOSIS — Z1211 Encounter for screening for malignant neoplasm of colon: Secondary | ICD-10-CM

## 2019-10-29 DIAGNOSIS — M8580 Other specified disorders of bone density and structure, unspecified site: Secondary | ICD-10-CM | POA: Diagnosis not present

## 2019-10-29 DIAGNOSIS — G4700 Insomnia, unspecified: Secondary | ICD-10-CM

## 2019-10-29 DIAGNOSIS — E782 Mixed hyperlipidemia: Secondary | ICD-10-CM | POA: Diagnosis not present

## 2019-10-29 DIAGNOSIS — R7303 Prediabetes: Secondary | ICD-10-CM

## 2019-10-29 LAB — CBC WITH DIFFERENTIAL/PLATELET
Basophils Absolute: 0.1 10*3/uL (ref 0.0–0.1)
Basophils Relative: 1.1 % (ref 0.0–3.0)
Eosinophils Absolute: 0.1 10*3/uL (ref 0.0–0.7)
Eosinophils Relative: 1.7 % (ref 0.0–5.0)
HCT: 42.8 % (ref 36.0–46.0)
Hemoglobin: 14.6 g/dL (ref 12.0–15.0)
Lymphocytes Relative: 32.1 % (ref 12.0–46.0)
Lymphs Abs: 1.9 10*3/uL (ref 0.7–4.0)
MCHC: 34 g/dL (ref 30.0–36.0)
MCV: 94.9 fl (ref 78.0–100.0)
Monocytes Absolute: 0.4 10*3/uL (ref 0.1–1.0)
Monocytes Relative: 7.6 % (ref 3.0–12.0)
Neutro Abs: 3.3 10*3/uL (ref 1.4–7.7)
Neutrophils Relative %: 57.5 % (ref 43.0–77.0)
Platelets: 267 10*3/uL (ref 150.0–400.0)
RBC: 4.51 Mil/uL (ref 3.87–5.11)
RDW: 13 % (ref 11.5–15.5)
WBC: 5.8 10*3/uL (ref 4.0–10.5)

## 2019-10-29 LAB — BASIC METABOLIC PANEL
BUN: 19 mg/dL (ref 6–23)
CO2: 25 mEq/L (ref 19–32)
Calcium: 9.5 mg/dL (ref 8.4–10.5)
Chloride: 105 mEq/L (ref 96–112)
Creatinine, Ser: 0.83 mg/dL (ref 0.40–1.20)
GFR: 70.03 mL/min (ref >60.00)
Glucose, Bld: 101 mg/dL — ABNORMAL HIGH (ref 70–99)
Potassium: 4.2 mEq/L (ref 3.5–5.1)
Sodium: 139 mEq/L (ref 135–145)

## 2019-10-29 LAB — HEMOGLOBIN A1C: Hgb A1c MFr Bld: 5.7 % (ref 4.6–6.5)

## 2019-10-29 MED ORDER — PRAVASTATIN SODIUM 20 MG PO TABS: 20.0000 mg | ORAL_TABLET | Freq: Every day | ORAL | 3 refills | Status: DC

## 2019-10-29 MED ORDER — BACLOFEN 10 MG PO TABS: 10.0000 mg | ORAL_TABLET | Freq: Two times a day (BID) | ORAL | 1 refills | Status: DC

## 2019-10-29 MED ORDER — ZOLPIDEM TARTRATE 5 MG PO TABS: 2.5000 mg | ORAL_TABLET | Freq: Every evening | ORAL | 1 refills | Status: DC | PRN

## 2019-10-29 MED ORDER — PANTOPRAZOLE SODIUM 20 MG PO TBEC: 20.0000 mg | DELAYED_RELEASE_TABLET | Freq: Every day | ORAL | 1 refills | Status: DC

## 2019-10-29 NOTE — Progress Notes (Signed)
This visit occurred during the SARS-CoV-2 public health emergency.  Safety protocols were in place, including screening questions prior to the visit, additional usage of staff PPE, and extensive cleaning of exam room while observing appropriate contact time as indicated for disinfecting solutions.    Patient ID: TABOR RODINE, female  DOB: 09-24-1960, 58 y.o.   MRN: [id number] Patient Care Team    Relationship Specialty Notifications Start End  Ma Hillock, DO PCP - General Family Medicine  03/29/15   Nunzio Cobbs, MD Consulting Physician Obstetrics and Gynecology  [id number]   Clint Guy Counselor Genetic Counselor  10/29/19     Chief Complaint  Patient presents with  . Annual Exam    no pap, needs colonoscopy    Subjective:  CINTHIA SELMER is a 59 y.o.  Female  present for CPE . All past medical history, surgical history, allergies, family history, immunizations, medications and social history were updated in the electronic medical record today. All recent labs, ED visits and hospitalizations within the last year were reviewed.  Health maintenance:  Colonoscopy: completed March 2011 at [place], [place] follow-up.  Due 2021>> referral placed today  Mammogram: completed: 06/2019. [facility]. Cervical cancer screening: Hysterectomy.  Followed by Dr. Quincy Simmonds. Immunizations: tdap UTD 2014, Influenza UTD 2020 (encouraged yearly), Shingrix series completed. Covid series completed  Infectious disease screening: HIV and hep C completed  DEXA: last completed 07/09/2017, result -1.1 osteopenic, follow up 10 yrs. followed by Dr. Quincy Simmonds Assistive device: none Oxygen [id number] Patient has a Dental home. Hospitalizations/ED visits: reviewed  Insomnia/neck pain Patient reports insomnia iswell controlledon Ambien 5 mg daily at bedtime. She has been on this medication since ~2012. She reports she routinely needs this medication- sometimes she takes  a half tab.. She deniesnegative side effects. She reports she has continued to use the baclofen BID for neck pain and feels it has been helpful.   Mixed hyperlipidemia/On statin therapy/Family history of premature coronary artery disease Tolerating.  statin and fish oil 1000 mg QD.   She is tryin gto get back to her routine of walking. Complaint with   pravastatin 20 mg a day. Last lipid panel 04/2019 WNL  Prediabetes HgB A1c 6.1>> 5.8> 5.8 last check. Exercising daily again.    Depression screen [facility] 2/9 05/04/2019 05/22/2018 10/30/2017 05/17/2017  Decreased Interest 0 0 0 0  Down, Depressed, Hopeless - 0 0 0  PHQ - 2 Score 0 0 0 0   No flowsheet data found.  Immunization History  Administered Date(s) Administered  . Influenza Inj Mdck Quad Pf 04/18/2018  . Influenza, Quadrivalent, Recombinant, Inj, Pf 04/30/2019  . Influenza,inj,Quad PF,6+ Mos 05/17/2017  . Influenza-Unspecified 06/20/2015, 05/25/2016, 04/30/2019  . PFIZER SARS-COV-2 Vaccination 10/03/2019, 10/24/2019  . PPD Test 03/16/2014  . Tdap 04/03/2013  . Zoster Recombinat (Shingrix) 10/30/2017, 01/27/2018   Past Medical History:  Diagnosis Date  . Arthritis    neck  . COVID-19 virus infection 05/2019  . Diverticulosis of colon   . Elevated cholesterol with elevated triglycerides   . Eustachian tube dysfunction, left 05/22/2018  . Family history of breast cancer   . Family history of cervical cancer   . History of rectocele   . Insomnia    started ambien 5 ya.  . Osteopenia    --left femur  . Periodic heart flutter   . SVD (spontaneous vaginal delivery)    x 2  . Urinary incontinence    Allergies  Allergen  Reactions  . Amoxicillin Hives  . Sulfa Antibiotics Rash    Childhood rxn   Past Surgical History:  Procedure Laterality Date  . BLADDER SUSPENSION N/A 11/10/2013   Procedure: TRANSVAGINAL TAPE (TVT) PROCEDURE;  Surgeon: Jamey Reas de Berton Lan, MD;  Location: [facility];  Service:  Gynecology;  Laterality: N/A;  . COLONOSCOPY    . CYSTOSCOPY N/A 11/10/2013   Procedure: CYSTOSCOPY;  Surgeon: Jamey Reas de Berton Lan, MD;  Location: [facility];  Service: Gynecology;  Laterality: N/A;  . IRRIGATION AND DEBRIDEMENT SEBACEOUS CYST  03/2017  . VAGINAL HYSTERECTOMY  11/11    Vag cuff, mild rectocele  . WISDOM TOOTH EXTRACTION     Family History  Problem Relation Age of Onset  . Arthritis Mother   . Hypertension Mother   . Hyperlipidemia Mother   . Heart disease Mother 58       dec  . Hypertension Father   . Hyperlipidemia Father   . Heart disease Father 40       smoked for 10 yrs., no muscle damage, 1 MI   . Pulmonary fibrosis Father        diagnosed end stage, cause unkn.  Marland Kitchen Urolithiasis Father   . Hyperlipidemia Brother   . Urolithiasis Brother   . Diabetes Son   . Asthma Son   . Seizures Son   . Arthritis Maternal Grandmother   . Cervical cancer Maternal Grandmother        diagnosed in her 2s or 10s  . Arthritis Paternal Grandmother   . Asthma Son   . Diverticulosis Sister   . Breast cancer Sister 56   Social History   Social History Narrative   Relocated to [place] from [place]. Due to husband job transfer. 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 She lives with her husband. She has 2 grown sons- oldest recently graduated from college and took a job in [place].; youngest just graduated HS & ia attending [place].      Has had regular preventive care in [place]. At [facility] w/Dr. Alfonse Spruce. Only health concern is insomnia that developed about 5 years ago & has been treated with [place]. She has not practiced sleep hygiene.      Occasionally drinks sweet tea.     Allergies as of 10/29/2019      Reactions   Amoxicillin Hives   Sulfa Antibiotics Rash   Childhood rxn      Medication List       Accurate as of October 29, 2019  9:02 AM. If you have any questions, ask your  nurse or doctor.        STOP taking these medications   benzonatate 200 MG capsule Commonly known as: TESSALON Stopped by: Howard Pouch, DO   Biotin 10000 MCG Tabs Stopped by: Howard Pouch, DO   [employer] SPECTRAVITE ADVANCED PO Stopped by: Howard Pouch, DO   Nu-Iron 150 MG capsule Generic drug: iron polysaccharides Stopped by: Howard Pouch, DO     TAKE these medications   baclofen 10 MG tablet Commonly known as: LIORESAL Take 1 tablet (10 mg total) by mouth 2 (two) times daily.   Caltrate 600+D Plus Minerals 600-800 MG-UNIT Tabs Take by mouth. What changed: Another medication with the same name was removed. Continue taking this medication, and follow the directions you see here. Changed by: Howard Pouch, DO  Fish Oil 1000 MG Caps Take 1 capsule by mouth. 1200mg    pantoprazole 20 MG tablet Commonly known as: PROTONIX Take 1 tablet (20 mg total) by mouth daily. Needs office visit for further refills.   pravastatin 20 MG tablet Commonly known as: PRAVACHOL Take 1 tablet (20 mg total) by mouth daily.   Spironolactone 25 MG/5ML Susp Take 1 tablet by mouth 2 (two) times daily.   Vitamin D 50 MCG (2000 UT) Caps Take by mouth in the morning and at bedtime.   zolpidem 5 MG tablet Commonly known as: AMBIEN Take 0.5-1 tablets (2.5-5 mg total) by mouth at bedtime as needed for sleep.       All past medical history, surgical history, allergies, family history, immunizations andmedications were updated in the EMR today and reviewed under the history and medication portions of their EMR.     No results found for this or any previous visit (from the past 2160 hour(s)).  MM 3D SCREEN BREAST BILATERAL  Result Date: 07/28/2019 CLINICAL DATA:  Screening. EXAM: DIGITAL SCREENING BILATERAL MAMMOGRAM WITH TOMO AND CAD COMPARISON:  Previous exam(s). ACR Breast Density Category b: There are scattered areas of fibroglandular density. FINDINGS: There are no findings suspicious for  malignancy. Images were processed with CAD. IMPRESSION: No mammographic evidence of malignancy. A result letter of this screening mammogram will be mailed directly to the patient. RECOMMENDATION: Screening mammogram in one year. (Code:SM-B-01Y) BI-RADS CATEGORY  1: Negative. Electronically Signed   By: Nolon Nations M.D.   On: 07/28/2019 08:21     ROS: 14 pt review of systems performed and negative (unless mentioned in an HPI)  Objective: BP 122/78 (BP Location: Right Arm, Patient Position: Sitting, Cuff Size: Normal)   Pulse 70   Temp (!) 97.2 F (36.2 C) (Temporal)   Resp 16   Ht 5\' 4"  (1.626 m)   Wt 164 lb (74.4 kg)   LMP 07/30/2009 (Within Years)   SpO2 97%   BMI 28.15 kg/m  Gen: Afebrile. No acute distress. Nontoxic in appearance, well-developed, well-nourished,  Female.  HENT: AT. [place]. Bilateral TM visualized and normal in appearance, normal external auditory canal. MMM, no oral lesions, adequate dentition. Bilateral nares within normal limits. Throat without erythema, ulcerations or exudates. no Cough on exam, no hoarseness on exam. Eyes:Pupils Equal Round Reactive to light, Extraocular movements intact,  Conjunctiva without redness, discharge or icterus. Neck/lymp/endocrine: Supple,no lymphadenopathy, no thyromegaly CV: RRR no murmur, no edema, +2/4 P posterior tibialis pulses.  Chest: CTAB, no wheeze, rhonchi or crackles. normal Respiratory effort. good Air movement. Abd: Soft. flat. NTND. BS present. no Masses palpated. No hepatosplenomegaly. No rebound tenderness or guarding. Skin: no rashes, purpura or petechiae. Warm and well-perfused. Skin intact. Neuro/Msk:  Normal gait. PERLA. EOMi. Alert. Oriented x3.  Cranial nerves II through XII intact. Muscle strength 5/5 upper/lower extremity. DTRs equal bilaterally. Psych: Normal affect, dress and demeanor. Normal speech. Normal thought content and judgment.   No exam data present  Assessment/plan: WILLOH FOSKETT is a 59  y.o. female present for CPE  Gastroesophageal reflux disease without esophagitis  stable. Continue protonix.   Colon cancer screening - Ambulatory referral to Gastroenterology  Encounter for preventive health examination Patient was encouraged to exercise greater than 150 minutes a week. Patient was encouraged to choose a diet filled with fresh fruits and vegetables, and lean meats. AVS provided to patient today for education/recommendation on gender specific health and safety maintenance. Colonoscopy: Due.  referral placed today  Mammogram: completed:  06/2019. [facility] orders are per Dr. Quincy Simmonds.  Cervical cancer screening: Hysterectomy.  Followed by Dr. Quincy Simmonds. Immunizations: all immunizations UTD for age- including covid Infectious disease screening: HIV and hep C completed  DEXA: up to date- rpt 2028   Insomnia, unspecified type -Stable.  - continue  ambien 5 mg QHS today.  - [facility] database reviewed04/01/21  - Contract signed.  - F/U 6 monthor  when needing refills.   Mixed hyperlipidemia/On statin therapy/Family history of premature coronary artery disease - stableShe has made great dietary and exercise changes.  - continue  pravastatin. - continue fish oil 1000 mg QD - labs UTD   Prediabetes - last A1c 6.1--> 5.8-->5.8>> collected today  Vitamin D deficiency/Osteopenia - taking her daily supplement. Recent lab normal.  Bone density UTD through GYN   Return in about 25 weeks (around 04/21/2020) for [facility] (30 min). and 1 yr for CPE  Orders Placed This Encounter  Procedures  . CBC w/Diff  . Basic Metabolic Panel (BMET)  . Hemoglobin A1c  . Ambulatory referral to Gastroenterology    Meds ordered this encounter  Medications  . pantoprazole (PROTONIX) 20 MG tablet    Sig: Take 1 tablet (20 mg total) by mouth daily. Needs office visit for further refills.    Dispense:  90 tablet    Refill:  1  . pravastatin (PRAVACHOL) 20 MG  tablet    Sig: Take 1 tablet (20 mg total) by mouth daily.    Dispense:  90 tablet    Refill:  3  . baclofen (LIORESAL) 10 MG tablet    Sig: Take 1 tablet (10 mg total) by mouth 2 (two) times daily.    Dispense:  180 each    Refill:  1  . zolpidem (AMBIEN) 5 MG tablet    Sig: Take 0.5-1 tablets (2.5-5 mg total) by mouth at bedtime as needed for sleep.    Dispense:  90 tablet    Refill:  1    Referral Orders     Ambulatory referral to Gastroenterology   Electronically signed by: Howard Pouch, DO [place]

## 2019-10-29 NOTE — Patient Instructions (Signed)
Health Maintenance, Female Adopting a healthy lifestyle and getting preventive care are important in promoting health and wellness. Ask your health care provider about:  The right schedule for you to have regular tests and exams.  Things you can do on your own to prevent diseases and keep yourself healthy. What should I know about diet, weight, and exercise? Eat a healthy diet   Eat a diet that includes plenty of vegetables, fruits, low-fat dairy products, and lean protein.  Do not eat a lot of foods that are high in solid fats, added sugars, or sodium. Maintain a healthy weight Body mass index (BMI) is used to identify weight problems. It estimates body fat based on height and weight. Your health care provider can help determine your BMI and help you achieve or maintain a healthy weight. Get regular exercise Get regular exercise. This is one of the most important things you can do for your health. Most adults should:  Exercise for at least 150 minutes each week. The exercise should increase your heart rate and make you sweat (moderate-intensity exercise).  Do strengthening exercises at least twice a week. This is in addition to the moderate-intensity exercise.  Spend less time sitting. Even light physical activity can be beneficial. Watch cholesterol and blood lipids Have your blood tested for lipids and cholesterol at 61 years of age, then have this test every 5 years. Have your cholesterol levels checked more often if:  Your lipid or cholesterol levels are high.  You are older than 61 years of age.  You are at high risk for heart disease. What should I know about cancer screening? Depending on your health history and family history, you may need to have cancer screening at various ages. This may include screening for:  Breast cancer.  Cervical cancer.  Colorectal cancer.  Skin cancer.  Lung cancer. What should I know about heart disease, diabetes, and high blood  pressure? Blood pressure and heart disease  High blood pressure causes heart disease and increases the risk of stroke. This is more likely to develop in people who have high blood pressure readings, are of African descent, or are overweight.  Have your blood pressure checked: ? Every 3-5 years if you are 18-39 years of age. ? Every year if you are 40 years old or older. Diabetes Have regular diabetes screenings. This checks your fasting blood sugar level. Have the screening done:  Once every three years after age 40 if you are at a normal weight and have a low risk for diabetes.  More often and at a younger age if you are overweight or have a high risk for diabetes. What should I know about preventing infection? Hepatitis B If you have a higher risk for hepatitis B, you should be screened for this virus. Talk with your health care provider to find out if you are at risk for hepatitis B infection. Hepatitis C Testing is recommended for:  Everyone born from 1945 through 1965.  Anyone with known risk factors for hepatitis C. Sexually transmitted infections (STIs)  Get screened for STIs, including gonorrhea and chlamydia, if: ? You are sexually active and are younger than 61 years of age. ? You are older than 61 years of age and your health care provider tells you that you are at risk for this type of infection. ? Your sexual activity has changed since you were last screened, and you are at increased risk for chlamydia or gonorrhea. Ask your health care provider if   you are at risk.  Ask your health care provider about whether you are at high risk for HIV. Your health care provider may recommend a prescription medicine to help prevent HIV infection. If you choose to take medicine to prevent HIV, you should first get tested for HIV. You should then be tested every 3 months for as long as you are taking the medicine. Pregnancy  If you are about to stop having your period (premenopausal) and  you may become pregnant, seek counseling before you get pregnant.  Take 400 to 800 micrograms (mcg) of folic acid every day if you become pregnant.  Ask for birth control (contraception) if you want to prevent pregnancy. Osteoporosis and menopause Osteoporosis is a disease in which the bones lose minerals and strength with aging. This can result in bone fractures. If you are 65 years old or older, or if you are at risk for osteoporosis and fractures, ask your health care provider if you should:  Be screened for bone loss.  Take a calcium or vitamin D supplement to lower your risk of fractures.  Be given hormone replacement therapy (HRT) to treat symptoms of menopause. Follow these instructions at home: Lifestyle  Do not use any products that contain nicotine or tobacco, such as cigarettes, e-cigarettes, and chewing tobacco. If you need help quitting, ask your health care provider.  Do not use street drugs.  Do not share needles.  Ask your health care provider for help if you need support or information about quitting drugs. Alcohol use  Do not drink alcohol if: ? Your health care provider tells you not to drink. ? You are pregnant, may be pregnant, or are planning to become pregnant.  If you drink alcohol: ? Limit how much you use to 0-1 drink a day. ? Limit intake if you are breastfeeding.  Be aware of how much alcohol is in your drink. In the [place] one drink equals one 12 oz bottle of beer (355 mL), one 5 oz glass of wine (148 mL), or one 1 oz glass of hard liquor (44 mL). General instructions  Schedule regular health, dental, and eye exams.  Stay current with your vaccines.  Tell your health care provider if: ? You often feel depressed. ? You have ever been abused or do not feel safe at home. Summary  Adopting a healthy lifestyle and getting preventive care are important in promoting health and wellness.  Follow your health care provider's instructions about healthy  diet, exercising, and getting tested or screened for diseases.  Follow your health care provider's instructions on monitoring your cholesterol and blood pressure. This information is not intended to replace advice given to you by your health care provider. Make sure you discuss any questions you have with your health care provider. Document Revised: 07/09/2018 Document Reviewed: 07/09/2018 Elsevier Patient Education  2020 Elsevier Inc.  

## 2019-11-03 ENCOUNTER — Ambulatory Visit: Payer: BC Managed Care – PPO

## 2019-11-09 ENCOUNTER — Encounter: Payer: Self-pay | Admitting: Family Medicine

## 2019-11-19 ENCOUNTER — Other Ambulatory Visit: Payer: Self-pay

## 2019-11-19 ENCOUNTER — Ambulatory Visit (AMBULATORY_SURGERY_CENTER): Payer: Self-pay | Admitting: *Deleted

## 2019-11-19 VITALS — Temp 97.1°F | Ht 64.0 in | Wt 162.0 lb

## 2019-11-19 DIAGNOSIS — Z1211 Encounter for screening for malignant neoplasm of colon: Secondary | ICD-10-CM

## 2019-11-19 MED ORDER — SUPREP BOWEL PREP KIT 17.5-3.13-1.6 GM/177ML PO SOLN: 1.0000 | Freq: Once | ORAL | 0 refills | Status: AC

## 2019-11-19 NOTE — Progress Notes (Signed)
10-24-2019 COMPLETED COVID VACCINES   No egg or soy allergy known to patient  No issues with past sedation with any surgeries  or procedures, no intubation problems  No diet pills per patient No home 02 use per patient  No blood thinners per patient  Pt denies issues with constipation  No A fib or A flutter  EMMI video sent to pt's e mail   Due to the COVID-19 pandemic we are asking patients to follow these guidelines. Please only bring one care partner. Please be aware that your care partner may wait in the car in the parking lot or if they feel like they will be too hot to wait in the car, they may wait in the lobby on the 4th floor. All care partners are required to wear a mask the entire time (we do not have any that we can provide them), they need to practice social distancing, and we will do a Covid check for all patient's and care partners when you arrive. Also we will check their temperature and your temperature. If the care partner waits in their car they need to stay in the parking lot the entire time and we will call them on their cell phone when the patient is ready for discharge so they can bring the car to the front of the building. Also all patient's will need to wear a mask into building.

## 2019-11-30 ENCOUNTER — Encounter: Payer: Self-pay | Admitting: Gastroenterology

## 2019-12-03 ENCOUNTER — Other Ambulatory Visit: Payer: Self-pay

## 2019-12-03 ENCOUNTER — Encounter: Payer: Self-pay | Admitting: Gastroenterology

## 2019-12-03 ENCOUNTER — Ambulatory Visit (AMBULATORY_SURGERY_CENTER): Payer: BC Managed Care – PPO | Admitting: Gastroenterology

## 2019-12-03 VITALS — BP 120/72 | HR 62 | Temp 97.5°F | Resp 20 | Ht 64.0 in | Wt 162.0 lb

## 2019-12-03 DIAGNOSIS — K621 Rectal polyp: Secondary | ICD-10-CM | POA: Diagnosis not present

## 2019-12-03 DIAGNOSIS — K5730 Diverticulosis of large intestine without perforation or abscess without bleeding: Secondary | ICD-10-CM

## 2019-12-03 DIAGNOSIS — Z1211 Encounter for screening for malignant neoplasm of colon: Secondary | ICD-10-CM | POA: Diagnosis not present

## 2019-12-03 DIAGNOSIS — D128 Benign neoplasm of rectum: Secondary | ICD-10-CM

## 2019-12-03 MED ORDER — SODIUM CHLORIDE 0.9 % IV SOLN: 500.0000 mL | Freq: Once | INTRAVENOUS | Status: DC

## 2019-12-03 NOTE — Op Note (Signed)
[facility] Patient Name: [name] Procedure Date: 12/03/2019 9:08 AM MRN: [id number] Endoscopist: Mauri Pole , MD Age: 60 Referring MD:  Date of Birth: 1961/03/17 Gender: Female Account #: [id number] Procedure:                Colonoscopy Indications:              Screening for colorectal malignant neoplasm Medicines:                Monitored Anesthesia Care Procedure:                Pre-Anesthesia Assessment:                           - Prior to the procedure, a History and Physical                            was performed, and patient medications and                            allergies were reviewed. The patient's tolerance of                            previous anesthesia was also reviewed. The risks                            and benefits of the procedure and the sedation                            options and risks were discussed with the patient.                            All questions were answered, and informed consent                            was obtained. Prior Anticoagulants: The patient has                            taken no previous anticoagulant or antiplatelet                            agents. ASA Grade Assessment: II - A patient with                            mild systemic disease. After reviewing the risks                            and benefits, the patient was deemed in                            satisfactory condition to undergo the procedure.                           After obtaining informed consent, the colonoscope  was passed under direct vision. Throughout the                            procedure, the patient's blood pressure, pulse, and                            oxygen saturations were monitored continuously. The                            Colonoscope was introduced through the anus and                            advanced to the the terminal ileum, with                            identification of the  appendiceal orifice and IC                            valve. The colonoscopy was performed without                            difficulty. The patient tolerated the procedure                            well. The quality of the bowel preparation was                            excellent. The ileocecal valve, appendiceal                            orifice, and rectum were photographed. Scope In: 9:19:21 AM Scope Out: 9:31:57 AM Scope Withdrawal Time: 0 hours 8 minutes 56 seconds  Total Procedure Duration: 0 hours 12 minutes 36 seconds  Findings:                 The perianal and digital rectal examinations were                            normal.                           Multiple small and large-mouthed diverticula were                            found in the sigmoid colon, descending colon,                            transverse colon, ascending colon and cecum.                           Two sessile polyps were found in the rectum. The                            polyps were 1 to 2 mm in size. These polyps were  removed with a cold biopsy forceps. Resection and                            retrieval were complete.                           Non-bleeding internal hemorrhoids were found during                            retroflexion. The hemorrhoids were small.                           The exam was otherwise without abnormality. Complications:            No immediate complications. Estimated Blood Loss:     Estimated blood loss was minimal. Impression:               - Moderate diverticulosis in the sigmoid colon, in                            the descending colon, in the transverse colon, in                            the ascending colon and in the cecum.                           - Two 1 to 2 mm polyps in the rectum, removed with                            a cold biopsy forceps. Resected and retrieved.                           - Non-bleeding internal hemorrhoids.                            - The examination was otherwise normal. Recommendation:           - Patient has a contact number available for                            emergencies. The signs and symptoms of potential                            delayed complications were discussed with the                            patient. Return to normal activities tomorrow.                            Written discharge instructions were provided to the                            patient.                           - Resume previous diet.                           -  Continue present medications.                           - Await pathology results.                           - Repeat colonoscopy in 5-10 years for surveillance                            based on pathology results. Mauri Pole, MD 12/03/2019 9:35:48 AM This report has been signed electronically.

## 2019-12-03 NOTE — Progress Notes (Signed)
Temp JB VS DT  Pt's states no medical or surgical changes since previsit or office visit. 

## 2019-12-03 NOTE — Progress Notes (Signed)
Called to room to assist during endoscopic procedure.  Patient ID and intended procedure confirmed with present staff. Received instructions for my participation in the procedure from the performing physician.  

## 2019-12-03 NOTE — Patient Instructions (Signed)
Please read handouts provided. Continue present medications. Await pathology results.   YOU HAD AN ENDOSCOPIC PROCEDURE TODAY AT THE [facility]:   Refer to the procedure report that was given to you for any specific questions about what was found during the examination.  If the procedure report does not answer your questions, please call your gastroenterologist to clarify.  If you requested that your care partner not be given the details of your procedure findings, then the procedure report has been included in a sealed envelope for you to review at your convenience later.  YOU SHOULD EXPECT: Some feelings of bloating in the abdomen. Passage of more gas than usual.  Walking can help get rid of the air that was put into your GI tract during the procedure and reduce the bloating. If you had a lower endoscopy (such as a colonoscopy or flexible sigmoidoscopy) you may notice spotting of blood in your stool or on the toilet paper. If you underwent a bowel prep for your procedure, you may not have a normal bowel movement for a few days.  Please Note:  You might notice some irritation and congestion in your nose or some drainage.  This is from the oxygen used during your procedure.  There is no need for concern and it should clear up in a day or so.  SYMPTOMS TO REPORT IMMEDIATELY:  Following lower endoscopy (colonoscopy or flexible sigmoidoscopy):  Excessive amounts of blood in the stool  Significant tenderness or worsening of abdominal pains  Swelling of the abdomen that is new, acute  Fever of 100F or higher   For urgent or emergent issues, a gastroenterologist can be reached at any hour by calling [phone]. Do not use MyChart messaging for urgent concerns.    DIET:  We do recommend a small meal at first, but then you may proceed to your regular diet.  Drink plenty of fluids but you should avoid alcoholic beverages for 24 hours.  ACTIVITY:  You should plan to take it easy  for the rest of today and you should NOT DRIVE or use heavy machinery until tomorrow (because of the sedation medicines used during the test).    FOLLOW UP: Our staff will call the number listed on your records 48-72 hours following your procedure to check on you and address any questions or concerns that you may have regarding the information given to you following your procedure. If we do not reach you, we will leave a message.  We will attempt to reach you two times.  During this call, we will ask if you have developed any symptoms of COVID 19. If you develop any symptoms (ie: fever, flu-like symptoms, shortness of breath, cough etc.) before then, please call [phone].  If you test positive for Covid 19 in the 2 weeks post procedure, please call and report this information to us.    If any biopsies were taken you will be contacted by phone or by letter within the next 1-3 weeks.  Please call us at [phone] if you have not heard about the biopsies in 3 weeks.    SIGNATURES/CONFIDENTIALITY: You and/or your care partner have signed paperwork which will be entered into your electronic medical record.  These signatures attest to the fact that that the information above on your After Visit Summary has been reviewed and is understood.  Full responsibility of the confidentiality of this discharge information lies with you and/or your care-partner.  

## 2019-12-07 ENCOUNTER — Telehealth: Payer: Self-pay

## 2019-12-07 NOTE — Telephone Encounter (Signed)
  Follow up Call-  Call back number 12/03/2019  Post procedure Call Back phone  # [phone]  Permission to leave phone message Yes  Some recent data might be hidden     Patient questions:  Do you have a fever, pain , or abdominal swelling? No. Pain Score  0 *  Have you tolerated food without any problems? Yes.    Have you been able to return to your normal activities? Yes.    Do you have any questions about your discharge instructions: Diet   No. Medications  No. Follow up visit  No.  Do you have questions or concerns about your Care? No.  Actions: * If pain score is 4 or above: No action needed, pain <4.  1. Have you developed a fever since your procedure? no  2.   Have you had an respiratory symptoms (SOB or cough) since your procedure? no  3.   Have you tested positive for COVID 19 since your procedure no  4.   Have you had any family members/close contacts diagnosed with the COVID 19 since your procedure?  no   If yes to any of these questions please route to Joylene John, RN and Erenest Rasher, RN

## 2019-12-16 ENCOUNTER — Telehealth: Payer: Self-pay | Admitting: Family Medicine

## 2019-12-16 NOTE — Telephone Encounter (Signed)
Patient called in on May 17 with a concern in regards to her bill for her April 1 visit. Ask that coders review the charges to see if it's supported the additional office visit being billed. It was sent for review and I contacted patient today (12/15/19)to let her know that based on chronic conditions being discussed, contract being signed and medication refilled that the additional visit was supported.

## 2019-12-17 ENCOUNTER — Encounter: Payer: Self-pay | Admitting: Gastroenterology

## 2020-01-14 DIAGNOSIS — L649 Androgenic alopecia, unspecified: Secondary | ICD-10-CM | POA: Diagnosis not present

## 2020-02-09 DIAGNOSIS — D2262 Melanocytic nevi of left upper limb, including shoulder: Secondary | ICD-10-CM | POA: Diagnosis not present

## 2020-02-09 DIAGNOSIS — D485 Neoplasm of uncertain behavior of skin: Secondary | ICD-10-CM | POA: Diagnosis not present

## 2020-02-09 DIAGNOSIS — D1801 Hemangioma of skin and subcutaneous tissue: Secondary | ICD-10-CM | POA: Diagnosis not present

## 2020-02-09 DIAGNOSIS — L821 Other seborrheic keratosis: Secondary | ICD-10-CM | POA: Diagnosis not present

## 2020-02-09 DIAGNOSIS — D2261 Melanocytic nevi of right upper limb, including shoulder: Secondary | ICD-10-CM | POA: Diagnosis not present

## 2020-02-09 DIAGNOSIS — L814 Other melanin hyperpigmentation: Secondary | ICD-10-CM | POA: Diagnosis not present

## 2020-04-13 ENCOUNTER — Encounter: Payer: Self-pay | Admitting: Family Medicine

## 2020-04-13 ENCOUNTER — Ambulatory Visit (INDEPENDENT_AMBULATORY_CARE_PROVIDER_SITE_OTHER): Payer: BC Managed Care – PPO | Admitting: Family Medicine

## 2020-04-13 ENCOUNTER — Other Ambulatory Visit: Payer: Self-pay

## 2020-04-13 VITALS — BP 114/80 | HR 68 | Temp 98.5°F | Resp 16 | Ht 64.0 in | Wt 165.6 lb

## 2020-04-13 DIAGNOSIS — Z79899 Other long term (current) drug therapy: Secondary | ICD-10-CM | POA: Diagnosis not present

## 2020-04-13 DIAGNOSIS — E611 Iron deficiency: Secondary | ICD-10-CM | POA: Diagnosis not present

## 2020-04-13 DIAGNOSIS — E782 Mixed hyperlipidemia: Secondary | ICD-10-CM | POA: Diagnosis not present

## 2020-04-13 DIAGNOSIS — R7303 Prediabetes: Secondary | ICD-10-CM

## 2020-04-13 DIAGNOSIS — G4700 Insomnia, unspecified: Secondary | ICD-10-CM | POA: Diagnosis not present

## 2020-04-13 DIAGNOSIS — M8580 Other specified disorders of bone density and structure, unspecified site: Secondary | ICD-10-CM

## 2020-04-13 DIAGNOSIS — E559 Vitamin D deficiency, unspecified: Secondary | ICD-10-CM

## 2020-04-13 DIAGNOSIS — Z23 Encounter for immunization: Secondary | ICD-10-CM

## 2020-04-13 DIAGNOSIS — K219 Gastro-esophageal reflux disease without esophagitis: Secondary | ICD-10-CM

## 2020-04-13 LAB — LDL CHOLESTEROL, DIRECT: Direct LDL: 114 mg/dL

## 2020-04-13 LAB — COMPREHENSIVE METABOLIC PANEL
ALT: 22 U/L (ref 0–35)
AST: 16 U/L (ref 0–37)
Albumin: 4.8 g/dL (ref 3.5–5.2)
Alkaline Phosphatase: 54 U/L (ref 39–117)
BUN: 18 mg/dL (ref 6–23)
CO2: 25 mEq/L (ref 19–32)
Calcium: 10.2 mg/dL (ref 8.4–10.5)
Chloride: 101 mEq/L (ref 96–112)
Creatinine, Ser: 0.94 mg/dL (ref 0.40–1.20)
GFR: 60.56 mL/min (ref >60.00)
Glucose, Bld: 106 mg/dL — ABNORMAL HIGH (ref 70–99)
Potassium: 4.4 mEq/L (ref 3.5–5.1)
Sodium: 137 mEq/L (ref 135–145)
Total Bilirubin: 0.6 mg/dL (ref 0.2–1.2)
Total Protein: 7.3 g/dL (ref 6.0–8.3)

## 2020-04-13 LAB — LIPID PANEL
Cholesterol: 193 mg/dL (ref 0–200)
HDL: 44.4 mg/dL (ref >39.00)
NonHDL: 148.37
Total CHOL/HDL Ratio: 4
Triglycerides: 234 mg/dL — ABNORMAL HIGH (ref 0.0–149.0)
VLDL: 46.8 mg/dL — ABNORMAL HIGH (ref 0.0–40.0)

## 2020-04-13 LAB — TSH: TSH: 3.8 u[IU]/mL (ref 0.35–4.50)

## 2020-04-13 MED ORDER — ZOLPIDEM TARTRATE 5 MG PO TABS: 2.5000 mg | ORAL_TABLET | Freq: Every evening | ORAL | 1 refills | Status: DC | PRN

## 2020-04-13 MED ORDER — BACLOFEN 10 MG PO TABS: 10.0000 mg | ORAL_TABLET | Freq: Two times a day (BID) | ORAL | 1 refills | Status: DC

## 2020-04-13 MED ORDER — PANTOPRAZOLE SODIUM 20 MG PO TBEC: 20.0000 mg | DELAYED_RELEASE_TABLET | Freq: Every day | ORAL | 1 refills | Status: DC

## 2020-04-13 NOTE — Addendum Note (Signed)
Addended by: Deveron Furlong D on: 04/13/2020 08:49 AM   Modules accepted: Orders

## 2020-04-13 NOTE — Patient Instructions (Addendum)
Great to see you today.  We will call you with lab results.  I have refilled your medications

## 2020-04-13 NOTE — Progress Notes (Signed)
This visit occurred during the SARS-CoV-2 public health emergency.  Safety protocols were in place, including screening questions prior to the visit, additional usage of staff PPE, and extensive cleaning of exam room while observing appropriate contact time as indicated for disinfecting solutions.    Patient ID: [name], female  DOB: 15-Sep-1960, 59 y.o.   MRN: [id number] Patient Care Team    Relationship Specialty Notifications Start End  Ma Hillock, DO PCP - General Family Medicine  03/29/15   Nunzio Cobbs, MD Consulting Physician Obstetrics and Gynecology  01/29/70   Clint Guy Counselor Genetic Counselor  10/29/19   Mauri Pole, MD Consulting Physician Gastroenterology  04/13/20     Chief Complaint  Patient presents with  . Follow-up    [facility], pt is fasting    Subjective: [name] is a 59 y.o.  Female  present for  Insomnia/neck pain Patient reports insomnia is well controlledon Ambien 5 mg daily at bedtime. She has been on this medication since ~2012. She reports she routinely needs this medication- sometimes she takes a half tab.. She deniesnegative side effects. She reports she has continued to use the baclofen BID for neck pain and feels it has been helpful.   Mixed hyperlipidemia/On statin therapy/Family history of premature coronary artery disease Tolerating statin and fish oil 1000 mg QD.   Compliant w/ pravastatin 20 mg a day. Last lipid panel 04/2019 [facility]. Pt is fasting.  Prediabetes HgB A1c 6.1>> 5.8> 5.8> 5.7 last check. Exercising daily again.    Depression screen [facility] 2/9 05/04/2019 05/22/2018 10/30/2017 05/17/2017  Decreased Interest 0 0 0 0  Down, Depressed, Hopeless - 0 0 0  PHQ - 2 Score 0 0 0 0   No flowsheet data found.  Immunization History  Administered Date(s) Administered  . Influenza Inj Mdck Quad Pf 04/18/2018  . Influenza, Quadrivalent, Recombinant, Inj, Pf 04/30/2019  . Influenza,inj,Quad PF,6+ Mos  05/17/2017  . Influenza-Unspecified 06/20/2015, 05/25/2016, 04/30/2019  . PFIZER SARS-COV-2 Vaccination 10/03/2019, 10/24/2019  . PPD Test 03/16/2014  . Tdap 04/03/2013  . Zoster Recombinat (Shingrix) 10/30/2017, 01/27/2018   Past Medical History:  Diagnosis Date  . Anemia    PAST HX- ON IRON   . Arthritis    neck  . Cataract    BEGINNING   . COVID-19 virus infection 05/2019  . Diverticulosis of colon    MILD  . Elevated cholesterol with elevated triglycerides   . Eustachian tube dysfunction, left 05/22/2018  . Family history of breast cancer   . Family history of cervical cancer   . GERD (gastroesophageal reflux disease)   . History of rectocele   . Insomnia    started ambien 5 ya.  . Osteopenia    --left femur  . Periodic heart flutter   . SVD (spontaneous vaginal delivery)    x 2  . Urinary incontinence    Allergies  Allergen Reactions  . Amoxicillin Hives  . Sulfa Antibiotics Rash    Childhood rxn   Past Surgical History:  Procedure Laterality Date  . BLADDER SUSPENSION N/A 11/10/2013   Procedure: TRANSVAGINAL TAPE (TVT) PROCEDURE;  Surgeon: Jamey Reas de Berton Lan, MD;  Location: [facility];  Service: Gynecology;  Laterality: N/A;  . COLONOSCOPY  2011   [place] [facility]   . CYSTOSCOPY N/A 11/10/2013   Procedure: CYSTOSCOPY;  Surgeon: Jamey Reas de Berton Lan, MD;  Location: [facility];  Service: Gynecology;  Laterality: N/A;  .  IRRIGATION AND DEBRIDEMENT SEBACEOUS CYST  03/2017  . VAGINAL HYSTERECTOMY  11/11    Vag cuff, mild rectocele  . WISDOM TOOTH EXTRACTION     Family History  Problem Relation Age of Onset  . Arthritis Mother   . Hypertension Mother   . Hyperlipidemia Mother   . Heart disease Mother 56       dec  . Hypertension Father   . Hyperlipidemia Father   . Heart disease Father 37       smoked for 10 yrs., no muscle damage, 1 MI   . Pulmonary fibrosis Father        diagnosed end stage, cause unkn.  Marland Kitchen Urolithiasis  Father   . Hyperlipidemia Brother   . Urolithiasis Brother   . Diabetes Son   . Asthma Son   . Seizures Son   . Arthritis Maternal Grandmother   . Cervical cancer Maternal Grandmother        diagnosed in her 74s or 50s  . Arthritis Paternal Grandmother   . Asthma Son   . Diverticulosis Sister   . Breast cancer Sister 71  . Colon cancer Neg Hx   . Colon polyps Neg Hx   . Esophageal cancer Neg Hx   . Rectal cancer Neg Hx   . Stomach cancer Neg Hx    Social History   Social History Narrative   Relocated to [place] from [place]. Due to husband job transfer. 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 She lives with her husband. She has 2 grown sons- oldest recently graduated from college and took a job in [place].; youngest just graduated HS & ia attending [place].      Has had regular preventive care in [place]. At [facility] w/Dr. Alfonse Spruce. Only health concern is insomnia that developed about 5 years ago & has been treated with [place]. She has not practiced sleep hygiene.      Occasionally drinks sweet tea.     Allergies as of 04/13/2020      Reactions   Amoxicillin Hives   Sulfa Antibiotics Rash   Childhood rxn      Medication List       Accurate as of April 13, 2020  8:25 AM. If you have any questions, ask your nurse or doctor.        STOP taking these medications   Vitamin D 50 MCG (2000 UT) Caps Stopped by: Howard Pouch, DO     TAKE these medications   baclofen 10 MG tablet Commonly known as: LIORESAL Take 1 tablet (10 mg total) by mouth 2 (two) times daily.   Caltrate 600+D Plus Minerals 600-800 MG-UNIT Tabs Take by mouth.   Fish Oil 1000 MG Caps Take 1 capsule by mouth. 1246m   Fusion Plus Caps Take 1 capsule by mouth daily.   pantoprazole 20 MG tablet Commonly known as: PROTONIX Take 1 tablet (20 mg total) by mouth daily. Needs office visit for further refills.     pravastatin 20 MG tablet Commonly known as: PRAVACHOL Take 1 tablet (20 mg total) by mouth daily.   Spironolactone 25 MG/5ML Susp Take 1 tablet by mouth 2 (two) times daily.   zolpidem 5 MG tablet Commonly known as: AMBIEN Take 0.5-1 tablets (2.5-5 mg total) by mouth at bedtime as needed for sleep.       All past medical history, surgical history, allergies, family history,  immunizations andmedications were updated in the EMR today and reviewed under the history and medication portions of their EMR.     No results found for this or any previous visit (from the past 2160 hour(s)).    ROS: 14 pt review of systems performed and negative (unless mentioned in an HPI)  Objective: BP 114/80 (BP Location: Left Arm, Patient Position: Sitting, Cuff Size: Normal)   Pulse 68   Temp 98.5 F (36.9 C) (Oral)   Resp 16   Ht 5' 4"  (1.626 m)   Wt 165 lb 9.6 oz (75.1 kg)   LMP 07/30/2009 (Within Years)   BMI 28.43 kg/m  Gen: Afebrile. No acute distress. Pleasant female.  HENT: AT. [place].  Eyes:Pupils Equal Round Reactive to light, Extraocular movements intact,  Conjunctiva without redness, discharge or icterus. Neck/lymp/endocrine: Supple,no lymphadenopathy, no thyromegaly CV: RRR no murmur, no edema, +2/4 P posterior tibialis pulses Chest: CTAB, no wheeze or crackles Abd: Soft. NTND. BS present. no Masses palpated.  Skin: no rashes, purpura or petechiae.  Neuro:  Normal gait. PERLA. EOMi. Alert. Oriented x3 Psych: Normal affect, dress and demeanor. Normal speech. Normal thought content and judgment.   No exam data present  Assessment/plan: [name] is a 59 y.o. female present for CPE  Gastroesophageal reflux disease without esophagitis Stable Continue protonix.   Insomnia, unspecified type -stable.  - continue  ambien 5 mg QHS today.  - [facility] database reviewed09/15/21  - Contract signed.  - F/U 6 monthor  when needing refills.   Mixed hyperlipidemia/On statin  therapy/Family history of premature coronary artery disease - stable - She has made great dietary and exercise changes.  - continue   pravastatin. - continue fish oil 2400 mg QD - lipids due (last 04/2019), TSH and cmp  collected today   Prediabetes - last A1c 6.1--> 5.8-->5.8>>5.7 - monitor yearly as long as < 5.9 and fbg normal - cmp collected today  Vitamin D deficiency/Osteopenia - taking her daily supplement. Recent lab normal.  Bone density UTD through GYN  Iron deficiency: Taking daily iron.  - iron panel collected today  Return in about 7 months (around 10/31/2020) for CPE (30 min).   Orders Placed This Encounter  Procedures  . Lipid panel  . Comp Met (CMET)  . TSH  . Iron, TIBC and Ferritin Panel    Meds ordered this encounter  Medications  . zolpidem (AMBIEN) 5 MG tablet    Sig: Take 0.5-1 tablets (2.5-5 mg total) by mouth at bedtime as needed for sleep.    Dispense:  90 tablet    Refill:  1  . pantoprazole (PROTONIX) 20 MG tablet    Sig: Take 1 tablet (20 mg total) by mouth daily. Needs office visit for further refills.    Dispense:  90 tablet    Refill:  1  . baclofen (LIORESAL) 10 MG tablet    Sig: Take 1 tablet (10 mg total) by mouth 2 (two) times daily.    Dispense:  180 each    Refill:  1   Referral Orders  No referral(s) requested today     Electronically signed by: Howard Pouch, [place]

## 2020-04-14 LAB — IRON,TIBC AND FERRITIN PANEL
%SAT: 40 % (calc) (ref 16–45)
Ferritin: 103 ng/mL (ref 16–232)
Iron: 146 ug/dL (ref 45–160)
TIBC: 363 mcg/dL (calc) (ref 250–450)

## 2020-04-15 ENCOUNTER — Other Ambulatory Visit: Payer: Self-pay

## 2020-04-19 NOTE — Progress Notes (Signed)
59 y.o. G2P2 Married Caucasian female here for annual exam.    Patient complaining of external vaginal itching. Tried Vagisil and gets only temporarily relief.  Irritation is more external.  No discharge.  No odor.   No new exposures.   Good bladder control.   Vaccinated against Covid.   Both sons now engaged.   PCP:   Howard Pouch, DO  Patient's last menstrual period was 07/30/2009 (within years).           Sexually active: No.  The current method of family planning is status post hysterectomy.    Exercising: No.  tries to walk 3 miles daily Smoker:  no  Health Maintenance: Pap:  2013 normal History of abnormal Pap:  no MMG: 07-28-19  3D/Neg/density B/Birads1 Colonoscopy: 11/2019 normal;next 10 years BMD: 07-09-17  Result :mild Osteopenia TDaP: 04-03-13 Gardasil:   no LPF:XTKWIOX blood Hep C:donates blood Screening Labs:  PCP. Flu vaccine:  Completed.   reports that she has never smoked. She has never used smokeless tobacco. She reports current alcohol use. She reports that she does not use drugs.  Past Medical History:  Diagnosis Date  . Anemia    PAST HX- ON IRON   . Arthritis    neck  . Cataract    BEGINNING   . COVID-19 virus infection 05/2019  . Diverticulosis of colon    MILD  . Elevated cholesterol with elevated triglycerides   . Eustachian tube dysfunction, left 05/22/2018  . Family history of breast cancer   . Family history of cervical cancer   . GERD (gastroesophageal reflux disease)   . History of rectocele   . Insomnia    started ambien 5 ya.  . Osteopenia    --left femur  . Periodic heart flutter   . SVD (spontaneous vaginal delivery)    x 2  . Urinary incontinence     Past Surgical History:  Procedure Laterality Date  . BLADDER SUSPENSION N/A 11/10/2013   Procedure: TRANSVAGINAL TAPE (TVT) PROCEDURE;  Surgeon: Jamey Reas de Berton Lan, MD;  Location: [facility];  Service: Gynecology;  Laterality: N/A;  . COLONOSCOPY  2011    [place] [facility]   . CYSTOSCOPY N/A 11/10/2013   Procedure: CYSTOSCOPY;  Surgeon: Jamey Reas de Berton Lan, MD;  Location: [facility];  Service: Gynecology;  Laterality: N/A;  . IRRIGATION AND DEBRIDEMENT SEBACEOUS CYST  03/2017  . VAGINAL HYSTERECTOMY  11/11    Vag cuff, mild rectocele  . WISDOM TOOTH EXTRACTION      Current Outpatient Medications  Medication Sig Dispense Refill  . baclofen (LIORESAL) 10 MG tablet Take 1 tablet (10 mg total) by mouth 2 (two) times daily. 180 each 1  . Calcium Carbonate-Vit D-Min (CALTRATE 600+D PLUS MINERALS) 600-800 MG-UNIT TABS Take by mouth.    . Iron-FA-B Cmp-C-Biot-Probiotic (FUSION PLUS) CAPS Take 1 capsule by mouth daily.    . Omega 3 1200 MG CAPS Take 2 capsules by mouth.    . pantoprazole (PROTONIX) 20 MG tablet Take 1 tablet (20 mg total) by mouth daily. Needs office visit for further refills. 90 tablet 1  . pravastatin (PRAVACHOL) 20 MG tablet Take 1 tablet (20 mg total) by mouth daily. 90 tablet 3  . Spironolactone 25 MG/5ML SUSP Take 1 tablet by mouth 2 (two) times daily.    Marland Kitchen zolpidem (AMBIEN) 5 MG tablet Take 0.5-1 tablets (2.5-5 mg total) by mouth at bedtime as needed for sleep. 90 tablet 1   No  current facility-administered medications for this visit.    Family History  Problem Relation Age of Onset  . Arthritis Mother   . Hypertension Mother   . Hyperlipidemia Mother   . Heart disease Mother 78       dec  . Hypertension Father   . Hyperlipidemia Father   . Heart disease Father 36       smoked for 10 yrs., no muscle damage, 1 MI   . Pulmonary fibrosis Father        diagnosed end stage, cause unkn.  Marland Kitchen Urolithiasis Father   . Hyperlipidemia Brother   . Urolithiasis Brother   . Diabetes Son   . Asthma Son   . Seizures Son   . Arthritis Maternal Grandmother   . Cervical cancer Maternal Grandmother        diagnosed in her 40s or 53s  . Arthritis Paternal Grandmother   . Asthma Son   . Diverticulosis Sister   .  Breast cancer Sister 36  . Colon cancer Neg Hx   . Colon polyps Neg Hx   . Esophageal cancer Neg Hx   . Rectal cancer Neg Hx   . Stomach cancer Neg Hx     Review of Systems  All other systems reviewed and are negative.   Exam:   BP 132/76   Pulse 90   Resp 16   Ht 5' 4.25" (1.632 m)   Wt 165 lb 3.2 oz (74.9 kg)   LMP 07/30/2009 (Within Years)   BMI 28.14 kg/m     General appearance: alert, cooperative and appears stated age Head: normocephalic, without obvious abnormality, atraumatic Neck: no adenopathy, supple, symmetrical, trachea midline and thyroid normal to inspection and palpation Lungs: clear to auscultation bilaterally Breasts: normal appearance, no masses or tenderness, No nipple retraction or dimpling, No nipple discharge or bleeding, No axillary adenopathy Heart: regular rate and rhythm Abdomen: soft, non-tender; no masses, no organomegaly Extremities: extremities normal, atraumatic, no cyanosis or edema Skin: skin color, texture, turgor normal. No rashes .  1.5 cm lipoma of left inferior abdominal wall and 1.0 cm lipoma of the right thigh. Lymph nodes: cervical, supraclavicular, and axillary nodes normal. Neurologic: grossly normal  Pelvic: External genitalia: mild erythema of the labia minora.              No abnormal inguinal nodes palpated.              Urethra:  normal appearing urethra with no masses, tenderness or lesions              Bartholins and Skenes: normal                 Vagina: normal appearing vagina with normal color and discharge, no lesions.  First degree cystocele.               Cervix: absent              Pap taken: No. Bimanual Exam:  Uterus:  absent              Adnexa: no mass, fullness, tenderness              Rectal exam: Yes.  .  Confirms.              Anus:  normal sphincter tone, no lesions  Chaperone was present for exam.  Assessment:   Well woman visit with normal exam. Status post TVH.  Status post TVT.  Vulvovaginitis.  I suspect yeast.  Increased lifetime risk of breast cancer - 25% by TC model.  Sister tested  Negative, so genetic testing declined by patient.  Osteopenia.  Lipomas. Elevated cholesterol and TG. FH CAD.  Plan: Mammogram screening discussed. Self breast awareness reviewed. Pap and HR HPV as above. Guidelines for Calcium, Vitamin D, regular exercise program including cardiovascular and weight bearing exercise. Referral to Dr. Jana Hakim due to increased risk of breast cancer.  We discussed Breast MRI, which can be ordered through this office or Dr. Virgie Dad office.  BMD this December with mammogram.  Labs with PCP.  Affirm.  Mycolog II cream. We discussed lipomas. Follow up annually and prn.    After visit summary provided.

## 2020-04-20 ENCOUNTER — Ambulatory Visit (INDEPENDENT_AMBULATORY_CARE_PROVIDER_SITE_OTHER): Payer: BC Managed Care – PPO | Admitting: Obstetrics and Gynecology

## 2020-04-20 ENCOUNTER — Other Ambulatory Visit: Payer: Self-pay

## 2020-04-20 ENCOUNTER — Encounter: Payer: Self-pay | Admitting: Obstetrics and Gynecology

## 2020-04-20 VITALS — BP 132/76 | HR 90 | Resp 16 | Ht 64.25 in | Wt 165.2 lb

## 2020-04-20 DIAGNOSIS — Z01419 Encounter for gynecological examination (general) (routine) without abnormal findings: Secondary | ICD-10-CM

## 2020-04-20 DIAGNOSIS — Z9189 Other specified personal risk factors, not elsewhere classified: Secondary | ICD-10-CM | POA: Diagnosis not present

## 2020-04-20 DIAGNOSIS — Z780 Asymptomatic menopausal state: Secondary | ICD-10-CM

## 2020-04-20 DIAGNOSIS — N760 Acute vaginitis: Secondary | ICD-10-CM

## 2020-04-20 MED ORDER — NYSTATIN-TRIAMCINOLONE 100000-0.1 UNIT/GM-% EX CREA: 1.0000 "application " | TOPICAL_CREAM | Freq: Two times a day (BID) | CUTANEOUS | 0 refills | Status: DC

## 2020-04-20 NOTE — Patient Instructions (Signed)
EXERCISE AND DIET:  We recommended that you start or continue a regular exercise program for good health. Regular exercise means any activity that makes your heart beat faster and makes you sweat.  We recommend exercising at least 30 minutes per day at least 3 days a week, preferably 4 or 5.  We also recommend a diet low in fat and sugar.  Inactivity, poor dietary choices and obesity can cause diabetes, heart attack, stroke, and kidney damage, among others.    ALCOHOL AND SMOKING:  Women should limit their alcohol intake to no more than 7 drinks/beers/glasses of wine (combined, not each!) per week. Moderation of alcohol intake to this level decreases your risk of breast cancer and liver damage. And of course, no recreational drugs are part of a healthy lifestyle.  And absolutely no smoking or even second hand smoke. Most people know smoking can cause heart and lung diseases, but did you know it also contributes to weakening of your bones? Aging of your skin?  Yellowing of your teeth and nails?  CALCIUM AND VITAMIN D:  Adequate intake of calcium and Vitamin D are recommended.  The recommendations for exact amounts of these supplements seem to change often, but generally speaking 600 mg of calcium (either carbonate or citrate) and 800 units of Vitamin D per day seems prudent. Certain women may benefit from higher intake of Vitamin D.  If you are among these women, your doctor will have told you during your visit.    PAP SMEARS:  Pap smears, to check for cervical cancer or precancers,  have traditionally been done yearly, although recent scientific advances have shown that most women can have pap smears less often.  However, every woman still should have a physical exam from her gynecologist every year. It will include a breast check, inspection of the vulva and vagina to check for abnormal growths or skin changes, a visual exam of the cervix, and then an exam to evaluate the size and shape of the uterus and  ovaries.  And after 61 years of age, a rectal exam is indicated to check for rectal cancers. We will also provide age appropriate advice regarding health maintenance, like when you should have certain vaccines, screening for sexually transmitted diseases, bone density testing, colonoscopy, mammograms, etc.   MAMMOGRAMS:  All women over 40 years old should have a yearly mammogram. Many facilities now offer a "3D" mammogram, which may cost around $50 extra out of pocket. If possible,  we recommend you accept the option to have the 3D mammogram performed.  It both reduces the number of women who will be called back for extra views which then turn out to be normal, and it is better than the routine mammogram at detecting truly abnormal areas.    COLONOSCOPY:  Colonoscopy to screen for colon cancer is recommended for all women at age 50.  We know, you hate the idea of the prep.  We agree, BUT, having colon cancer and not knowing it is worse!!  Colon cancer so often starts as a polyp that can be seen and removed at colonscopy, which can quite literally save your life!  And if your first colonoscopy is normal and you have no family history of colon cancer, most women don't have to have it again for 10 years.  Once every ten years, you can do something that may end up saving your life, right?  We will be happy to help you get it scheduled when you are ready.    Be sure to check your insurance coverage so you understand how much it will cost.  It may be covered as a preventative service at no cost, but you should check your particular policy.      Lipoma  A lipoma is a noncancerous (benign) tumor that is made up of fat cells. This is a very common type of soft-tissue growth. Lipomas are usually found under the skin (subcutaneous). They may occur in any tissue of the body that contains fat. Common areas for lipomas to appear include the back, arms, shoulders, buttocks, and thighs. Lipomas grow slowly, and they are  usually painless. Most lipomas do not cause problems and do not require treatment. What are the causes? The cause of this condition is not known. What increases the risk? You are more likely to develop this condition if:  You are 13-61 years old.  You have a family history of lipomas. What are the signs or symptoms? A lipoma usually appears as a small, round bump under the skin. In most cases, the lump will:  Feel soft or rubbery.  Not cause pain or other symptoms. However, if a lipoma is located in an area where it pushes on nerves, it can become painful or cause other symptoms. How is this diagnosed? A lipoma can usually be diagnosed with a physical exam. You may also have tests to confirm the diagnosis and to rule out other conditions. Tests may include:  Imaging tests, such as a CT scan or an MRI.  Removal of a tissue sample to be looked at under a microscope (biopsy). How is this treated? Treatment for this condition depends on the size of the lipoma and whether it is causing any symptoms.  For small lipomas that are not causing problems, no treatment is needed.  If a lipoma is bigger or it causes problems, surgery may be done to remove the lipoma. Lipomas can also be removed to improve appearance. Most often, the procedure is done after applying a medicine that numbs the area (local anesthetic).  Liposuction may be done to reduce the size of the lipoma before it is removed through surgery, or it may be done to remove the lipoma. Lipomas are removed with this method in order to limit incision size and scarring. A liposuction tube is inserted through a small incision into the lipoma, and the contents of the lipoma are removed through the tube with suction. Follow these instructions at home:  Watch your lipoma for any changes.  Keep all follow-up visits as told by your health care provider. This is important. Contact a health care provider if:  Your lipoma becomes larger or  hard.  Your lipoma becomes painful, red, or increasingly swollen. These could be signs of infection or a more serious condition. Get help right away if:  You develop tingling or numbness in an area near the lipoma. This could indicate that the lipoma is causing nerve damage. Summary  A lipoma is a noncancerous tumor that is made up of fat cells.  Most lipomas do not cause problems and do not require treatment.  If a lipoma is bigger or it causes problems, surgery may be done to remove the lipoma.  Contact a health care provider if your lipoma becomes larger or hard, or if it becomes painful, red, or increasingly swollen. Pain, redness, and swelling could be signs of infection or a more serious condition. This information is not intended to replace advice given to you by your health care provider.  Make sure you discuss any questions you have with your health care provider. Document Revised: 03/02/2019 Document Reviewed: 03/02/2019 Elsevier Patient Education  [place].

## 2020-04-21 LAB — VAGINITIS/VAGINOSIS, DNA PROBE
Candida Species: NEGATIVE
Gardnerella vaginalis: NEGATIVE
Trichomonas vaginosis: NEGATIVE

## 2020-04-22 ENCOUNTER — Telehealth: Payer: Self-pay | Admitting: Oncology

## 2020-04-22 NOTE — Telephone Encounter (Signed)
Received a new pt referral from dr. Quincy Simmonds for the high risk breast clinic. Ms. Briere has been cld and scheduled to see Dr. Jana Hakim on 10/21 at 4pm w/labs at 330pm. Pt aware to arrive 15 minutes early.

## 2020-04-26 ENCOUNTER — Other Ambulatory Visit: Payer: Self-pay | Admitting: Obstetrics and Gynecology

## 2020-04-26 DIAGNOSIS — Z1231 Encounter for screening mammogram for malignant neoplasm of breast: Secondary | ICD-10-CM

## 2020-05-18 ENCOUNTER — Other Ambulatory Visit: Payer: Self-pay | Admitting: *Deleted

## 2020-05-18 DIAGNOSIS — Z9189 Other specified personal risk factors, not elsewhere classified: Secondary | ICD-10-CM

## 2020-05-18 NOTE — Progress Notes (Signed)
[place]  Telephone:([phone]) [phone] Fax:(336) [phone]     ID: ADISEN BENNION DOB: 03-Jan-1961  MR#: [id number]  WGN#:[id number]  Patient Care Team: Ma Hillock, DO as PCP - General (Family Medicine) Nunzio Cobbs, MD as Consulting Physician (Obstetrics and Gynecology) Clint Guy as Counselor (Genetic Counselor) Mauri Pole, MD as Consulting Physician (Gastroenterology) Chauncey Cruel, MD OTHER MD:  CHIEF COMPLAINT: Breast cancer high risk  CURRENT TREATMENT: Tamoxifen   HISTORY OF CURRENT ILLNESS: [name] "[name]" has a high risk for breast cancer. Her sister was diagnosed with triple negative breast cancer in 2020 at age 80. [name] met with our genetic counselor on 05/12/2019 but opted not to pursue testing at that time. She instead opted to wait for her sister to undergo genetic testing first. Her sisters results were negative, thus additional testing was not pursued.  When the patient met with Dr. Quincy Simmonds most recently in September 2021 a TC score was calculated predicting a 25% lifetime risk of breast cancer.  Dr. Quincy Simmonds has discussed mammogram, breast self-awareness, bone and exercise guidelines, and the possibility of MRIs for breast cancer surveillance.  She was referred here for further discussion.  The patient's subsequent history is as detailed below.   INTERVAL HISTORY: Noma "[name]" was evaluated in the high risk clinic on 05/19/2020  Her most recent screening mammogram from 07/28/2019 showed: breast density category B; no evidence of malignancy. She is scheduled for her next mammogram, as well as bone density screening, on 08/03/2020.   REVIEW OF SYSTEMS: The patient denies unusual headaches, visual changes, nausea, vomiting, stiff neck, dizziness, or gait imbalance. There has been no cough, phlegm production, or pleurisy, no chest pain or pressure, and no change in bowel or bladder habits. The patient denies  fever, rash, bleeding, unexplained fatigue or unexplained weight loss.  She has received both doses of the COVID-19 vaccine [employer]).  She exercises chiefly by walking but is planning to join a gym.  A detailed review of systems was otherwise entirely negative.   PAST MEDICAL HISTORY: Past Medical History:  Diagnosis Date  . Anemia    PAST HX- ON IRON   . Arthritis    neck  . Cataract    BEGINNING   . COVID-19 virus infection 05/2019  . Diverticulosis of colon    MILD  . Elevated cholesterol with elevated triglycerides   . Eustachian tube dysfunction, left 05/22/2018  . Family history of breast cancer   . Family history of cervical cancer   . GERD (gastroesophageal reflux disease)   . History of rectocele   . Insomnia    started ambien 5 ya.  . Osteopenia    --left femur  . Periodic heart flutter   . SVD (spontaneous vaginal delivery)    x 2  . Urinary incontinence     PAST SURGICAL HISTORY: Past Surgical History:  Procedure Laterality Date  . BLADDER SUSPENSION N/A 11/10/2013   Procedure: TRANSVAGINAL TAPE (TVT) PROCEDURE;  Surgeon: Jamey Reas de Berton Lan, MD;  Location: [facility];  Service: Gynecology;  Laterality: N/A;  . COLONOSCOPY  2011   [place] [facility]   . CYSTOSCOPY N/A 11/10/2013   Procedure: CYSTOSCOPY;  Surgeon: Jamey Reas de Berton Lan, MD;  Location: [facility];  Service: Gynecology;  Laterality: N/A;  . IRRIGATION AND DEBRIDEMENT SEBACEOUS CYST  03/2017  . VAGINAL HYSTERECTOMY  11/11    Vag cuff, mild rectocele  . WISDOM  TOOTH EXTRACTION      FAMILY HISTORY: Family History  Problem Relation Age of Onset  . Arthritis Mother   . Hypertension Mother   . Hyperlipidemia Mother   . Heart disease Mother 93       dec  . Hypertension Father   . Hyperlipidemia Father   . Heart disease Father 43       smoked for 10 yrs., no muscle damage, 1 MI   . Pulmonary fibrosis Father        diagnosed end stage, cause unkn.  Marland Kitchen Urolithiasis  Father   . Hyperlipidemia Brother   . Urolithiasis Brother   . Diabetes Son   . Asthma Son   . Seizures Son   . Arthritis Maternal Grandmother   . Cervical cancer Maternal Grandmother        diagnosed in her 88s or 10s  . Arthritis Paternal Grandmother   . Asthma Son   . Diverticulosis Sister   . Breast cancer Sister 95  . Colon cancer Neg Hx   . Colon polyps Neg Hx   . Esophageal cancer Neg Hx   . Rectal cancer Neg Hx   . Stomach cancer Neg Hx   From the genetics assessment note 05/12/2019:   Ms. Koslow has two sons, one who is 60 and one who is 54. She has two brothers who are 23 and 31, and a sister who is 38 and was recently diagnosed with triple negative breast cancer. This sister has not yet had genetic testing for her cancer, although she is interested. Of note, the sister has also had a hysterectomy and had her ovaries removed at age 61.   Ms. Rebel's mother died at the age of 48 and did not have cancer. Her mother did have a hysterectomy at age 57 due to fibroids. Ms. Deloria has one maternal aunt who is 25, and two cousins who have not had cancer. Her maternal grandmother died at age 46 and had cervical cancer in her 3s or 56s. Ms. Kautzman maternal grandfather died at age 7 and did not have cancer.  Ms. Pelle father died at age 97 and did not have cancer. She did not have any paternal aunts or uncles. Her paternal grandmother died at age 21, and her grandfather died at age 4. Both of these individuals had Alzheimers, but neither had cancer.   Ms. Mcnee is unaware of previous family history of genetic testing for hereditary cancer risks. There is no reported Ashkenazi Jewish ancestry. There is no known consanguinity.    GYNECOLOGIC HISTORY:  Patient's last menstrual period was 07/30/2009 (within years). Menarche: 59 years old Age at first live birth: 59 years old Valentine P 2 LMP 48 Contraceptive: Used oral contraceptives for many years with no complications HRT no    Hysterectomy? Yes, 2011 BSO?  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  Her husband [name] works as a Occupational hygienist for a [employer] in [place].  Son Thedore Mins lives in [place] and works as a Government social research officer son Richardson Landry lives in [place] and works for [employer].  Both sons are getting engaged in the fall 2021.  The patient has no grandchildren.  She is a Tourist information centre manager    ADVANCED DIRECTIVES: In the absence of any documentation to the contrary, the patient's spouse is their HCPOA.    HEALTH MAINTENANCE: Social History   Tobacco Use  . Smoking status: Never Smoker  .  Smokeless tobacco: Never Used  Vaping Use  . Vaping Use: Never used  Substance Use Topics  . Alcohol use: Yes    Alcohol/week: 0.0 standard drinks    Comment: 1 glass of wine/month  . Drug use: No     Colonoscopy: 11/2019, repeat due 2031  PAP: 2013, negative  Bone density: 06/2017   Allergies  Allergen Reactions  . Amoxicillin Hives  . Sulfa Antibiotics Rash    Childhood rxn    Current Outpatient Medications  Medication Sig Dispense Refill  . baclofen (LIORESAL) 10 MG tablet Take 1 tablet (10 mg total) by mouth 2 (two) times daily. 180 each 1  . Calcium Carbonate-Vit D-Min (CALTRATE 600+D PLUS MINERALS) 600-800 MG-UNIT TABS Take by mouth.    . Iron-FA-B Cmp-C-Biot-Probiotic (FUSION PLUS) CAPS Take 1 capsule by mouth daily.    Marland Kitchen nystatin-triamcinolone (MYCOLOG II) cream Apply 1 application topically 2 (two) times daily. Apply to affected area BID for up to 7 days. 60 g 0  . Omega 3 1200 MG CAPS Take 2 capsules by mouth.    . pantoprazole (PROTONIX) 20 MG tablet Take 1 tablet (20 mg total) by mouth daily. Needs office visit for further refills. 90 tablet 1  . pravastatin (PRAVACHOL) 20 MG tablet Take 1 tablet (20 mg total) by mouth daily. 90 tablet 3  . Spironolactone 25 MG/5ML SUSP Take 1 tablet by mouth 2 (two) times daily.    Marland Kitchen  zolpidem (AMBIEN) 5 MG tablet Take 0.5-1 tablets (2.5-5 mg total) by mouth at bedtime as needed for sleep. 90 tablet 1   No current facility-administered medications for this visit.    OBJECTIVE: [place] woman in no acute distress  Vitals:   05/19/20 1555  BP: 138/67  Pulse: 72  Resp: 18  Temp: 98 F (36.7 C)  SpO2: 95%     Body mass index is 28.29 kg/m.   Wt Readings from Last 3 Encounters:  05/19/20 166 lb 1.6 oz (75.3 kg)  04/20/20 165 lb 3.2 oz (74.9 kg)  04/13/20 165 lb 9.6 oz (75.1 kg)      ECOG FS:1 - Symptomatic but completely ambulatory  Ocular: Sclerae unicteric, pupils round and equal Ear-nose-throat: Wearing a mask Lymphatic: No cervical or supraclavicular adenopathy Lungs no rales or rhonchi Heart regular rate and rhythm Abd soft, nontender, positive bowel sounds MSK no focal spinal tenderness, no joint edema Neuro: non-focal, well-oriented, appropriate affect Breasts: No masses palpated.  No skin or nipple changes of concern.  Both axillae are benign.   LAB RESULTS:  CMP     Component Value Date/Time   NA 139 05/19/2020 1521   K 4.6 05/19/2020 1521   CL 103 05/19/2020 1521   CO2 29 05/19/2020 1521   GLUCOSE 97 05/19/2020 1521   BUN 19 05/19/2020 1521   CREATININE 1.12 (H) 05/19/2020 1521   CREATININE 0.86 01/26/2013 0812   CALCIUM 10.5 (H) 05/19/2020 1521   PROT 7.5 05/19/2020 1521   ALBUMIN 4.2 05/19/2020 1521   AST 17 05/19/2020 1521   ALT 23 05/19/2020 1521   ALKPHOS 57 05/19/2020 1521   BILITOT 0.4 05/19/2020 1521   GFRNONAA 56 (L) 05/19/2020 1521   GFRAA >90 11/11/2013 0530    No results found for: TOTALPROTELP, ALBUMINELP, A1GS, A2GS, BETS, BETA2SER, GAMS, MSPIKE, SPEI  Lab Results  Component Value Date   WBC 7.5 05/19/2020   NEUTROABS 4.8 05/19/2020   HGB 14.6 05/19/2020   HCT 43.4 05/19/2020   MCV 93.3 05/19/2020  PLT 294 05/19/2020    No results found for: LABCA2  No components found for: VFIEPP295  No results for  input(s): INR in the last 168 hours.  No results found for: LABCA2  No results found for: JOA416  No results found for: SAY301  No results found for: SWF093  No results found for: CA2729  No components found for: HGQUANT  No results found for: CEA1 / No results found for: CEA1   No results found for: AFPTUMOR  No results found for: CHROMOGRNA  No results found for: KPAFRELGTCHN, LAMBDASER, KAPLAMBRATIO (kappa/lambda light chains)  No results found for: HGBA, HGBA2QUANT, HGBFQUANT, HGBSQUAN (Hemoglobinopathy evaluation)   No results found for: LDH  Lab Results  Component Value Date   IRON 146 04/13/2020   TIBC 363 04/13/2020   IRONPCTSAT 40 04/13/2020   (Iron and TIBC)  Lab Results  Component Value Date   FERRITIN 103 04/13/2020    Urinalysis    Component Value Date/Time   COLORURINE YELLOW 11/10/2013 0615   APPEARANCEUR CLEAR 11/10/2013 0615   LABSPEC 1.025 11/10/2013 0615   PHURINE 6.0 11/10/2013 0615   GLUCOSEU NEGATIVE 11/10/2013 0615   HGBUR SMALL (A) 11/10/2013 0615   BILIRUBINUR n 08/29/2016 0834   KETONESUR NEGATIVE 11/10/2013 0615   PROTEINUR n 08/29/2016 0834   PROTEINUR NEGATIVE 11/10/2013 0615   UROBILINOGEN negative 08/29/2016 0834   UROBILINOGEN 0.2 11/10/2013 0615   NITRITE n 08/29/2016 0834   NITRITE NEGATIVE 11/10/2013 0615   LEUKOCYTESUR Negative 08/29/2016 0834     STUDIES: No results found.   ELIGIBLE FOR AVAILABLE RESEARCH PROTOCOL: no  ASSESSMENT: 59 y.o. [facility] woman with a Animator score predicting a lifetime breast cancer risk in the 25% range.  PLAN: We discussed the obvious fact that if she has a 25% lifetime risk of developing breast cancer that means she has a 75% chance of not developing breast cancer in her lifetime and that is a good place to start from.  She is already very proactive as far as mammography is concerned and she gets a yearly physician breast exam under Dr. Quincy Simmonds.  She also has a good exercise  program and a good diet.  She would like to do anything else that would be reasonable so we discussed risk reduction strategies and intensified screening.  As far as risk reduction, if she took tamoxifen for 5years she would cut her risk of breast cancer in half.  The this would essentially bring her risk close to that of the general population.  We discussed the mechanism of action of off tamoxifen and the possible toxicities, side effects and complications.  The fact that she took oral contraceptives for many years with no clotting issues is favorable as is the fact that she is status post hysterectomy.  She is interested in pursuing this risk reduction strategy  We also discussed intensified screening.  Because her breast density is category B this is not as attractive as it could be in other cases.  She understands the risk of false positives as well as issues related to cost.  Finally since if she takes tamoxifen and she will have a significantly decreased breast cancer risk she does not wish to pursue intensified screening . Accordingly the plan is for her to start tamoxifen.  I will have a virtual visit with her in about 2 months just to make sure she is tolerating it well if she is she can continue it for a total of 5 years  under Dr. Elza Rafter monitoring.  Lattie Haw did express an interest in vaginal estrogens.  If she tolerates tamoxifen well we can consider that and so long as she is on tamoxifen it is safe for her to use either Estrace cream, or Vagifem suppositories, or Estring.  We can decide that at the time of the 2-monthvisit  She also brought me a record of her sister's genetic testing which we reviewed.  Total encounter time 55 minutes.*Sarajane JewsC. Kree Rafter, MD 05/19/2020 4:57 PM [facility] [place] [id number]. [phone]   Fax. [phone]  This document serves as a record of services personally performed  by GLurline Del MD. It was created on his behalf by KWilburn Mylar a trained medical scribe. The creation of this record is based on the scribe's personal observations and the provider's statements to them.   I, GLurline DelMD, have reviewed the above documentation for accuracy and completeness, and I agree with the above.    *Total Encounter Time as defined by the Centers for Medicare and Medicaid Services includes, in addition to the face-to-face time of a patient visit (documented in the note above) non-face-to-face time: obtaining and reviewing outside history, ordering and reviewing medications, tests or procedures, care coordination (communications with other health care professionals or caregivers) and documentation in the medical record.

## 2020-05-19 ENCOUNTER — Inpatient Hospital Stay: Payer: BC Managed Care – PPO | Attending: Oncology | Admitting: Oncology

## 2020-05-19 ENCOUNTER — Inpatient Hospital Stay: Payer: BC Managed Care – PPO

## 2020-05-19 ENCOUNTER — Other Ambulatory Visit: Payer: Self-pay

## 2020-05-19 VITALS — BP 138/67 | HR 72 | Temp 98.0°F | Resp 18 | Ht 64.25 in | Wt 166.1 lb

## 2020-05-19 DIAGNOSIS — Z9189 Other specified personal risk factors, not elsewhere classified: Secondary | ICD-10-CM

## 2020-05-19 DIAGNOSIS — K219 Gastro-esophageal reflux disease without esophagitis: Secondary | ICD-10-CM

## 2020-05-19 DIAGNOSIS — Z8049 Family history of malignant neoplasm of other genital organs: Secondary | ICD-10-CM | POA: Diagnosis not present

## 2020-05-19 DIAGNOSIS — Z8616 Personal history of COVID-19: Secondary | ICD-10-CM | POA: Diagnosis not present

## 2020-05-19 DIAGNOSIS — Z882 Allergy status to sulfonamides status: Secondary | ICD-10-CM | POA: Insufficient documentation

## 2020-05-19 DIAGNOSIS — Z8261 Family history of arthritis: Secondary | ICD-10-CM

## 2020-05-19 DIAGNOSIS — Z8249 Family history of ischemic heart disease and other diseases of the circulatory system: Secondary | ICD-10-CM | POA: Insufficient documentation

## 2020-05-19 DIAGNOSIS — Z841 Family history of disorders of kidney and ureter: Secondary | ICD-10-CM

## 2020-05-19 DIAGNOSIS — Z836 Family history of other diseases of the respiratory system: Secondary | ICD-10-CM | POA: Insufficient documentation

## 2020-05-19 DIAGNOSIS — Z803 Family history of malignant neoplasm of breast: Secondary | ICD-10-CM

## 2020-05-19 DIAGNOSIS — Z79899 Other long term (current) drug therapy: Secondary | ICD-10-CM | POA: Diagnosis not present

## 2020-05-19 DIAGNOSIS — Z1501 Genetic susceptibility to malignant neoplasm of breast: Secondary | ICD-10-CM

## 2020-05-19 DIAGNOSIS — Z880 Allergy status to penicillin: Secondary | ICD-10-CM | POA: Diagnosis not present

## 2020-05-19 DIAGNOSIS — Z820 Family history of epilepsy and other diseases of the nervous system: Secondary | ICD-10-CM | POA: Diagnosis not present

## 2020-05-19 DIAGNOSIS — Z8349 Family history of other endocrine, nutritional and metabolic diseases: Secondary | ICD-10-CM | POA: Insufficient documentation

## 2020-05-19 DIAGNOSIS — Z8379 Family history of other diseases of the digestive system: Secondary | ICD-10-CM | POA: Diagnosis not present

## 2020-05-19 DIAGNOSIS — M85852 Other specified disorders of bone density and structure, left thigh: Secondary | ICD-10-CM | POA: Diagnosis not present

## 2020-05-19 DIAGNOSIS — Z833 Family history of diabetes mellitus: Secondary | ICD-10-CM | POA: Insufficient documentation

## 2020-05-19 LAB — CBC WITH DIFFERENTIAL (CANCER CENTER ONLY)
Abs Immature Granulocytes: 0.02 10*3/uL (ref 0.00–0.07)
Basophils Absolute: 0 10*3/uL (ref 0.0–0.1)
Basophils Relative: 0 %
Eosinophils Absolute: 0.1 10*3/uL (ref 0.0–0.5)
Eosinophils Relative: 1 %
HCT: 43.4 % (ref 36.0–46.0)
Hemoglobin: 14.6 g/dL (ref 12.0–15.0)
Immature Granulocytes: 0 %
Lymphocytes Relative: 28 %
Lymphs Abs: 2.1 10*3/uL (ref 0.7–4.0)
MCH: 31.4 pg (ref 26.0–34.0)
MCHC: 33.6 g/dL (ref 30.0–36.0)
MCV: 93.3 fL (ref 80.0–100.0)
Monocytes Absolute: 0.5 10*3/uL (ref 0.1–1.0)
Monocytes Relative: 7 %
Neutro Abs: 4.8 10*3/uL (ref 1.7–7.7)
Neutrophils Relative %: 64 %
Platelet Count: 294 10*3/uL (ref 150–400)
RBC: 4.65 MIL/uL (ref 3.87–5.11)
RDW: 12.4 % (ref 11.5–15.5)
WBC Count: 7.5 10*3/uL (ref 4.0–10.5)
nRBC: 0 % (ref 0.0–0.2)

## 2020-05-19 LAB — CMP (CANCER CENTER ONLY)
ALT: 23 U/L (ref 0–44)
AST: 17 U/L (ref 15–41)
Albumin: 4.2 g/dL (ref 3.5–5.0)
Alkaline Phosphatase: 57 U/L (ref 38–126)
Anion gap: 7 (ref 5–15)
BUN: 19 mg/dL (ref 6–20)
CO2: 29 mmol/L (ref 22–32)
Calcium: 10.5 mg/dL — ABNORMAL HIGH (ref 8.9–10.3)
Chloride: 103 mmol/L (ref 98–111)
Creatinine: 1.12 mg/dL — ABNORMAL HIGH (ref 0.44–1.00)
GFR, Estimated: 56 mL/min — ABNORMAL LOW (ref >60)
Glucose, Bld: 97 mg/dL (ref 70–99)
Potassium: 4.6 mmol/L (ref 3.5–5.1)
Sodium: 139 mmol/L (ref 135–145)
Total Bilirubin: 0.4 mg/dL (ref 0.3–1.2)
Total Protein: 7.5 g/dL (ref 6.5–8.1)

## 2020-05-19 MED ORDER — TAMOXIFEN CITRATE 20 MG PO TABS: 20.0000 mg | ORAL_TABLET | Freq: Every day | ORAL | 12 refills | Status: AC

## 2020-05-20 ENCOUNTER — Telehealth: Payer: Self-pay | Admitting: *Deleted

## 2020-05-20 NOTE — Telephone Encounter (Signed)
-----   Message from Gardenia Phlegm, NP sent at 05/19/2020  4:48 PM EDT ----- Kidney function is slightly increased.  Is patient drinking enough water.   ----- Message ----- From: Buel Ream, [facility] [place] Sent: 05/19/2020   3:31 PM EDT To: Chauncey Cruel, MD

## 2020-05-20 NOTE — Telephone Encounter (Signed)
Attempt x1 to contact pt regarding increase in kidney function per request of Wilber Bihari, NP.  No answer, LVM to return call to the office.

## 2020-05-28 ENCOUNTER — Encounter: Payer: Self-pay | Admitting: Family Medicine

## 2020-07-18 ENCOUNTER — Encounter: Payer: Self-pay | Admitting: Family Medicine

## 2020-07-18 NOTE — Telephone Encounter (Signed)
Spoke with pharmacy to give approval for medication to be filled (90,0). Pharmacy did not have new Rx on file. Pt will need to schedule appt.

## 2020-08-03 ENCOUNTER — Ambulatory Visit
Admission: RE | Admit: 2020-08-03 | Discharge: 2020-08-03 | Disposition: A | Discharge status: Home / Self Care | Payer: BC Managed Care – PPO | Source: Ambulatory Visit | Attending: Obstetrics and Gynecology | Admitting: Obstetrics and Gynecology

## 2020-08-03 ENCOUNTER — Encounter: Payer: Self-pay | Admitting: Family Medicine

## 2020-08-03 ENCOUNTER — Other Ambulatory Visit: Payer: Self-pay

## 2020-08-03 DIAGNOSIS — Z780 Asymptomatic menopausal state: Secondary | ICD-10-CM | POA: Diagnosis not present

## 2020-08-03 DIAGNOSIS — Z1231 Encounter for screening mammogram for malignant neoplasm of breast: Secondary | ICD-10-CM

## 2020-08-03 DIAGNOSIS — M85851 Other specified disorders of bone density and structure, right thigh: Secondary | ICD-10-CM | POA: Diagnosis not present

## 2020-08-11 DIAGNOSIS — L649 Androgenic alopecia, unspecified: Secondary | ICD-10-CM | POA: Diagnosis not present

## 2020-08-30 ENCOUNTER — Encounter: Payer: Self-pay | Admitting: Obstetrics and Gynecology

## 2020-08-31 ENCOUNTER — Other Ambulatory Visit: Payer: Self-pay

## 2020-08-31 ENCOUNTER — Ambulatory Visit (INDEPENDENT_AMBULATORY_CARE_PROVIDER_SITE_OTHER): Payer: BC Managed Care – PPO | Admitting: Obstetrics and Gynecology

## 2020-08-31 ENCOUNTER — Encounter: Payer: Self-pay | Admitting: Obstetrics and Gynecology

## 2020-08-31 VITALS — BP 130/68 | HR 88 | Ht 64.25 in | Wt 161.0 lb

## 2020-08-31 DIAGNOSIS — D1801 Hemangioma of skin and subcutaneous tissue: Secondary | ICD-10-CM

## 2020-08-31 DIAGNOSIS — N763 Subacute and chronic vulvitis: Secondary | ICD-10-CM

## 2020-08-31 LAB — WET PREP FOR TRICH, YEAST, CLUE

## 2020-08-31 MED ORDER — TRIAMCINOLONE ACETONIDE 0.5 % EX OINT: 1.0000 "application " | TOPICAL_OINTMENT | Freq: Two times a day (BID) | CUTANEOUS | 2 refills | Status: DC

## 2020-08-31 MED ORDER — FLUCONAZOLE 150 MG PO TABS
150.0000 mg | ORAL_TABLET | Freq: Once | ORAL | 0 refills | Status: AC
Start: 2020-08-31 — End: 2020-08-31

## 2020-08-31 NOTE — Progress Notes (Signed)
GYNECOLOGY  VISIT   HPI: 60 y.o.   Married  Caucasian  female   G2P2 with Patient's last menstrual period was 07/30/2009 (within years).   here for vaginal itching.  Itching at the top of the vulva.  Uses Mycolog for two weeks at a time and feels better, but then symptoms recur.  Denies vaginal dryness.  Sometimes has discharge.  Wonders if she has a sebaceous cyst again.   She is on Tamoxifen, and knows this can cause some discharge.   Uses Dove soap.   GYNECOLOGIC HISTORY: Patient's last menstrual period was 07/30/2009 (within years). Contraception: Hyst Menopausal hormone therapy:  none Last mammogram: 08-03-20 3D/Neg/BiRads1 Last pap smear: 2013 normal        OB History    Gravida  2   Para  2   Term      Preterm      AB      Living  2     SAB      IAB      Ectopic      Multiple      Live Births           Obstetric Comments  Menarche 60 yo.           Patient Active Problem List   Diagnosis Date Noted  . Iron deficiency 04/13/2020  . Family history of breast cancer   . Family history of cervical cancer   . At high risk for breast cancer 05/06/2019  . Prediabetes 05/22/2018  . Mixed hyperlipidemia 05/22/2018  . On statin therapy 05/22/2018  . GERD (gastroesophageal reflux disease) 01/02/2016  . Osteopenia 08/11/2015  . Ulnar nerve compression 03/29/2015  . Vitamin D deficiency 11/23/2014  . Insomnia 01/19/2013  . Family history of premature coronary artery disease 01/19/2013    Past Medical History:  Diagnosis Date  . Anemia    PAST HX- ON IRON   . Arthritis    neck  . Cataract    BEGINNING   . COVID-19 virus infection 05/2019  . Diverticulosis of colon    MILD  . Elevated cholesterol with elevated triglycerides   . Eustachian tube dysfunction, left 05/22/2018  . Family history of breast cancer   . Family history of cervical cancer   . GERD (gastroesophageal reflux disease)   . History of rectocele   . Insomnia    started  ambien 5 ya.  . Osteopenia    --left femur  . Periodic heart flutter   . SVD (spontaneous vaginal delivery)    x 2  . Urinary incontinence     Past Surgical History:  Procedure Laterality Date  . BLADDER SUSPENSION N/A 11/10/2013   Procedure: TRANSVAGINAL TAPE (TVT) PROCEDURE;  Surgeon: Jamey Reas de Berton Lan, MD;  Location[facility];  Service: Gynecology;  Laterality: N/A;  . COLONOSCOPY  2011   [place] [facility]   . CYSTOSCOPY N/A 11/10/2013   Procedure: CYSTOSCOPY;  Surgeon: Jamey Reas de Berton Lan, MD;  Location: [facility];  Service: Gynecology;  Laterality: N/A;  . IRRIGATION AND DEBRIDEMENT SEBACEOUS CYST  03/2017  . VAGINAL HYSTERECTOMY  11/11    Vag cuff, mild rectocele  . WISDOM TOOTH EXTRACTION      Current Outpatient Medications  Medication Sig Dispense Refill  . baclofen (LIORESAL) 10 MG tablet Take 1 tablet (10 mg total) by mouth 2 (two) times daily. 180 each 1  . Calcium Carbonate-Vit D-Min (CALTRATE 600+D PLUS MINERALS) 600-800 MG-UNIT  TABS Take by mouth.    . Iron-FA-B Cmp-C-Biot-Probiotic (FUSION PLUS) CAPS Take 1 capsule by mouth daily.    Marland Kitchen nystatin-triamcinolone (MYCOLOG II) cream Apply 1 application topically 2 (two) times daily. Apply to affected area BID for up to 7 days. 60 g 0  . Omega 3 1200 MG CAPS Take 2 capsules by mouth.    . pantoprazole (PROTONIX) 20 MG tablet Take 1 tablet (20 mg total) by mouth daily. Needs office visit for further refills. 90 tablet 1  . pravastatin (PRAVACHOL) 20 MG tablet Take 1 tablet (20 mg total) by mouth daily. 90 tablet 3  . Spironolactone 25 MG/5ML SUSP Take 1 tablet by mouth 2 (two) times daily.    . tamoxifen (NOLVADEX) 20 MG tablet Take 20 mg by mouth daily.    Marland Kitchen zolpidem (AMBIEN) 5 MG tablet Take 0.5-1 tablets (2.5-5 mg total) by mouth at bedtime as needed for sleep. 90 tablet 1   No current facility-administered medications for this visit.     ALLERGIES: Amoxicillin and Sulfa  antibiotics  Family History  Problem Relation Age of Onset  . Arthritis Mother   . Hypertension Mother   . Hyperlipidemia Mother   . Heart disease Mother 28       dec  . Hypertension Father   . Hyperlipidemia Father   . Heart disease Father 56       smoked for 10 yrs., no muscle damage, 1 MI   . Pulmonary fibrosis Father        diagnosed end stage, cause unkn.  Marland Kitchen Urolithiasis Father   . Hyperlipidemia Brother   . Urolithiasis Brother   . Diabetes Son   . Asthma Son   . Seizures Son   . Arthritis Maternal Grandmother   . Cervical cancer Maternal Grandmother        diagnosed in her 58s or 75s  . Arthritis Paternal Grandmother   . Asthma Son   . Diverticulosis Sister   . Breast cancer Sister 49  . Colon cancer Neg Hx   . Colon polyps Neg Hx   . Esophageal cancer Neg Hx   . Rectal cancer Neg Hx   . Stomach cancer Neg Hx     Social History   Socioeconomic History  . Marital status: Married    Spouse name: Phillip Heal  . Number of children: 2  . Years of education: Masters  . Highest education level: Not on file  Occupational History  . Occupation: Pharmacist, hospital, reading specialist k-6    Comment: Retired  Tobacco Use  . Smoking status: Never Smoker  . Smokeless tobacco: Never Used  Vaping Use  . Vaping Use: Never used  Substance and Sexual Activity  . Alcohol use: Yes    Alcohol/week: 0.0 standard drinks    Comment: 1 glass of wine/month  . Drug use: No  . Sexual activity: Yes    Partners: Male    Birth control/protection: Surgical    Comment: hysterectomy--still has ovaries  Other Topics Concern  . Not on file  Social History Narrative   Relocated to [facility] [place] from [place]. Due to husband job transfer. 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 She lives with her husband. She has 2 grown sons- oldest recently graduated from college and took a job in [place].; youngest just graduated HS & ia attending  [place].      Has had regular preventive  care in [place]. At [facility] w/Dr. Alfonse Spruce. Only health concern is insomnia that developed about 5 years ago & has been treated with [place]. She has not practiced sleep hygiene.      Occasionally drinks sweet tea.    Social Determinants of Health   Financial Resource Strain: Not on file  Food Insecurity: Not on file  Transportation Needs: Not on file  Physical Activity: Not on file  Stress: Not on file  Social Connections: Not on file  Intimate Partner Violence: Not on file    Review of Systems  Skin:       Vaginal itching  All other systems reviewed and are negative.   PHYSICAL EXAMINATION:    BP 130/68   Pulse 88   Ht 5' 4.25" (1.632 m)   Wt 161 lb (73 kg)   LMP 07/30/2009 (Within Years)   SpO2 97%   BMI 27.42 kg/m     General appearance: alert, cooperative and appears stated age  Pelvic: External genitalia:  no lesions.  Small cherry hemangioma of left labia majora.              Urethra:  normal appearing urethra with no masses, tenderness or lesions              Bartholins and Skenes: normal                 Vagina: normal appearing vagina with normal color and discharge, no lesions              Cervix: absent                Bimanual Exam:  Uterus:  absent              Adnexa: no mass, fullness, tenderness        Chaperone was present for exam.  ASSESSMENT  Chronic vulvitis.  Some relief with Mycolog II.  On Tamoxifen due to increased risk of breast cancer.  Cherry hemangioma.  PLAN  Wet prep:  Yeast present.  No trich or clue cells.  Diflucan 150 mg po.  May repeat in 72 hour prn.  Triamcinolone ointment 0.5% to area bid for one week at a time.  May used twice weekly for maintenance dosing.  Reassurance regarding cherry hemangioma, which do not need removal. Fu prn.   26 min  total time was spent for this patient encounter, including preparation, face-to-face counseling with the patient, coordination of  care, and documentation of the encounter.

## 2020-10-05 IMAGING — MG DIGITAL SCREENING BILAT W/ TOMO W/ CAD
8 series · 8 of 24 positions shown · non-contrast
Comparison: Previous exam(s).

CLINICAL DATA: Screening.

EXAM:
DIGITAL SCREENING BILATERAL MAMMOGRAM WITH TOMO AND CAD

[L MLO synth-2D]
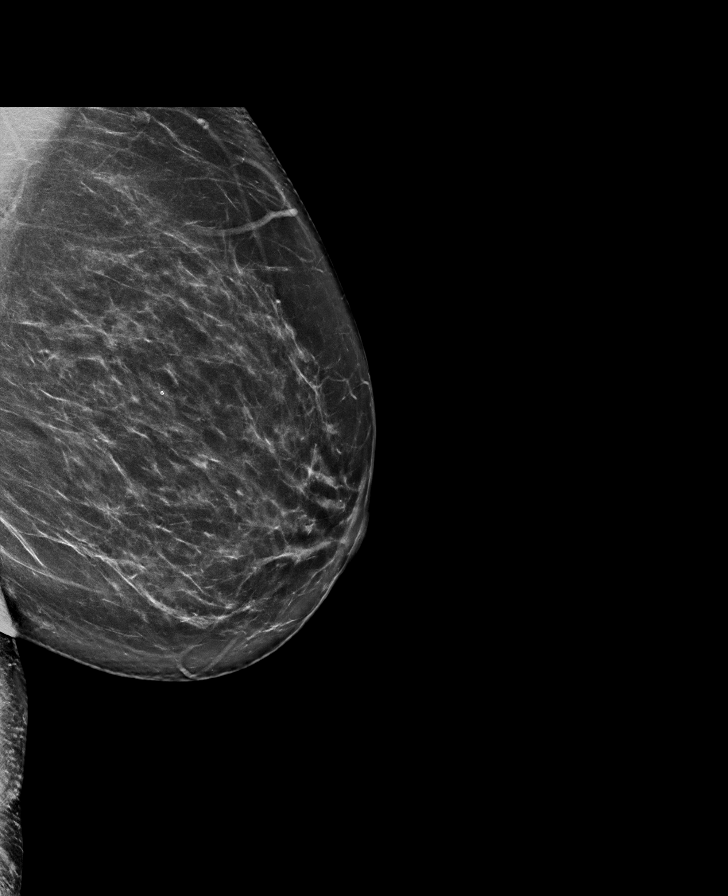

[R CC synth-2D]
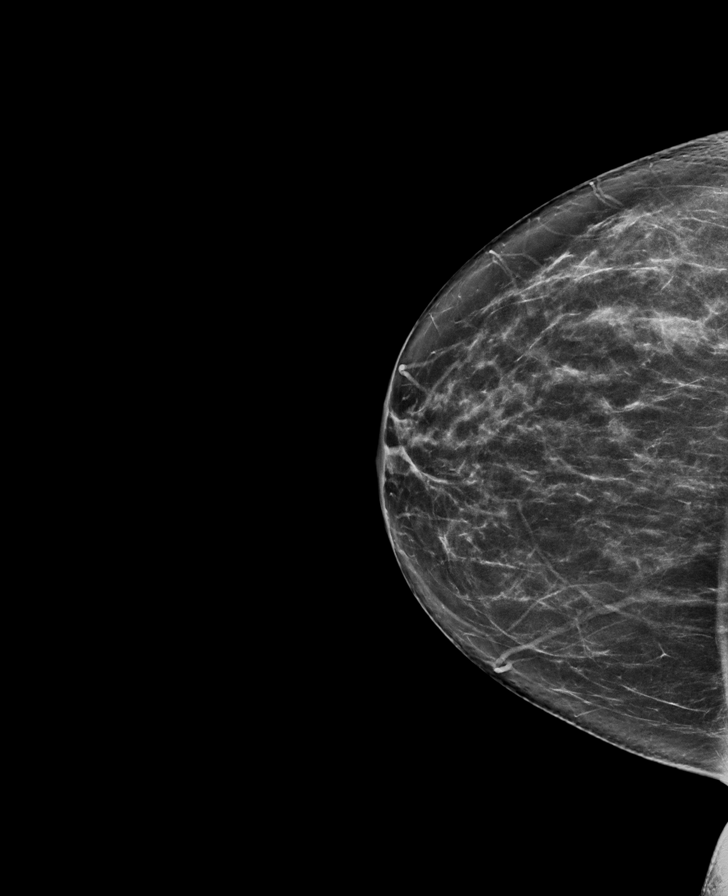

[L CC synth-2D]
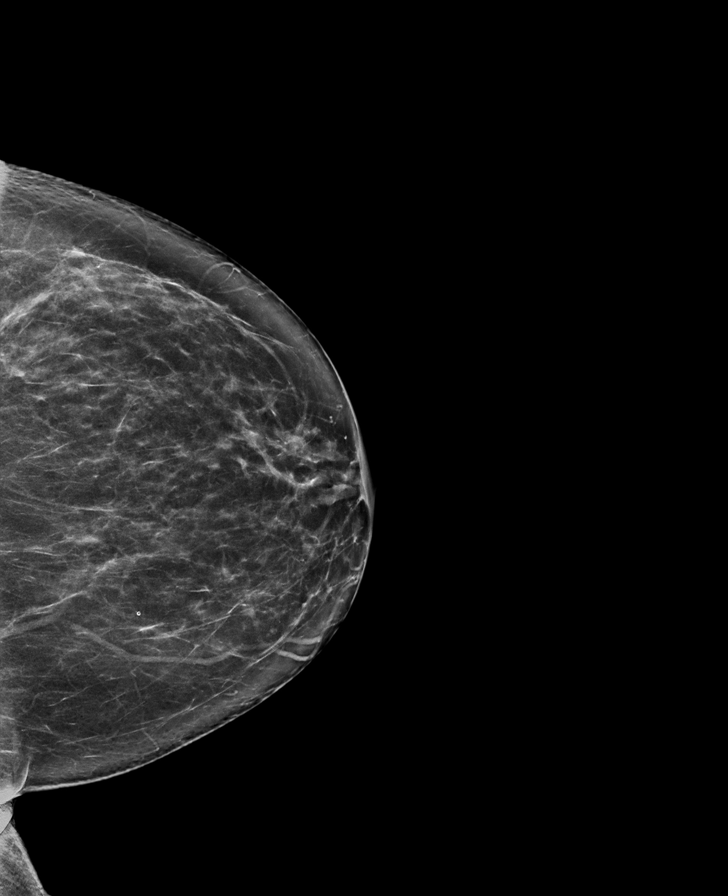

[R MLO synth-2D]
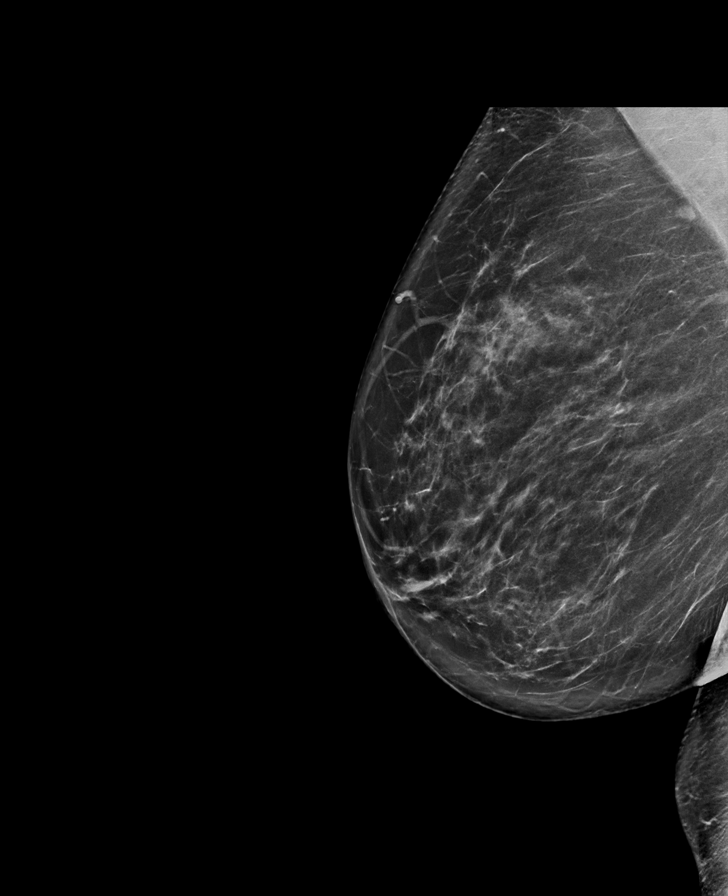

[L MLO tomo · tomo slice 43/84.0]
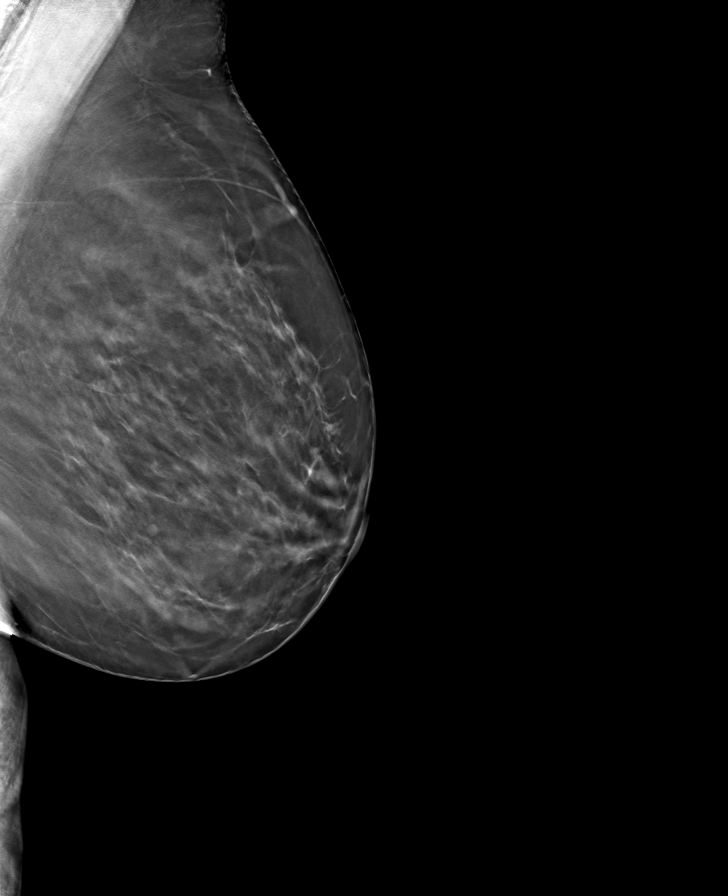

[R CC tomo · tomo slice 39/76.0]
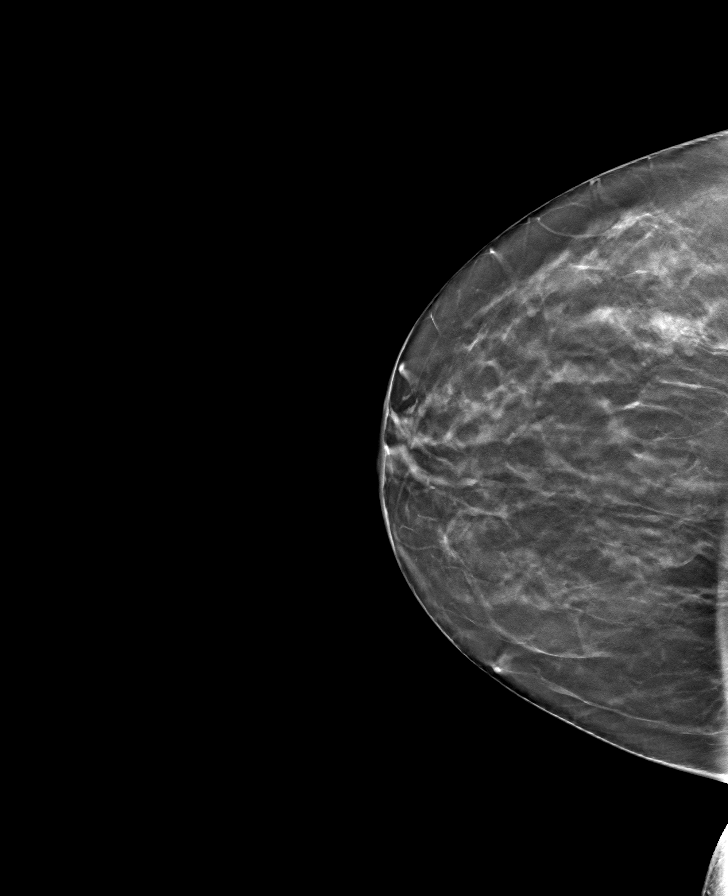

[R MLO tomo · tomo slice 43/86.0]
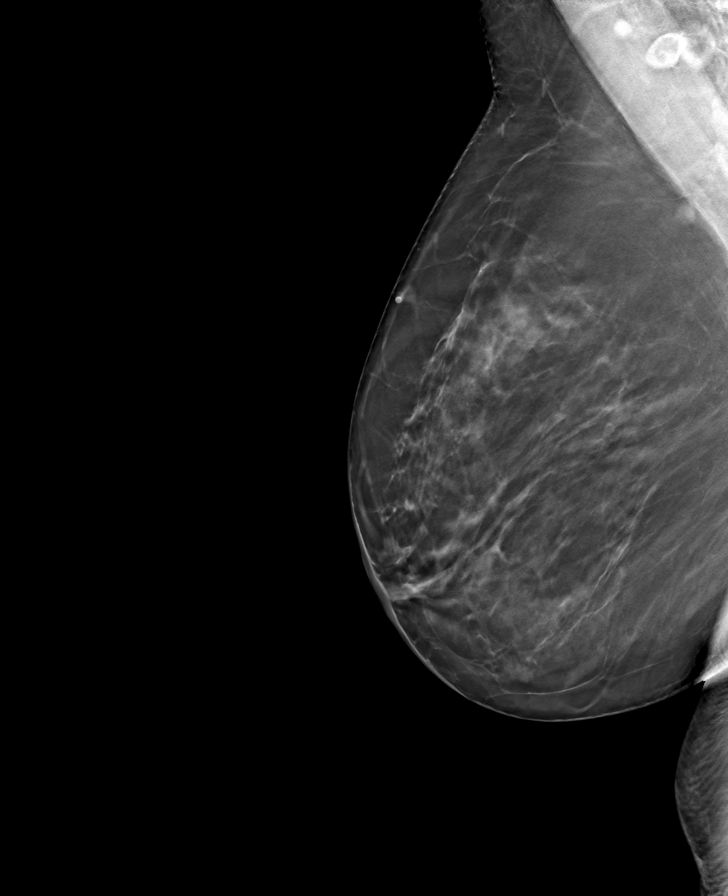

[L CC tomo · tomo slice 39/78.0]
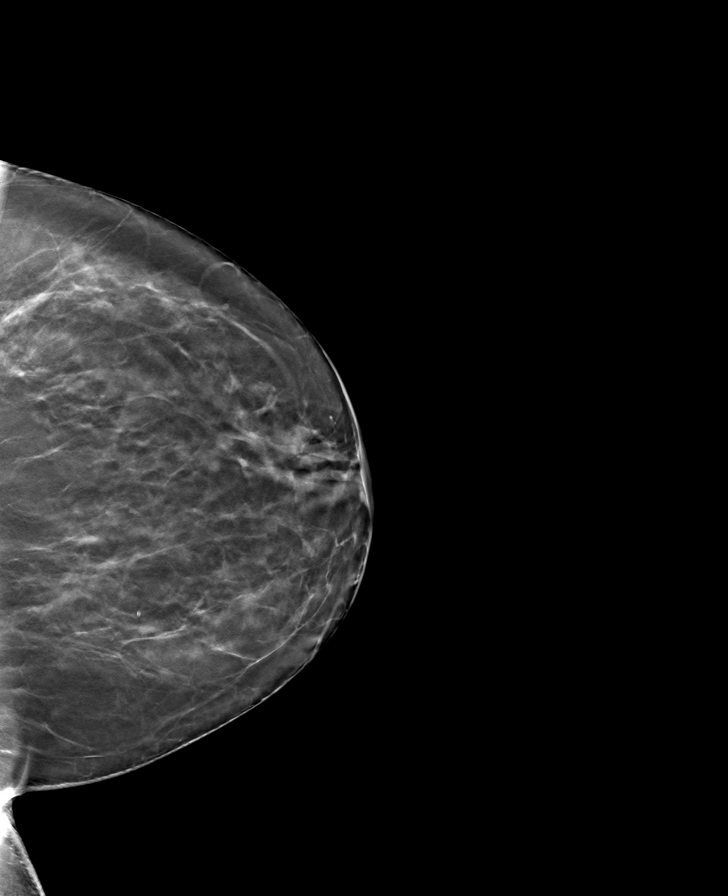

[8 of 24 positions shown; findings below may reference images not displayed]

ACR Breast Density Category b: There are scattered areas of
fibroglandular density.
FINDINGS: There are no findings suspicious for malignancy. Images were
processed with CAD.
IMPRESSION: No mammographic evidence of malignancy. A result letter of this
screening mammogram will be mailed directly to the patient.

RECOMMENDATION:
Screening mammogram in one year. (Code:[id number])

BI-RADS CATEGORY  1: Negative.

## 2020-10-11 ENCOUNTER — Other Ambulatory Visit: Payer: Self-pay

## 2020-10-11 DIAGNOSIS — K219 Gastro-esophageal reflux disease without esophagitis: Secondary | ICD-10-CM

## 2020-10-11 MED ORDER — PANTOPRAZOLE SODIUM 20 MG PO TBEC: 20.0000 mg | DELAYED_RELEASE_TABLET | Freq: Every day | ORAL | 0 refills | Status: DC

## 2020-10-31 ENCOUNTER — Ambulatory Visit (INDEPENDENT_AMBULATORY_CARE_PROVIDER_SITE_OTHER): Payer: BC Managed Care – PPO | Admitting: Family Medicine

## 2020-10-31 ENCOUNTER — Other Ambulatory Visit: Payer: Self-pay

## 2020-10-31 ENCOUNTER — Encounter: Payer: Self-pay | Admitting: Family Medicine

## 2020-10-31 VITALS — BP 122/84 | HR 82 | Temp 97.9°F | Ht 64.0 in | Wt 159.0 lb

## 2020-10-31 DIAGNOSIS — E611 Iron deficiency: Secondary | ICD-10-CM

## 2020-10-31 DIAGNOSIS — Z0000 Encounter for general adult medical examination without abnormal findings: Secondary | ICD-10-CM | POA: Diagnosis not present

## 2020-10-31 DIAGNOSIS — Z803 Family history of malignant neoplasm of breast: Secondary | ICD-10-CM

## 2020-10-31 DIAGNOSIS — E782 Mixed hyperlipidemia: Secondary | ICD-10-CM | POA: Diagnosis not present

## 2020-10-31 DIAGNOSIS — G4700 Insomnia, unspecified: Secondary | ICD-10-CM

## 2020-10-31 DIAGNOSIS — E559 Vitamin D deficiency, unspecified: Secondary | ICD-10-CM

## 2020-10-31 DIAGNOSIS — Z5181 Encounter for therapeutic drug level monitoring: Secondary | ICD-10-CM

## 2020-10-31 DIAGNOSIS — Z9189 Other specified personal risk factors, not elsewhere classified: Secondary | ICD-10-CM | POA: Diagnosis not present

## 2020-10-31 DIAGNOSIS — Z79899 Other long term (current) drug therapy: Secondary | ICD-10-CM | POA: Diagnosis not present

## 2020-10-31 DIAGNOSIS — K219 Gastro-esophageal reflux disease without esophagitis: Secondary | ICD-10-CM | POA: Diagnosis not present

## 2020-10-31 DIAGNOSIS — R7303 Prediabetes: Secondary | ICD-10-CM | POA: Diagnosis not present

## 2020-10-31 DIAGNOSIS — M8580 Other specified disorders of bone density and structure, unspecified site: Secondary | ICD-10-CM | POA: Diagnosis not present

## 2020-10-31 LAB — COMPREHENSIVE METABOLIC PANEL
ALT: 15 U/L (ref 0–35)
AST: 13 U/L (ref 0–37)
Albumin: 4.7 g/dL (ref 3.5–5.2)
Alkaline Phosphatase: 31 U/L — ABNORMAL LOW (ref 39–117)
BUN: 27 mg/dL — ABNORMAL HIGH (ref 6–23)
CO2: 26 mEq/L (ref 19–32)
Calcium: 10.3 mg/dL (ref 8.4–10.5)
Chloride: 103 mEq/L (ref 96–112)
Creatinine, Ser: 1.03 mg/dL (ref 0.40–1.20)
GFR: 58.72 mL/min — ABNORMAL LOW (ref >60.00)
Glucose, Bld: 104 mg/dL — ABNORMAL HIGH (ref 70–99)
Potassium: 4.4 mEq/L (ref 3.5–5.1)
Sodium: 138 mEq/L (ref 135–145)
Total Bilirubin: 0.3 mg/dL (ref 0.2–1.2)
Total Protein: 7.2 g/dL (ref 6.0–8.3)

## 2020-10-31 LAB — VITAMIN D 25 HYDROXY (VIT D DEFICIENCY, FRACTURES): VITD: 52.15 ng/mL (ref 30.00–100.00)

## 2020-10-31 LAB — LDL CHOLESTEROL, DIRECT: Direct LDL: 90 mg/dL

## 2020-10-31 LAB — TSH: TSH: 2.88 u[IU]/mL (ref 0.35–4.50)

## 2020-10-31 LAB — HEMOGLOBIN A1C: Hgb A1c MFr Bld: 5.7 % (ref 4.6–6.5)

## 2020-10-31 LAB — LIPID PANEL
Cholesterol: 165 mg/dL (ref 0–200)
HDL: 41.7 mg/dL (ref >39.00)
NonHDL: 123.04
Total CHOL/HDL Ratio: 4
Triglycerides: 243 mg/dL — ABNORMAL HIGH (ref 0.0–149.0)
VLDL: 48.6 mg/dL — ABNORMAL HIGH (ref 0.0–40.0)

## 2020-10-31 LAB — MAGNESIUM: Magnesium: 2.1 mg/dL (ref 1.5–2.5)

## 2020-10-31 LAB — VITAMIN B12: Vitamin B-12: 570 pg/mL (ref 211–911)

## 2020-10-31 MED ORDER — PRAVASTATIN SODIUM 20 MG PO TABS: 20.0000 mg | ORAL_TABLET | Freq: Every day | ORAL | 3 refills | Status: DC

## 2020-10-31 MED ORDER — BACLOFEN 10 MG PO TABS: 10.0000 mg | ORAL_TABLET | Freq: Two times a day (BID) | ORAL | 1 refills | Status: DC

## 2020-10-31 MED ORDER — PANTOPRAZOLE SODIUM 20 MG PO TBEC
20.0000 mg | DELAYED_RELEASE_TABLET | Freq: Every day | ORAL | 3 refills | Status: DC
Start: 2020-10-31 — End: 2021-05-02

## 2020-10-31 MED ORDER — ZOLPIDEM TARTRATE 5 MG PO TABS: 2.5000 mg | ORAL_TABLET | Freq: Every evening | ORAL | 1 refills | Status: DC | PRN

## 2020-10-31 NOTE — Progress Notes (Signed)
This visit occurred during the SARS-CoV-2 public health emergency.  Safety protocols were in place, including screening questions prior to the visit, additional usage of staff PPE, and extensive cleaning of exam room while observing appropriate contact time as indicated for disinfecting solutions.    Patient ID: [name], female  DOB: [birth year], 61 y.o.   MRN: [id number] Patient Care Team    Relationship Specialty Notifications Start End  Ma Hillock, DO PCP - General Family Medicine  03/29/15   Nunzio Cobbs, MD Consulting Physician Obstetrics and Gynecology  08/07/60   Clint Guy Counselor Genetic Counselor  10/29/19   Mauri Pole, MD Consulting Physician Gastroenterology  04/13/20     Chief Complaint  Patient presents with  . Annual Exam    Pt is fasting     Subjective:  [name] is a 60 y.o.  Female  present for CPE/cmc. All past medical history, surgical history, allergies, family history, immunizations, medications and social history were updated in the electronic medical record today. All recent labs, ED visits and hospitalizations within the last year were reviewed.  Health maintenance:  Colonoscopy: completed 12/03/2019,10-year follow-up. Dr. Silverio Decamp Mammogram: completed:07/2020. Dr. Quincy Simmonds orders. [facility]. Cervical cancer screening:Hysterectomy. Followed by Dr. Quincy Simmonds. Immunizations: tdapUTD 2014, InfluenzaUTD 2021(encouraged yearly), Shingrix series completed. Covid series completed w/ booster. Infectious disease screening: HIVand hep Ccompleted  DEXA: last completed12/05/2017, result-1.1 osteopenic,follow up 10 yrs. followed by Dr. Quincy Simmonds Assistive device: none Oxygen IWL:NLGX Patient has a Dental home. Hospitalizations/ED visits: reviewed  Insomnia/neck pain Patient reports insomnia is well controlled on Ambien 2.5-5 mg daily at bedtime. She has been on this medication since ~2012. She  reports she routinely needs this medication- sometimes she takes a half tab.. She deniesnegative side effects. She reports she has continued to use the baclofen BID for neck pain and feels it has been helpful.   Mixed hyperlipidemia/On statin therapy/Family history of premature coronary artery disease Tolerating statin and fish oil 1000 mg QD.  Complaint w/ pravastatin 20 mg a day. Last lipid panel 04/2019 WNL. Pt is fasting.  Prediabetes HgB A1c6.1>>5.8> 5.8> 5.7 last check. Exercising daily again  Depression screen [facility] 2/9 10/31/2020 05/04/2019 05/22/2018 10/30/2017 05/17/2017  Decreased Interest 0 0 0 0 0  Down, Depressed, Hopeless 0 - 0 0 0  PHQ - 2 Score 0 0 0 0 0   No flowsheet data found.   Immunization History  Administered Date(s) Administered  . Influenza Inj Mdck Quad Pf 04/18/2018  . Influenza, Quadrivalent, Recombinant, Inj, Pf 04/30/2019  . Influenza,inj,Quad PF,6+ Mos 05/17/2017, 04/13/2020  . Influenza-Unspecified 06/20/2015, 05/25/2016, 04/30/2019  . PFIZER(Purple Top)SARS-COV-2 Vaccination 10/03/2019, 10/24/2019, 06/18/2020  . PPD Test 03/16/2014  . Tdap 04/03/2013  . Zoster Recombinat (Shingrix) 10/30/2017, 01/27/2018     Past Medical History:  Diagnosis Date  . Anemia    PAST HX- ON IRON   . Arthritis    neck  . Cataract    BEGINNING   . COVID-19 virus infection 05/2019  . Diverticulosis of colon    MILD  . Elevated cholesterol with elevated triglycerides   . Eustachian tube dysfunction, left 05/22/2018  . Family history of breast cancer   . Family history of cervical cancer   . GERD (gastroesophageal reflux disease)   . History of rectocele   . Insomnia    started ambien 5 ya.  . Osteopenia    --left femur  . Periodic heart flutter   .  SVD (spontaneous vaginal delivery)    x 2  . Urinary incontinence    Allergies  Allergen Reactions  . Amoxicillin Hives  . Sulfa Antibiotics Rash    Childhood rxn   Past Surgical History:  Procedure  Laterality Date  . BLADDER SUSPENSION N/A 11/10/2013   Procedure: TRANSVAGINAL TAPE (TVT) PROCEDURE;  Surgeon: Jamey Reas de Berton Lan, MD;  Location: [facility];  Service: Gynecology;  Laterality: N/A;  . COLONOSCOPY  2011   [place] [facility]   . CYSTOSCOPY N/A 11/10/2013   Procedure: CYSTOSCOPY;  Surgeon: Jamey Reas de Berton Lan, MD;  Location: [facility];  Service: Gynecology;  Laterality: N/A;  . IRRIGATION AND DEBRIDEMENT SEBACEOUS CYST  03/2017  . VAGINAL HYSTERECTOMY  11/11    Vag cuff, mild rectocele  . WISDOM TOOTH EXTRACTION     Family History  Problem Relation Age of Onset  . Arthritis Mother   . Hypertension Mother   . Hyperlipidemia Mother   . Heart disease Mother 93       dec  . Hypertension Father   . Hyperlipidemia Father   . Heart disease Father 19       smoked for 10 yrs., no muscle damage, 1 MI   . Pulmonary fibrosis Father        diagnosed end stage, cause unkn.  Marland Kitchen Urolithiasis Father   . Hyperlipidemia Brother   . Urolithiasis Brother   . Diabetes Son   . Asthma Son   . Seizures Son   . Arthritis Maternal Grandmother   . Cervical cancer Maternal Grandmother        diagnosed in her 58s or 90s  . Arthritis Paternal Grandmother   . Asthma Son   . Diverticulosis Sister   . Breast cancer Sister 75  . Colon cancer Neg Hx   . Colon polyps Neg Hx   . Esophageal cancer Neg Hx   . Rectal cancer Neg Hx   . Stomach cancer Neg Hx    Social History   Social History Narrative   Relocated to [place] from [place]. Due to husband job transfer. 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 She lives with her husband. She has 2 grown sons- oldest recently graduated from college and took a job in [place].; youngest just graduated HS & ia attending [place].      Has had regular preventive care in [place]. At [facility] w/Dr. Alfonse Spruce. Only health concern is insomnia that developed  about 5 years ago & has been treated with [place]. She has not practiced sleep hygiene.      Occasionally drinks sweet tea.     Allergies as of 10/31/2020      Reactions   Amoxicillin Hives   Sulfa Antibiotics Rash   Childhood rxn      Medication List       Accurate as of October 31, 2020  8:35 AM. If you have any questions, ask your nurse or doctor.        STOP taking these medications   nystatin-triamcinolone cream Commonly known as: MYCOLOG II Stopped by: Howard Pouch, DO     TAKE these medications   baclofen 10 MG tablet Commonly known as: LIORESAL Take 1 tablet (10 mg total) by mouth 2 (two) times daily.   Caltrate 600+D Plus Minerals 600-800 MG-UNIT Tabs Take by mouth.   Fusion Plus Caps Take  1 capsule by mouth daily.   Omega 3 1200 MG Caps Take 2 capsules by mouth.   pantoprazole 20 MG tablet Commonly known as: PROTONIX Take 1 tablet (20 mg total) by mouth daily.   pravastatin 20 MG tablet Commonly known as: PRAVACHOL Take 1 tablet (20 mg total) by mouth daily.   spironolactone 50 MG tablet Commonly known as: ALDACTONE Take 50 mg by mouth 2 (two) times daily. What changed: Another medication with the same name was removed. Continue taking this medication, and follow the directions you see here. Changed by: Howard Pouch, DO   tamoxifen 20 MG tablet Commonly known as: NOLVADEX Take 20 mg by mouth daily.   triamcinolone ointment 0.5 % Commonly known as: KENALOG Apply 1 application topically 2 (two) times daily. Use for one week at a time.  May use this twice weekly to control symptoms. What changed:   when to take this  reasons to take this   zolpidem 5 MG tablet Commonly known as: AMBIEN Take 0.5-1 tablets (2.5-5 mg total) by mouth at bedtime as needed for sleep.       All past medical history, surgical history, allergies, family history, immunizations andmedications were updated in the EMR today and reviewed under the history and medication  portions of their EMR.       MM 3D SCREEN BREAST BILATERAL  Result Date: 08/03/2020 CLINICAL DATA:  Screening. EXAM: DIGITAL SCREENING BILATERAL MAMMOGRAM WITH TOMO AND CAD COMPARISON:  Previous exam(s). ACR Breast Density Category b: There are scattered areas of fibroglandular density. FINDINGS: There are no findings suspicious for malignancy. Images were processed with CAD. IMPRESSION: No mammographic evidence of malignancy. A result letter of this screening mammogram will be mailed directly to the patient. RECOMMENDATION: Screening mammogram in one year. (Code:SM-B-01Y) BI-RADS CATEGORY  1: Negative. Electronically Signed   By: Kristopher Oppenheim M.D.   On: 08/03/2020 08:41     ROS: 14 pt review of systems performed and negative (unless mentioned in an HPI)  Objective: BP 122/84   Pulse 82   Temp 97.9 F (36.6 C) (Oral)   Ht 5\' 4"  (1.626 m)   Wt 159 lb (72.1 kg)   LMP 07/30/2009 (Within Years)   SpO2 98%   BMI 27.29 kg/m  Gen: Afebrile. No acute distress. Nontoxic in appearance, well-developed, well-nourished,  Very pleasant female.  HENT: AT. [place]. Bilateral TM visualized and normal in appearance, normal external auditory canal. MMM, no oral lesions, adequate dentition. Bilateral nares within normal limits. Throat without erythema, ulcerations or exudates. no Cough on exam, no hoarseness on exam. Eyes:Pupils Equal Round Reactive to light, Extraocular movements intact,  Conjunctiva without redness, discharge or icterus. Neck/lymp/endocrine: Supple,no lymphadenopathy, no thyromegaly CV: RRR no murmur, no edema, +2/4 P posterior tibialis pulses.  Chest: CTAB, no wheeze, rhonchi or crackles. normal Respiratory effort. good Air movement. Abd: Soft. flat. NTND. BS present. no Masses palpated. No hepatosplenomegaly. No rebound tenderness or guarding. Skin: no rashes, purpura or petechiae. Warm and well-perfused. Skin intact. Neuro/Msk:  Normal gait. PERLA. EOMi. Alert. Oriented x3.  Cranial  nerves II through XII intact. Muscle strength 5/5 upper/lower extremity. DTRs equal bilaterally. Psych: Normal affect, dress and demeanor. Normal speech. Normal thought content and judgment.   No exam data present  Assessment/plan: [name] is a 61 y.o. female present for CPE/cmc Gastroesophageal reflux disease without esophagitis/long term PPI Stable Continue  Protonix. She takes QOD if possible and QD if flares . Vit d, b12, mag collected today  Insomnia, unspecified type Stable\- tsh collected today - continue  ambien 2.5-5 mg QHS today.  - [facility] database reviewed04/04/22 - Contract signed.  - F/U 6 monthor when needing refills.   Mixed hyperlipidemia/On statin therapy/Family history of premature coronary artery disease stable - She has made great dietary and exercise changes.  -continue pravastatin. -continue fish oil 2400 mg QD - cmp, tsh, lipids collected today   Prediabetes - last A1c 6.1--> 5.8-->5.8>>5.7 - monitor yearly as long as < 5.9 and fbg normal - cmp, a1c collected tpday  Vitamin D deficiency/Osteopenia - taking her daily supplement Bone density UTD through GYN Vit d collected tpday  Iron deficiency: Taking daily iron.  - iron panel collected today  At high risk for breast cancer/Family history of breast cancer Followed by gyn  Routine general medical examination at a health care facility Patient was encouraged to exercise greater than 150 minutes a week. Patient was encouraged to choose a diet filled with fresh fruits and vegetables, and lean meats. AVS provided to patient today for education/recommendation on gender specific health and safety maintenance. Colonoscopy: completed 12/03/2019,10-year follow-up. Dr. Silverio Decamp Mammogram: completed:07/2020. Dr. Quincy Simmonds orders. [facility]. Cervical cancer screening:Hysterectomy. Followed by Dr. Quincy Simmonds. Immunizations: tdapUTD 2014, InfluenzaUTD  2021(encouraged yearly), Shingrix series completed. Covid series completed w/ booster. Infectious disease screening: HIVand hep Ccompleted  DEXA: last completed12/05/2017, result-1.1 osteopenic,follow up 10 yrs. followed by Dr. Quincy Simmonds Return in about 6 months (around 05/02/2021) for [facility] (30 min) and 1 yr cpe.    Orders Placed This Encounter  Procedures  . VITAMIN D 25 Hydroxy (Vit-D Deficiency, Fractures)  . Comprehensive metabolic panel  . Hemoglobin A1c  . Lipid panel  . TSH  . Iron, TIBC and Ferritin Panel  . B12  . Magnesium   Meds ordered this encounter  Medications  . pantoprazole (PROTONIX) 20 MG tablet    Sig: Take 1 tablet (20 mg total) by mouth daily.    Dispense:  90 tablet    Refill:  3  . pravastatin (PRAVACHOL) 20 MG tablet    Sig: Take 1 tablet (20 mg total) by mouth daily.    Dispense:  90 tablet    Refill:  3  . baclofen (LIORESAL) 10 MG tablet    Sig: Take 1 tablet (10 mg total) by mouth 2 (two) times daily.    Dispense:  180 each    Refill:  1  . zolpidem (AMBIEN) 5 MG tablet    Sig: Take 0.5-1 tablets (2.5-5 mg total) by mouth at bedtime as needed for sleep.    Dispense:  90 tablet    Refill:  1   Referral Orders  No referral(s) requested today     Electronically signed by: Howard Pouch, [place]

## 2020-10-31 NOTE — Patient Instructions (Signed)
Health Maintenance, Female Adopting a healthy lifestyle and getting preventive care are important in promoting health and wellness. Ask your health care provider about:  The right schedule for you to have regular tests and exams.  Things you can do on your own to prevent diseases and keep yourself healthy. What should I know about diet, weight, and exercise? Eat a healthy diet  Eat a diet that includes plenty of vegetables, fruits, low-fat dairy products, and lean protein.  Do not eat a lot of foods that are high in solid fats, added sugars, or sodium.   Maintain a healthy weight Body mass index (BMI) is used to identify weight problems. It estimates body fat based on height and weight. Your health care provider can help determine your BMI and help you achieve or maintain a healthy weight. Get regular exercise Get regular exercise. This is one of the most important things you can do for your health. Most adults should:  Exercise for at least 150 minutes each week. The exercise should increase your heart rate and make you sweat (moderate-intensity exercise).  Do strengthening exercises at least twice a week. This is in addition to the moderate-intensity exercise.  Spend less time sitting. Even light physical activity can be beneficial. Watch cholesterol and blood lipids Have your blood tested for lipids and cholesterol at 62 years of age, then have this test every 5 years. Have your cholesterol levels checked more often if:  Your lipid or cholesterol levels are high.  You are older than 62 years of age.  You are at high risk for heart disease. What should I know about cancer screening? Depending on your health history and family history, you may need to have cancer screening at various ages. This may include screening for:  Breast cancer.  Cervical cancer.  Colorectal cancer.  Skin cancer.  Lung cancer. What should I know about heart disease, diabetes, and high blood  pressure? Blood pressure and heart disease  High blood pressure causes heart disease and increases the risk of stroke. This is more likely to develop in people who have high blood pressure readings, are of African descent, or are overweight.  Have your blood pressure checked: ? Every 3-5 years if you are 18-39 years of age. ? Every year if you are 40 years old or older. Diabetes Have regular diabetes screenings. This checks your fasting blood sugar level. Have the screening done:  Once every three years after age 40 if you are at a normal weight and have a low risk for diabetes.  More often and at a younger age if you are overweight or have a high risk for diabetes. What should I know about preventing infection? Hepatitis B If you have a higher risk for hepatitis B, you should be screened for this virus. Talk with your health care provider to find out if you are at risk for hepatitis B infection. Hepatitis C Testing is recommended for:  Everyone born from 1945 through 1965.  Anyone with known risk factors for hepatitis C. Sexually transmitted infections (STIs)  Get screened for STIs, including gonorrhea and chlamydia, if: ? You are sexually active and are younger than 62 years of age. ? You are older than 62 years of age and your health care provider tells you that you are at risk for this type of infection. ? Your sexual activity has changed since you were last screened, and you are at increased risk for chlamydia or gonorrhea. Ask your health care provider   if you are at risk.  Ask your health care provider about whether you are at high risk for HIV. Your health care provider may recommend a prescription medicine to help prevent HIV infection. If you choose to take medicine to prevent HIV, you should first get tested for HIV. You should then be tested every 3 months for as long as you are taking the medicine. Pregnancy  If you are about to stop having your period (premenopausal) and  you may become pregnant, seek counseling before you get pregnant.  Take 400 to 800 micrograms (mcg) of folic acid every day if you become pregnant.  Ask for birth control (contraception) if you want to prevent pregnancy. Osteoporosis and menopause Osteoporosis is a disease in which the bones lose minerals and strength with aging. This can result in bone fractures. If you are 65 years old or older, or if you are at risk for osteoporosis and fractures, ask your health care provider if you should:  Be screened for bone loss.  Take a calcium or vitamin D supplement to lower your risk of fractures.  Be given hormone replacement therapy (HRT) to treat symptoms of menopause. Follow these instructions at home: Lifestyle  Do not use any products that contain nicotine or tobacco, such as cigarettes, e-cigarettes, and chewing tobacco. If you need help quitting, ask your health care provider.  Do not use street drugs.  Do not share needles.  Ask your health care provider for help if you need support or information about quitting drugs. Alcohol use  Do not drink alcohol if: ? Your health care provider tells you not to drink. ? You are pregnant, may be pregnant, or are planning to become pregnant.  If you drink alcohol: ? Limit how much you use to 0-1 drink a day. ? Limit intake if you are breastfeeding.  Be aware of how much alcohol is in your drink. In the [place] one drink equals one 12 oz bottle of beer (355 mL), one 5 oz glass of wine (148 mL), or one 1 oz glass of hard liquor (44 mL). General instructions  Schedule regular health, dental, and eye exams.  Stay current with your vaccines.  Tell your health care provider if: ? You often feel depressed. ? You have ever been abused or do not feel safe at home. Summary  Adopting a healthy lifestyle and getting preventive care are important in promoting health and wellness.  Follow your health care provider's instructions about healthy  diet, exercising, and getting tested or screened for diseases.  Follow your health care provider's instructions on monitoring your cholesterol and blood pressure. This information is not intended to replace advice given to you by your health care provider. Make sure you discuss any questions you have with your health care provider. Document Revised: 07/09/2018 Document Reviewed: 07/09/2018 Elsevier Patient Education  2021 Elsevier Inc.  

## 2020-11-01 ENCOUNTER — Telehealth: Payer: Self-pay | Admitting: Family Medicine

## 2020-11-01 LAB — IRON,TIBC AND FERRITIN PANEL
%SAT: 29 % (calc) (ref 16–45)
Ferritin: 259 ng/mL (ref 16–288)
Iron: 94 ug/dL (ref 45–160)
TIBC: 319 mcg/dL (calc) (ref 250–450)

## 2020-11-01 MED ORDER — FENOFIBRATE 145 MG PO TABS: 145.0000 mg | ORAL_TABLET | Freq: Every day | ORAL | 3 refills | Status: DC

## 2020-11-01 NOTE — Telephone Encounter (Signed)
Please inform patient, fenofibrate prescribed.  We will recheck her cholesterol levels fasting in 5 and half months at her follow-up appointment

## 2020-11-01 NOTE — Addendum Note (Signed)
Addended by: Howard Pouch A on: 11/01/2020 04:28 PM   Modules accepted: Orders

## 2020-11-01 NOTE — Telephone Encounter (Signed)
Please call patient Liver, kidney and thyroid function are stable Iron panel was normal-continue supplementation Vitamin D levels are normal-continue vitamin D supplementation.  Her calcium is in the high normal at 10.3 if she is ill taking a calcium supplement I would encourage her to decrease the supplement. B12 levels are normal Diabetes screening/A1c is normal  Cholesterol panel overall looks great except for her triglycerides.  Her triglycerides are 243 fasting and goal is less than 150.  There is a fiber-based medicine that can decrease her triglycerides.  Is called fenofibrate, if she is interested in starting this we can call this into our pharmacy today to get her started.  -Would also encourage her to start Metamucil 2 servings daily, taking  fish oil supplement of 3600 mg a day, decreasing saturated fats and increasing her exercise.

## 2020-11-01 NOTE — Telephone Encounter (Signed)
Pt informed

## 2020-11-01 NOTE — Telephone Encounter (Signed)
Spoke with pt regarding labs and instructions. Pt agreed to medication. Pt was encouraged to stop Cal-vit d BID and to start QD and to buy vit d at same units QD. Pt verbalized understanding

## 2021-01-19 ENCOUNTER — Encounter: Payer: Self-pay | Admitting: Family Medicine

## 2021-01-31 NOTE — Telephone Encounter (Signed)
FYI. Pt coming in tomorrow

## 2021-02-01 ENCOUNTER — Encounter: Payer: Self-pay | Admitting: Family Medicine

## 2021-02-01 ENCOUNTER — Other Ambulatory Visit: Payer: Self-pay

## 2021-02-01 ENCOUNTER — Ambulatory Visit (INDEPENDENT_AMBULATORY_CARE_PROVIDER_SITE_OTHER): Payer: BC Managed Care – PPO | Admitting: Family Medicine

## 2021-02-01 VITALS — BP 109/71 | HR 83 | Temp 98.7°F | Ht 64.0 in | Wt 149.0 lb

## 2021-02-01 DIAGNOSIS — Z79899 Other long term (current) drug therapy: Secondary | ICD-10-CM

## 2021-02-01 DIAGNOSIS — G4700 Insomnia, unspecified: Secondary | ICD-10-CM

## 2021-02-01 DIAGNOSIS — E782 Mixed hyperlipidemia: Secondary | ICD-10-CM

## 2021-02-01 DIAGNOSIS — Z8249 Family history of ischemic heart disease and other diseases of the circulatory system: Secondary | ICD-10-CM | POA: Diagnosis not present

## 2021-02-01 DIAGNOSIS — R7303 Prediabetes: Secondary | ICD-10-CM | POA: Diagnosis not present

## 2021-02-01 MED ORDER — ATORVASTATIN CALCIUM 40 MG PO TABS: 40.0000 mg | ORAL_TABLET | Freq: Every day | ORAL | 3 refills | Status: DC

## 2021-02-01 NOTE — Patient Instructions (Signed)
Atherosclerosis  Atherosclerosis is narrowing and hardening of the arteries. Arteries are blood vessels that carry blood from the heart to all parts of the body. This blood contains oxygen. Arteries can become narrow or blocked from inflammation or from a buildup of fat, cholesterol, calcium, and other substances (plaque). Plaque decreases the amount of blood that can flow through the artery. Atherosclerosis can affect any artery in your body, including: Heart arteries. Damage to these arteries may lead to coronary artery disease, which can cause a heart attack. Brain arteries. Damage to these arteries may cause a stroke. Leg, arm, and pelvis arteries. Peripheral artery disease (PAD) may result from damage to these arteries. Kidney arteries. Kidney (renal) failure may result from damage to kidney arteries. Treatment may slow the disease and prevent further damage to your heart, brain,peripheral arteries, and kidneys. What are the causes? This condition develops slowly over many years. The inner layers of your arteries become damaged and allow the gradual buildup of plaque. The exact cause of atherosclerosis is not fully understood. Symptoms of atherosclerosisdo not occur until an artery becomes narrow or blocked. What increases the risk? The following factors may make you more likely to develop this condition: Being middle-aged or older. Certain medical conditions, including: High blood pressure. High cholesterol. High blood fats (triglycerides). Diabetes. Sleep apnea. A family history of atherosclerosis. Being overweight. Using products that contain tobacco or nicotine. Not exercising enough (sedentary lifestyle). Having a substance in your blood called C-reactive protein (CRP). This is a sign of increased levels of inflammation in your body. Being stressed. Drinking too much alcohol or using drugs, such as cocaine or methamphetamine. What are the signs or symptoms? Symptoms of  atherosclerosis may not be obvious until there is damage to an area of your body that is not getting enough blood. Sometimes, atherosclerosis doesnot cause symptoms. Symptoms of this condition include: Coronary artery disease. This may cause chest pain and shortness of breath. Decreased blood supply to your brain, which may cause a stroke. Signs of a stroke may include sudden: Weakness or numbness in your face, arm, or leg, especially on one side of your body. Trouble walking or difficulty moving your arms or legs. Loss of balance or coordination. Confusion. Slurred speech (dysarthria). Trouble speaking, or trouble understanding speech, or both (aphasia). Vision changes in one or both eyes. This may be double vision, blurred vision, or loss of vision. Severe headache with no known cause. The headache is often described as the worst headache ever experienced. PAD, which may cause pain and numbness, often in your legs and hips. Renal failure. This may cause tiredness, problems with urination, swelling, and itchy skin. How is this diagnosed? This condition is diagnosed based on your medical history and a physical exam. During the exam, your health care provider will: Check your pulse in different places. Listen for a "whooshing" sound over your arteries (bruit). You may also have tests, such as: Blood tests to check your levels of cholesterol, triglycerides, and CRP. Electrocardiogram (ECG) to check for heart damage. Chest X-ray to see if you have an enlarged heart, which is a sign of heart failure. Stress test to see how your heart reacts to exercise. Echocardiogram to get images of the inside of your heart. Ankle-brachial index to compare blood pressure in your arms to blood pressure in your ankles. Ultrasound of your peripheral arteries to check blood flow. CT scan to check for damage to your heart or brain. X-rays of blood vessels after  dye has been injected (angiogram) to check blood  flow. How is this treated? This condition is treated with lifestyle changes as the first step. These may include: Changing your diet. Losing weight. Reducing stress. Exercising and being physically active more regularly. Quitting smoking. You may also need medicine to: Lower triglycerides and cholesterol. Control blood pressure. Prevent blood clots. Lower inflammation in your body. Control your blood sugar. Sometimes, surgery is needed to: Remove plaque from an artery (endarterectomy). Open or widen a narrowed heart artery (angioplasty). Create a new path for your blood with one of these procedures: Heart (coronary) artery bypass graft surgery. Peripheral artery bypass graft surgery. Follow these instructions at home: Eating and drinking  Eat a heart-healthy diet. Talk with your health care provider or a diet and nutrition specialist (dietitian) if you need help. A heart-healthy diet involves: Limiting unhealthy fats and increasing healthy fats. Some examples of healthy fats are olive oil and canola oil. Eating plant-based foods, such as fruits, vegetables, nuts, whole grains, and legumes (such as peas and lentils). If you drink alcohol: Limit how much you use to: 0-1 drink a day for nonpregnant women. 0-2 drinks a day for men. Be aware of how much alcohol is in your drink. In the [place] one drink equals one 12 oz bottle of beer (355 mL), one 5 oz glass of wine (148 mL), or one 1 oz glass of hard liquor (44 mL).  Lifestyle  Maintain a healthy weight. Lose weight if your health care provider says that you need to do that. Follow an exercise program as told by your health care provider. Do not use any products that contain nicotine or tobacco, such as cigarettes, e-cigarettes, and chewing tobacco. If you need help quitting, ask your health care provider. Do not use drugs.  General instructions Take over-the-counter and prescription medicines only as told by your health care  provider. Manage other health conditions as told. Keep all follow-up visits as told by your health care provider. This is important. Contact a health care provider if you have: An irregular heartbeat. Unexplained fatigue. Trouble urinating, or you are producing less urine or foamy urine. Swelling of your hands or feet, or itchy skin. Unexplained pain or numbness in your legs or hips. Get help right away if: You have any symptoms of a heart attack. These may be: Chest pain. This includes squeezing chest pain that may feel like indigestion (angina). Shortness of breath. Pain in your neck, jaw, arms, back, or stomach. Cold sweat. Nausea. Light-headedness. You have any symptoms of a stroke. "BE FAST" is an easy way to remember the main warning signs of a stroke: B - Balance. Signs are dizziness, sudden trouble walking, or loss of balance. E - Eyes. Signs are trouble seeing or a sudden change in vision. F - Face. Signs are sudden weakness or numbness of the face, or the face or eyelid drooping on one side. A - Arms. Signs are weakness or numbness in an arm. This happens suddenly and usually on one side of the body. S - Speech. Signs are sudden trouble speaking, slurred speech, or trouble understanding what people say. T - Time. Time to call emergency services. Write down what time symptoms started. You have other signs of a stroke, such as: A sudden, severe headache with no known cause. Nausea or vomiting. Seizure. These symptoms may represent a serious problem that is an emergency. Do not wait to see if the symptoms will go away. Get medical help right  away. Call your local emergency services (911 in the [place] Do not drive yourself to the hospital. Summary Atherosclerosis is narrowing and hardening of the arteries. Arteries can become narrow from inflammation or from a buildup of fat, cholesterol, calcium, and other substances (plaque). This condition may not cause any symptoms. If you  do have symptoms, they are caused by damage to an area of your body that is not getting enough blood. Treatment starts with lifestyle changes and may include medicines. In some cases, surgery is needed. Get help right away if you have any symptoms of a heart attack or stroke. This information is not intended to replace advice given to you by your health care provider. Make sure you discuss any questions you have with your healthcare provider. Document Revised: 05/25/2019 Document Reviewed: 05/25/2019 Elsevier Patient Education  2022 [place]. High Cholesterol  High cholesterol is a condition in which the blood has high levels of a Nagorski, waxy substance similar to fat (cholesterol). The liver makes all the cholesterol that the body needs. The human body needs small amounts of cholesterol to help build cells. A person gets extra orexcess cholesterol from the food that he or she eats. The blood carries cholesterol from the liver to the rest of the body. If you have high cholesterol, deposits (plaques) may build up on the walls of your arteries. Arteries are the blood vessels that carry blood away from your heart. These plaques make the arteries narrowand stiff. Cholesterol plaques increase your risk for heart attack and stroke. Work withyour health care provider to keep your cholesterol levels in a healthy range. What increases the risk? The following factors may make you more likely to develop this condition: Eating foods that are high in animal fat (saturated fat) or cholesterol. Being overweight. Not getting enough exercise. A family history of high cholesterol (familial hypercholesterolemia). Use of tobacco products. Having diabetes. What are the signs or symptoms? There are no symptoms of this condition. How is this diagnosed? This condition may be diagnosed based on the results of a blood test. If you are older than 62 years of age, your health care provider may check your cholesterol  levels every 4-6 years. You may be checked more often if you have high cholesterol or other risk factors for heart disease. The blood test for cholesterol measures: "Bad" cholesterol, or LDL cholesterol. This is the main type of cholesterol that causes heart disease. The desired level is less than 100 mg/dL. "Good" cholesterol, or HDL cholesterol. HDL helps protect against heart disease by cleaning the arteries and carrying the LDL to the liver for processing. The desired level for HDL is 60 mg/dL or higher. Triglycerides. These are fats that your body can store or burn for energy. The desired level is less than 150 mg/dL. Total cholesterol. This measures the total amount of cholesterol in your blood and includes LDL, HDL, and triglycerides. The desired level is less than 200 mg/dL. How is this treated? This condition may be treated with: Diet changes. You may be asked to eat foods that have more fiber and less saturated fats or added sugar. Lifestyle changes. These may include regular exercise, maintaining a healthy weight, and quitting use of tobacco products. Medicines. These are given when diet and lifestyle changes have not worked. You may be prescribed a statin medicine to help lower your cholesterol levels. Follow these instructions at home: Eating and drinking  Eat a healthy, balanced diet. This diet includes: Daily servings of a variety  of fresh, frozen, or canned fruits and vegetables. Daily servings of whole grain foods that are rich in fiber. Foods that are low in saturated fats and trans fats. These include poultry and fish without skin, lean cuts of meat, and low-fat dairy products. A variety of fish, especially oily fish that contain omega-3 fatty acids. Aim to eat fish at least 2 times a week. Avoid foods and drinks that have added sugar. Use healthy cooking methods, such as roasting, grilling, broiling, baking, poaching, steaming, and stir-frying. Do not fry your food except for  stir-frying.  Lifestyle  Get regular exercise. Aim to exercise for a total of 150 minutes a week. Increase your activity level by doing activities such as gardening, walking, and taking the stairs. Do not use any products that contain nicotine or tobacco, such as cigarettes, e-cigarettes, and chewing tobacco. If you need help quitting, ask your health care provider.  General instructions Take over-the-counter and prescription medicines only as told by your health care provider. Keep all follow-up visits as told by your health care provider. This is important. Where to find more information American Heart Association: www.heart.org National Heart, Lung, and Blood Institute: https://wilson-eaton.com/ Contact a health care provider if: You have trouble achieving or maintaining a healthy diet or weight. You are starting an exercise program. You are unable to stop smoking. Get help right away if: You have chest pain. You have trouble breathing. You have any symptoms of a stroke. "BE FAST" is an easy way to remember the main warning signs of a stroke: B - Balance. Signs are dizziness, sudden trouble walking, or loss of balance. E - Eyes. Signs are trouble seeing or a sudden change in vision. F - Face. Signs are sudden weakness or numbness of the face, or the face or eyelid drooping on one side. A - Arms. Signs are weakness or numbness in an arm. This happens suddenly and usually on one side of the body. S - Speech. Signs are sudden trouble speaking, slurred speech, or trouble understanding what people say. T - Time. Time to call emergency services. Write down what time symptoms started. You have other signs of a stroke, such as: A sudden, severe headache with no known cause. Nausea or vomiting. Seizure. These symptoms may represent a serious problem that is an emergency. Do not wait to see if the symptoms will go away. Get medical help right away. Call your local emergency services (911 in the  [place] Do not drive yourself to the hospital. Summary Cholesterol plaques increase your risk for heart attack and stroke. Work with your health care provider to keep your cholesterol levels in a healthy range. Eat a healthy, balanced diet, get regular exercise, and maintain a healthy weight. Do not use any products that contain nicotine or tobacco, such as cigarettes, e-cigarettes, and chewing tobacco. Get help right away if you have any symptoms of a stroke. This information is not intended to replace advice given to you by your health care provider. Make sure you discuss any questions you have with your healthcare provider. Document Revised: 06/15/2019 Document Reviewed: 06/15/2019 Elsevier Patient Education  2022 [employer].

## 2021-02-01 NOTE — Progress Notes (Signed)
This visit occurred during the SARS-CoV-2 public health emergency.  Safety protocols were in place, including screening questions prior to the visit, additional usage of staff PPE, and extensive cleaning of exam room while observing appropriate contact time as indicated for disinfecting solutions.    Patient ID: [name], female  DOB: January 10, 1961, 60 y.o.   MRN: [id number] Patient Care Team    Relationship Specialty Notifications Start End  Ma Hillock, DO PCP - General Family Medicine  03/29/15   Janice Cobbs, MD Consulting Physician Obstetrics and Gynecology  09/28/33   [name] Counselor Genetic Counselor  10/29/19   Janice Pole, MD Consulting Physician Gastroenterology  04/13/20     Chief Complaint  Patient presents with   Hyperlipidemia    Pt is not fasting    Subjective:  [name] is a 60 y.o.  Female  present for [facility] Insomnia/neck pain Patient reports insomnia is well controlled on Ambien 2.5-5 mg daily at bedtime. She has been on this medication since ~2012. She reports she routinely needs this medication- sometimes she takes a half tab.. She denies negative side effects. She reports she has continued to use the baclofen BID for neck pain and feels it has been helpful.    Mixed hyperlipidemia/ On statin therapy/Family history of premature coronary artery disease Tolerating pravastatin, fenofibrate and fish oil 1000 mg QD.   Complaint w/ pravastatin 20 mg a day.  Patient's younger brother recently passed away suddenly at age 37 from a heart attack.  She would like to discuss more aggressive prevention for her considering her family history and her brother's recent passing. She presently is on a statin and fenofibrate to help with her cholesterol panel.  She is exercising and has lost approximately 12 pounds.  She is watching her diet and trying to make healthy decisions.   Prediabetes HgB A1c  6.1>> 5.8> 5.8> 5.7 last check. Exercising  daily again NOOM_ Dieting > lost 10-12 lbs!!   Depression screen [facility] 2/9 10/31/2020 05/04/2019 05/22/2018 10/30/2017 05/17/2017  Decreased Interest 0 0 0 0 0  Down, Depressed, Hopeless 0 - 0 0 0  PHQ - 2 Score 0 0 0 0 0   No flowsheet data found. -  Immunization History  Administered Date(s) Administered   Influenza Inj Mdck Quad Pf 04/18/2018   Influenza, Quadrivalent, Recombinant, Inj, Pf 04/30/2019   Influenza,inj,Quad PF,6+ Mos 05/17/2017, 04/13/2020   Influenza-Unspecified 06/20/2015, 05/25/2016, 04/30/2019   PFIZER(Purple Top)SARS-COV-2 Vaccination 10/03/2019, 10/24/2019, 06/18/2020   PPD Test 03/16/2014   Tdap 04/03/2013   Zoster Recombinat (Shingrix) 10/30/2017, 01/27/2018     Past Medical History:  Diagnosis Date   Anemia    PAST HX- ON IRON    Arthritis    neck   Cataract    BEGINNING    COVID-19 virus infection 05/2019   Diverticulosis of colon    MILD   Elevated cholesterol with elevated triglycerides    Eustachian tube dysfunction, left 05/22/2018   Family history of breast cancer    Family history of cervical cancer    GERD (gastroesophageal reflux disease)    History of rectocele    Insomnia    started ambien 5 ya.   Osteopenia    --left femur   Periodic heart flutter    SVD (spontaneous vaginal delivery)    x 2   Urinary incontinence    Allergies  Allergen Reactions   Amoxicillin Hives   Sulfa Antibiotics Rash  Childhood rxn   Past Surgical History:  Procedure Laterality Date   BLADDER SUSPENSION N/A 11/10/2013   Procedure: TRANSVAGINAL TAPE (TVT) PROCEDURE;  Surgeon: Janice Reas de Berton Lan, MD;  Location: [facility];  Service: Gynecology;  Laterality: N/A;   COLONOSCOPY  2011   [place] NORMAL    CYSTOSCOPY N/A 11/10/2013   Procedure: CYSTOSCOPY;  Surgeon: Janice Reas de Berton Lan, MD;  Location: [facility];  Service: Gynecology;  Laterality: N/A;   IRRIGATION AND DEBRIDEMENT SEBACEOUS CYST  03/2017   VAGINAL  HYSTERECTOMY  11/11    Vag cuff, mild rectocele   WISDOM TOOTH EXTRACTION     Family History  Problem Relation Age of Onset   Arthritis Mother    Hypertension Mother    Hyperlipidemia Mother    Heart disease Mother 19       dec   Hypertension Father    Hyperlipidemia Father    Heart disease Father 91       smoked for 10 yrs., no muscle damage, 1 MI    Pulmonary fibrosis Father        diagnosed end stage, cause unkn.   Urolithiasis Father    Diverticulosis Sister    Breast cancer Sister 43   Heart disease Brother    Hyperlipidemia Brother    Obesity Brother    Heart attack Brother 72       Passed away at 68 of heart attack   Hyperlipidemia Brother    Hypertension Brother    Kidney Stones Brother    Anti-cardiolipin syndrome Brother    Arthritis Maternal Grandmother    Cervical cancer Maternal Grandmother        diagnosed in her 5s or 73s   Arthritis Paternal Grandmother    Diabetes Son    Asthma Son    Seizures Son    Asthma Son    Colon cancer Neg Hx    Colon polyps Neg Hx    Esophageal cancer Neg Hx    Rectal cancer Neg Hx    Stomach cancer Neg Hx    Social History   Social History Narrative   Relocated to [place] from [place]. Due to husband job transfer. 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 She lives with her husband. She has 2 grown sons- oldest recently graduated from college and took a job in [place].; youngest just graduated HS & ia attending [place].      Has had regular preventive care in [place]. At [facility] w/Dr. Alfonse Spruce. Only health concern is insomnia that developed about 5 years ago & has been treated with [place]. She has not practiced sleep hygiene.      Occasionally drinks sweet tea.     Allergies as of 02/01/2021       Reactions   Amoxicillin Hives   Sulfa Antibiotics Rash   Childhood rxn        Medication List        Accurate as of February 01, 2021 11:59  PM. If you have any questions, ask your nurse or doctor.          STOP taking these medications    pravastatin 20 MG tablet Commonly known as: PRAVACHOL Stopped by: Howard Pouch, DO   triamcinolone ointment 0.5 % Commonly known as: KENALOG Stopped by: Howard Pouch, DO       TAKE these medications  atorvastatin 40 MG tablet Commonly known as: LIPITOR Take 1 tablet (40 mg total) by mouth daily. Started by: Howard Pouch, DO   baclofen 10 MG tablet Commonly known as: LIORESAL Take 1 tablet (10 mg total) by mouth 2 (two) times daily.   Caltrate 600+D Plus Minerals 600-800 MG-UNIT Tabs Take by mouth.   fenofibrate 145 MG tablet Commonly known as: TRICOR Take 1 tablet (145 mg total) by mouth daily.   Fusion Plus Caps Take 1 capsule by mouth daily.   Omega 3 1200 MG Caps Take 2 capsules by mouth.   pantoprazole 20 MG tablet Commonly known as: PROTONIX Take 1 tablet (20 mg total) by mouth daily.   spironolactone 50 MG tablet Commonly known as: ALDACTONE Take 50 mg by mouth 2 (two) times daily.   tamoxifen 20 MG tablet Commonly known as: NOLVADEX Take 20 mg by mouth daily.   zolpidem 5 MG tablet Commonly known as: AMBIEN Take 0.5-1 tablets (2.5-5 mg total) by mouth at bedtime as needed for sleep.        All past medical history, surgical history, allergies, family history, immunizations andmedications were updated in the EMR today and reviewed under the history and medication portions of their EMR.      ROS: 14 pt review of systems performed and negative (unless mentioned in an HPI)  Objective: BP 109/71   Pulse 83   Temp 98.7 F (37.1 C) (Oral)   Ht 5\' 4"  (1.626 m)   Wt 149 lb (67.6 kg)   LMP 07/30/2009 (Within Years)   SpO2 97%   BMI 25.58 kg/m  Gen: Afebrile. No acute distress.  Nontoxic, very pleasant female. HENT: AT. [place].  Eyes:Pupils Equal Round Reactive to light, Extraocular movements intact,  Conjunctiva without redness, discharge or  icterus. Neck/lymp/endocrine: Supple, no lymphadenopathy, no thyromegaly CV: RRR no murmur, no edema, +2/4 P posterior tibialis pulses, negative bruit Chest: CTAB, no wheeze or crackles Neuro:  Normal gait. PERLA. EOMi. Alert. Oriented x3  Psych: Normal affect, dress and demeanor. Normal speech. Normal thought content and judgment.   No results found.  Assessment/plan: JEMIMA PETKO is a 60 y.o. female present for CPE/cmc Gastroesophageal reflux disease without esophagitis/long term PPI Stable. Continue Protonix. She takes QOD if possible and QD if flares . Vit d, b12, mag labs up-to-date  Insomnia, unspecified type Stable. -Continue ambien 2.5-5 mg QHS today.  - [place] reviewed 02/02/21 - Contract signed.  - F/U 6 month  or  when needing refills.    Mixed hyperlipidemia/ On statin therapy/Family history of premature coronary artery disease We discussed heart healthy diet and heart disease prevention today. - She is doing great with her diet and exercise program, lost 12 pounds. -Elected to discontinue her pravastatin and replaced with Lipitor 40 mg daily, with more aggressive LDL goal of 70. Continue fenofibrate - continue fish oil 2400 mg QD -CMP and lipid Future orders placed to see response to fenofibrate. Will also need to repeat these labs when she follows up for her normal routine appointment to see response to atorvastatin or pravastatin. -We did discuss cardiac CT scoring and she would like to have this completed.  She is aware it is a $99 out-of-pocket fee and it will not be turned into her insurance.  We will call her with these results once we receive them.    Prediabetes - last A1c 6.1--> 5.8-->5.8>>5.7 - monitor yearly as long as < 5.9 and fbg normal   Vitamin D deficiency/Osteopenia -  taking her daily supplement Bone density UTD through GYN   Iron deficiency:   Return in about 6 months (around 07/25/2021) for [facility] (30 min).    Orders Placed This  Encounter  Procedures   CT CARDIAC SCORING (SELF PAY ONLY)   Lipid panel   Comprehensive metabolic panel    Meds ordered this encounter  Medications   atorvastatin (LIPITOR) 40 MG tablet    Sig: Take 1 tablet (40 mg total) by mouth daily.    Dispense:  90 tablet    Refill:  3   zolpidem (AMBIEN) 5 MG tablet    Sig: Take 0.5-1 tablets (2.5-5 mg total) by mouth at bedtime as needed for sleep.    Dispense:  90 tablet    Refill:  1    Referral Orders  No referral(s) requested today     Electronically signed by: Howard Pouch, [place]

## 2021-02-02 ENCOUNTER — Encounter: Payer: Self-pay | Admitting: Family Medicine

## 2021-02-02 ENCOUNTER — Ambulatory Visit (INDEPENDENT_AMBULATORY_CARE_PROVIDER_SITE_OTHER): Payer: BC Managed Care – PPO

## 2021-02-02 DIAGNOSIS — E782 Mixed hyperlipidemia: Secondary | ICD-10-CM

## 2021-02-02 LAB — LIPID PANEL
Cholesterol: 146 mg/dL (ref 0–200)
HDL: 39.9 mg/dL (ref >39.00)
LDL Cholesterol: 77 mg/dL (ref 0–99)
NonHDL: 105.66
Total CHOL/HDL Ratio: 4
Triglycerides: 141 mg/dL (ref 0.0–149.0)
VLDL: 28.2 mg/dL (ref 0.0–40.0)

## 2021-02-02 LAB — COMPREHENSIVE METABOLIC PANEL
ALT: 12 U/L (ref 0–35)
AST: 15 U/L (ref 0–37)
Albumin: 4.6 g/dL (ref 3.5–5.2)
Alkaline Phosphatase: 29 U/L — ABNORMAL LOW (ref 39–117)
BUN: 22 mg/dL (ref 6–23)
CO2: 24 mEq/L (ref 19–32)
Calcium: 9.7 mg/dL (ref 8.4–10.5)
Chloride: 107 mEq/L (ref 96–112)
Creatinine, Ser: 0.97 mg/dL (ref 0.40–1.20)
GFR: 62.99 mL/min (ref >60.00)
Glucose, Bld: 94 mg/dL (ref 70–99)
Potassium: 4.3 mEq/L (ref 3.5–5.1)
Sodium: 140 mEq/L (ref 135–145)
Total Bilirubin: 0.4 mg/dL (ref 0.2–1.2)
Total Protein: 6.9 g/dL (ref 6.0–8.3)

## 2021-02-02 MED ORDER — ZOLPIDEM TARTRATE 5 MG PO TABS: 2.5000 mg | ORAL_TABLET | Freq: Every evening | ORAL | 1 refills | Status: DC | PRN

## 2021-02-03 ENCOUNTER — Encounter: Payer: Self-pay | Admitting: Family Medicine

## 2021-02-07 ENCOUNTER — Ambulatory Visit (HOSPITAL_BASED_OUTPATIENT_CLINIC_OR_DEPARTMENT_OTHER)
Admission: RE | Admit: 2021-02-07 | Discharge: 2021-02-07 | Disposition: A | Discharge status: Home / Self Care | Payer: BC Managed Care – PPO | Source: Ambulatory Visit | Attending: Family Medicine | Admitting: Family Medicine

## 2021-02-07 ENCOUNTER — Other Ambulatory Visit: Payer: Self-pay

## 2021-02-07 DIAGNOSIS — Z8249 Family history of ischemic heart disease and other diseases of the circulatory system: Secondary | ICD-10-CM

## 2021-02-07 DIAGNOSIS — E782 Mixed hyperlipidemia: Secondary | ICD-10-CM | POA: Insufficient documentation

## 2021-02-09 ENCOUNTER — Telehealth: Payer: Self-pay | Admitting: Family Medicine

## 2021-02-09 DIAGNOSIS — I700 Atherosclerosis of aorta: Secondary | ICD-10-CM | POA: Insufficient documentation

## 2021-02-09 DIAGNOSIS — R931 Abnormal findings on diagnostic imaging of heart and coronary circulation: Secondary | ICD-10-CM | POA: Insufficient documentation

## 2021-02-09 NOTE — Telephone Encounter (Signed)
Spoke with pt regarding labs and instructions.   

## 2021-02-09 NOTE — Telephone Encounter (Signed)
Please inform patient the following information: Overall, her coronary calcium score is good.  It is not 0, but it is close.  She does have very minimal plaque buildup with a total score of 2.4.    -She has been mild plaque buildup in the aorta (large vessel that takes blood from the heart to the body) and some incidentally found small cysts of the liver which are not worrisome.  Prevention for her is keeping her cholesterol levels good.  LDL less than 70 would be best.  There should not be an issue with the new atorvastatin start.  Keep blood pressure in normal range.  Eat a heart healthy diet and get routine exercise.

## 2021-02-15 DIAGNOSIS — D2272 Melanocytic nevi of left lower limb, including hip: Secondary | ICD-10-CM | POA: Diagnosis not present

## 2021-02-15 DIAGNOSIS — D2271 Melanocytic nevi of right lower limb, including hip: Secondary | ICD-10-CM | POA: Diagnosis not present

## 2021-02-15 DIAGNOSIS — L821 Other seborrheic keratosis: Secondary | ICD-10-CM | POA: Diagnosis not present

## 2021-02-15 DIAGNOSIS — D1801 Hemangioma of skin and subcutaneous tissue: Secondary | ICD-10-CM | POA: Diagnosis not present

## 2021-02-27 DIAGNOSIS — U071 COVID-19: Secondary | ICD-10-CM

## 2021-02-27 DIAGNOSIS — Z8616 Personal history of COVID-19: Secondary | ICD-10-CM

## 2021-02-27 HISTORY — DX: COVID-19: U07.1

## 2021-02-27 HISTORY — DX: Personal history of COVID-19: Z86.16

## 2021-03-03 ENCOUNTER — Telehealth (INDEPENDENT_AMBULATORY_CARE_PROVIDER_SITE_OTHER): Payer: BC Managed Care – PPO | Admitting: Family Medicine

## 2021-03-03 ENCOUNTER — Other Ambulatory Visit: Payer: Self-pay

## 2021-03-03 ENCOUNTER — Encounter: Payer: Self-pay | Admitting: Family Medicine

## 2021-03-03 DIAGNOSIS — U071 COVID-19: Secondary | ICD-10-CM

## 2021-03-03 HISTORY — DX: COVID-19: U07.1

## 2021-03-03 MED ORDER — NIRMATRELVIR/RITONAVIR (PAXLOVID)TABLET: 3.0000 | ORAL_TABLET | Freq: Two times a day (BID) | ORAL | 0 refills | Status: AC

## 2021-03-03 NOTE — Progress Notes (Signed)
Virtual Visit via video Note  This visit type was conducted due to national recommendations for restrictions regarding the COVID-19 pandemic (e.g. social distancing).  This format is felt to be most appropriate for this patient at this time.  All issues noted in this document were discussed and addressed.  No physical exam was performed (except for noted visual exam findings with Video Visits).   I connected with [name] today at  3:45 PM EDT by a video enabled telemedicine application or telephone and verified that I am speaking with the correct person using two identifiers. Location patient: home Location provider: work  Persons participating in the virtual visit: patient, provider  I discussed the limitations, risks, security and privacy concerns of performing an evaluation and management service by telephone and the availability of in person appointments. I also discussed with the patient that there may be a patient responsible charge related to this service. The patient expressed understanding and agreed to proceed.  Reason for visit: COVID 19  HPI: Patient notes onset of symptoms on 03/01/2021.  She just traveled to [place].  She notes the symptoms started with a scratchy throat and she has developed a dry cough as well as nasal and sinus congestion.  She has a sore throat with some postnasal drip.  Her ears hurt at times.  Some headaches.  No fevers.  No shortness of breath, taste disturbances, or smell disturbances.  No known exposures to COVID.  She has received 3 COVID vaccines.  She has tried an over-the-counter nasal decongestant and Tylenol.  She had a positive home test.   ROS: See pertinent positives and negatives per HPI.  Past Medical History:  Diagnosis Date   Anemia    PAST HX- ON IRON    Arthritis    neck   Cataract    BEGINNING    COVID-19 virus infection 05/2019   Diverticulosis of colon    MILD   Elevated cholesterol with elevated triglycerides     Eustachian tube dysfunction, left 05/22/2018   Family history of breast cancer    Family history of cervical cancer    GERD (gastroesophageal reflux disease)    History of rectocele    Insomnia    started ambien 5 ya.   Osteopenia    --left femur   Periodic heart flutter    SVD (spontaneous vaginal delivery)    x 2   Urinary incontinence     Past Surgical History:  Procedure Laterality Date   BLADDER SUSPENSION N/A 11/10/2013   Procedure: TRANSVAGINAL TAPE (TVT) PROCEDURE;  Surgeon: Jamey Reas de Berton Lan, MD;  Location: [facility];  Service: Gynecology;  Laterality: N/A;   COLONOSCOPY  2011   [place] NORMAL    CYSTOSCOPY N/A 11/10/2013   Procedure: CYSTOSCOPY;  Surgeon: Jamey Reas de Berton Lan, MD;  Location: [facility];  Service: Gynecology;  Laterality: N/A;   IRRIGATION AND DEBRIDEMENT SEBACEOUS CYST  03/2017   VAGINAL HYSTERECTOMY  11/11    Vag cuff, mild rectocele   WISDOM TOOTH EXTRACTION      Family History  Problem Relation Age of Onset   Arthritis Mother    Hypertension Mother    Hyperlipidemia Mother    Heart disease Mother 1       dec   Hypertension Father    Hyperlipidemia Father    Heart disease Father 68       smoked for 10 yrs., no muscle damage, 1 MI  Pulmonary fibrosis Father        diagnosed end stage, cause unkn.   Urolithiasis Father    Diverticulosis Sister    Breast cancer Sister 28   Heart disease Brother    Hyperlipidemia Brother    Obesity Brother    Heart attack Brother 38       Passed away at 28 of heart attack   Hyperlipidemia Brother    Hypertension Brother    Kidney Stones Brother    Anti-cardiolipin syndrome Brother    Arthritis Maternal Grandmother    Cervical cancer Maternal Grandmother        diagnosed in her 37s or 5s   Arthritis Paternal Grandmother    Diabetes Son    Asthma Son    Seizures Son    Asthma Son    Colon cancer Neg Hx    Colon polyps Neg Hx    Esophageal cancer Neg Hx    Rectal  cancer Neg Hx    Stomach cancer Neg Hx     SOCIAL HX: nonsmoker   Current Outpatient Medications:    atorvastatin (LIPITOR) 40 MG tablet, Take 1 tablet (40 mg total) by mouth daily., Disp: 90 tablet, Rfl: 3   baclofen (LIORESAL) 10 MG tablet, Take 1 tablet (10 mg total) by mouth 2 (two) times daily., Disp: 180 each, Rfl: 1   Calcium Carbonate-Vit D-Min (CALTRATE 600+D PLUS MINERALS) 600-800 MG-UNIT TABS, Take by mouth., Disp: , Rfl:    fenofibrate (TRICOR) 145 MG tablet, Take 1 tablet (145 mg total) by mouth daily., Disp: 90 tablet, Rfl: 3   Iron-FA-B Cmp-C-Biot-Probiotic (FUSION PLUS) CAPS, Take 1 capsule by mouth daily., Disp: , Rfl:    nirmatrelvir/ritonavir EUA (PAXLOVID) TABS, Take 3 tablets by mouth 2 (two) times daily for 5 days. (Take nirmatrelvir 150 mg two tablets twice daily for 5 days and ritonavir 100 mg one tablet twice daily for 5 days) Patient GFR is 62.99, Disp: 30 tablet, Rfl: 0   Omega 3 1200 MG CAPS, Take 2 capsules by mouth., Disp: , Rfl:    pantoprazole (PROTONIX) 20 MG tablet, Take 1 tablet (20 mg total) by mouth daily., Disp: 90 tablet, Rfl: 3   spironolactone (ALDACTONE) 50 MG tablet, Take 50 mg by mouth 2 (two) times daily., Disp: , Rfl:    tamoxifen (NOLVADEX) 20 MG tablet, Take 20 mg by mouth daily., Disp: , Rfl:    zolpidem (AMBIEN) 5 MG tablet, Take 0.5-1 tablets (2.5-5 mg total) by mouth at bedtime as needed for sleep., Disp: 90 tablet, Rfl: 1  EXAM:  VITALS per patient if applicable:  GENERAL: alert, oriented, appears well and in no acute distress  HEENT: atraumatic, conjunttiva clear, no obvious abnormalities on inspection of external nose and ears  NECK: normal movements of the head and neck  LUNGS: on inspection no signs of respiratory distress, breathing rate appears normal, no obvious gross SOB, gasping or wheezing  CV: no obvious cyanosis  MS: moves all visible extremities without noticeable abnormality  PSYCH/NEURO: pleasant and cooperative,  no obvious depression or anxiety, speech and thought processing grossly intact  ASSESSMENT AND PLAN:  Discussed the following assessment and plan:  Problem List Items Addressed This Visit     COVID-19    Patient had a positive home COVID testing and has COVID symptoms.  Discussed supportive treatment with Tylenol and/or ibuprofen for headaches and/or fevers.  Also discussed limiting use of nasal decongestants.  The patient is higher risk based on her overweight BMI.  We discussed treatment options with paxlovid and molnupiravir.  She was interested in treatment.  I will send in paxlovid to the patient's pharmacy and we will call to confirm that they have this.  Discussed risk of altered taste, diarrhea, myalgias, and elevated blood pressures.  She will hold her Lipitor starting today and continue to hold it until 4 to 5 days after finishing the paxlovid.  I did discuss the potential for rebound symptoms after finishing the paxlovid. She will remain quarantined through 03/11/2021 and only come off of quarantine if her symptoms have improved and she has been fever free for 24 hours.  Discussed seeking medical attention in person for shortness of breath, cough productive of blood, and fevers of 103 F or higher.       Relevant Medications   nirmatrelvir/ritonavir EUA (PAXLOVID) TABS    No follow-ups on file.   I discussed the assessment and treatment plan with the patient. The patient was provided an opportunity to ask questions and all were answered. The patient agreed with the plan and demonstrated an understanding of the instructions.   The patient was advised to call back or seek an in-person evaluation if the symptoms worsen or if the condition fails to improve as anticipated.   Tommi Rumps, MD

## 2021-03-03 NOTE — Assessment & Plan Note (Addendum)
Patient had a positive home COVID testing and has COVID symptoms.  Discussed supportive treatment with Tylenol and/or ibuprofen for headaches and/or fevers.  Also discussed limiting use of nasal decongestants.  The patient is higher risk based on her overweight BMI.  We discussed treatment options with paxlovid and molnupiravir.  She was interested in treatment.  I will send in paxlovid to the patient's pharmacy and we will call to confirm that they have this.  Discussed risk of altered taste, diarrhea, myalgias, and elevated blood pressures.  She will hold her Lipitor starting today and continue to hold it until 4 to 5 days after finishing the paxlovid.  I did discuss the potential for rebound symptoms after finishing the paxlovid. She will remain quarantined through 03/11/2021 and only come off of quarantine if her symptoms have improved and she has been fever free for 24 hours.  Discussed seeking medical attention in person for shortness of breath, cough productive of blood, and fevers of 103 F or higher.

## 2021-03-20 ENCOUNTER — Other Ambulatory Visit: Payer: Self-pay

## 2021-03-20 ENCOUNTER — Encounter: Payer: Self-pay | Admitting: Family Medicine

## 2021-03-20 ENCOUNTER — Ambulatory Visit (INDEPENDENT_AMBULATORY_CARE_PROVIDER_SITE_OTHER): Payer: BC Managed Care – PPO | Admitting: Family Medicine

## 2021-03-20 VITALS — BP 105/67 | HR 73 | Temp 98.1°F | Wt 145.0 lb

## 2021-03-20 DIAGNOSIS — S39012A Strain of muscle, fascia and tendon of lower back, initial encounter: Secondary | ICD-10-CM

## 2021-03-20 MED ORDER — CYCLOBENZAPRINE HCL 10 MG PO TABS
10.0000 mg | ORAL_TABLET | Freq: Three times a day (TID) | ORAL | 1 refills | Status: DC | PRN
Start: 2021-03-20 — End: 2022-11-23

## 2021-03-20 MED ORDER — METHYLPREDNISOLONE ACETATE 80 MG/ML IJ SUSP
80.0000 mg | Freq: Once | INTRAMUSCULAR | Status: AC
  Administered 2021-03-20: 80 mg via INTRAMUSCULAR

## 2021-03-20 MED ORDER — PREDNISONE 20 MG PO TABS: ORAL_TABLET | ORAL | 0 refills | Status: DC

## 2021-03-20 NOTE — Progress Notes (Signed)
This visit occurred during the SARS-CoV-2 public health emergency.  Safety protocols were in place, including screening questions prior to the visit, additional usage of staff PPE, and extensive cleaning of exam room while observing appropriate contact time as indicated for disinfecting solutions.    [name] , 17-Oct-1960, 60 y.o., female MRN: [id number] Patient Care Team    Relationship Specialty Notifications Start End  Ma Hillock, DO PCP - General Family Medicine  03/29/15   Nunzio Cobbs, MD Consulting Physician Obstetrics and Gynecology  [phone]   Clint Guy Counselor Genetic Counselor  10/29/19   Mauri Pole, MD Consulting Physician Gastroenterology  04/13/20     Chief Complaint  Patient presents with   Back Pain    Pt c/o LBP x 2 days; denies radiation     Subjective: Pt presents for an OV with complaints of lower back pain 2 days duration. Pt reports it is the same pain she has had in the past. She was just sitting down in a chair and she felt her back spasm and lock. She reports pain with transitioning positions.  She denies radiation of pain to extremity. Pain is constant across lower back.    Prior note:  Pt presents for an acute OV with complaints of back pain of 2 days duration.  Pt has had similar episode 10 years ago and back locked up and she could not move . This resulted in her being in bed for 4 days.  Her back has had flared 3x since but not severe. This time is similiar to the first. She is unable to get comfortable to sleep. Sitting can make worse, rolling over in bed, bending over. No radiation of pain/numbness to buttocks or extremities. No back injury or surgery. No bladder or bowel difficulties. She has tried advil and it did not work and heat therapy. She was standing in front of the bathroom mirror, when she states that her and it just grabbed causing her to flinch. She states she was not bending, stretching or lifting  anything.   Depression screen [facility] 2/9 10/31/2020 05/04/2019 05/22/2018 10/30/2017 05/17/2017  Decreased Interest 0 0 0 0 0  Down, Depressed, Hopeless 0 - 0 0 0  PHQ - 2 Score 0 0 0 0 0    Allergies  Allergen Reactions   Amoxicillin Hives   Sulfa Antibiotics Rash    Childhood rxn   Social History   Social History Narrative   Relocated to [place] from [place]. Due to husband job transfer. 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 She lives with her husband. She has 2 grown sons- oldest recently graduated from college and took a job in [place].; youngest just graduated HS & ia attending [employer].      Has had regular preventive care in [place]. At [facility] w/Dr. Alfonse Spruce. Only health concern is insomnia that developed about 5 years ago & has been treated with [place]. She has not practiced sleep hygiene.      Occasionally drinks sweet tea.    Past Medical History:  Diagnosis Date   Anemia    PAST HX- ON IRON    Arthritis    neck   Cataract    BEGINNING    COVID-19 virus infection 05/2019   Diverticulosis of colon    MILD   Elevated cholesterol with elevated triglycerides  Eustachian tube dysfunction, left 05/22/2018   Family history of breast cancer    Family history of cervical cancer    GERD (gastroesophageal reflux disease)    History of rectocele    Insomnia    started ambien 5 ya.   Osteopenia    --left femur   Periodic heart flutter    SVD (spontaneous vaginal delivery)    x 2   Urinary incontinence    Past Surgical History:  Procedure Laterality Date   BLADDER SUSPENSION N/A 11/10/2013   Procedure: TRANSVAGINAL TAPE (TVT) PROCEDURE;  Surgeon: Jamey Reas de Berton Lan, MD;  Location: [facility];  Service: Gynecology;  Laterality: N/A;   COLONOSCOPY  2011   [place] NORMAL    CYSTOSCOPY N/A 11/10/2013   Procedure: CYSTOSCOPY;  Surgeon: Jamey Reas de Berton Lan, MD;   Location: [facility];  Service: Gynecology;  Laterality: N/A;   IRRIGATION AND DEBRIDEMENT SEBACEOUS CYST  03/2017   VAGINAL HYSTERECTOMY  11/11    Vag cuff, mild rectocele   WISDOM TOOTH EXTRACTION     Family History  Problem Relation Age of Onset   Arthritis Mother    Hypertension Mother    Hyperlipidemia Mother    Heart disease Mother 22       dec   Hypertension Father    Hyperlipidemia Father    Heart disease Father 81       smoked for 10 yrs., no muscle damage, 1 MI    Pulmonary fibrosis Father        diagnosed end stage, cause unkn.   Urolithiasis Father    Diverticulosis Sister    Breast cancer Sister 41   Heart disease Brother    Hyperlipidemia Brother    Obesity Brother    Heart attack Brother 90       Passed away at 38 of heart attack   Hyperlipidemia Brother    Hypertension Brother    Kidney Stones Brother    Anti-cardiolipin syndrome Brother    Arthritis Maternal Grandmother    Cervical cancer Maternal Grandmother        diagnosed in her 43s or 82s   Arthritis Paternal Grandmother    Diabetes Son    Asthma Son    Seizures Son    Asthma Son    Colon cancer Neg Hx    Colon polyps Neg Hx    Esophageal cancer Neg Hx    Rectal cancer Neg Hx    Stomach cancer Neg Hx    Allergies as of 03/20/2021       Reactions   Amoxicillin Hives   Sulfa Antibiotics Rash   Childhood rxn        Medication List        Accurate as of March 20, 2021  2:50 PM. If you have any questions, ask your nurse or doctor.          atorvastatin 40 MG tablet Commonly known as: LIPITOR Take 1 tablet (40 mg total) by mouth daily.   baclofen 10 MG tablet Commonly known as: LIORESAL Take 1 tablet (10 mg total) by mouth 2 (two) times daily.   Caltrate 600+D Plus Minerals 600-800 MG-UNIT Tabs Take by mouth.   cyclobenzaprine 10 MG tablet Commonly known as: FLEXERIL Take 1 tablet (10 mg total) by mouth 3 (three) times daily as needed for muscle spasms. Started by: Howard Pouch, DO   fenofibrate 145 MG tablet Commonly known as: TRICOR Take 1 tablet (145 mg  total) by mouth daily.   Fusion Plus Caps Take 1 capsule by mouth daily.   Omega 3 1200 MG Caps Take 2 capsules by mouth.   pantoprazole 20 MG tablet Commonly known as: PROTONIX Take 1 tablet (20 mg total) by mouth daily.   predniSONE 20 MG tablet Commonly known as: DELTASONE 60 mg x3d, 40 mg x3d, 20 mg x2d, 10 mg x2d Started by: Howard Pouch, DO   spironolactone 50 MG tablet Commonly known as: ALDACTONE Take 50 mg by mouth 2 (two) times daily.   tamoxifen 20 MG tablet Commonly known as: NOLVADEX Take 20 mg by mouth daily.   zolpidem 5 MG tablet Commonly known as: AMBIEN Take 0.5-1 tablets (2.5-5 mg total) by mouth at bedtime as needed for sleep.        All past medical history, surgical history, allergies, family history, immunizations andmedications were updated in the EMR today and reviewed under the history and medication portions of their EMR.     ROS: Negative, with the exception of above mentioned in HPI   Objective:  BP 105/67   Pulse 73   Temp 98.1 F (36.7 C) (Oral)   Wt 145 lb (65.8 kg)   LMP 07/30/2009 (Within Years)   SpO2 95%   BMI 24.89 kg/m  Body mass index is 24.89 kg/m. Gen: Afebrile. No acute distress. Nontoxic in appearance, well developed, well nourished.  HENT: AT. [place].  Eyes:Pupils Equal Round Reactive to light, Extraocular movements intact,  Conjunctiva without redness, discharge or icterus. MSK: no erythema, lumbar spasm present. Guarded gait and sitting.  Skin: no rashes, purpura or petechiae.  Neuro: Normal gait. PERLA. EOMi. Alert. Oriented x3  Psych: Normal affect, dress and demeanor. Normal speech. Normal thought content and judgment.  No results found. No results found. No results found for this or any previous visit (from the past 24 hour(s)).  Assessment/Plan: [name] is a 60 y.o. female present for OV for  Bilateral low  back pain without sciatica - Discussed management- responded well to steroid and flexeril in the past.  - IM Depo-Medrol today, patient is then to start prednisone taper tomorrow.  - Flexeril, nightly for the next 5-7 nights. Can take throughout the day if able to tolerate.  - Follow-up 2 weeks, sooner if not improving. Patient to start light stretches in one week, using pain as her guide is able to tolerate  Reviewed expectations re: course of current medical issues. Discussed self-management of symptoms. Outlined signs and symptoms indicating need for more acute intervention. Patient verbalized understanding and all questions were answered. Patient received an After-Visit Summary.    No orders of the defined types were placed in this encounter.  Meds ordered this encounter  Medications   cyclobenzaprine (FLEXERIL) 10 MG tablet    Sig: Take 1 tablet (10 mg total) by mouth 3 (three) times daily as needed for muscle spasms.    Dispense:  30 tablet    Refill:  1   predniSONE (DELTASONE) 20 MG tablet    Sig: 60 mg x3d, 40 mg x3d, 20 mg x2d, 10 mg x2d    Dispense:  18 tablet    Refill:  0   methylPREDNISolone acetate (DEPO-MEDROL) injection 80 mg   Referral Orders  No referral(s) requested today     Note is dictated utilizing voice recognition software. Although note has been proof read prior to signing, occasional typographical errors still can be missed. If any questions arise, please do not hesitate to  call for verification.   electronically signed by:  Howard Pouch, DO  [place]

## 2021-03-20 NOTE — Patient Instructions (Signed)
Lumbar Strain A lumbar strain, which is sometimes called a low-back strain, is a stretch or tear in a muscle or the strong cords of tissue that attach muscle to bone (tendons) in the lower back (lumbar spine). This type of injury occurs when muscles or tendons are torn or are stretchedbeyond their limits. Lumbar strains can range from mild to severe. Mild strains may involve stretching a muscle or tendon without tearing it. These may heal in 1-2 weeks. More severe strains involve tearing of muscle fibers or tendons. These willcause more pain and may take 6-8 weeks to heal. What are the causes? This condition may be caused by: Trauma, such as a fall or a hit to the body. Twisting or overstretching the back. This may result from doing activities that need a lot of energy, such as lifting heavy objects. What increases the risk? This injury is more common in: Athletes. People with obesity. People who do repeated lifting, bending, or other movements that involve their back. What are the signs or symptoms? Symptoms of this condition may include: Sharp or dull pain in the lower back that does not go away. The pain may extend to the buttocks. Stiffness or limited range of motion. Sudden muscle tightening (spasms). How is this diagnosed? This condition may be diagnosed based on: Your symptoms. Your medical history. A physical exam. Imaging tests, such as: X-rays. MRI. How is this treated? Treatment for this condition may include: Rest. Applying heat and cold to the affected area. Over-the-counter medicines to help relieve pain and inflammation, such as NSAIDs. Prescription pain medicine and muscle relaxants may be needed for a short time. Physical therapy. Follow these instructions at home: Managing pain, stiffness, and swelling     If directed, put ice on the injured area during the first 24 hours after your injury. Put ice in a plastic bag. Place a towel between your skin and the  bag. Leave the ice on for 20 minutes, 2-3 times a day. If directed, apply heat to the affected area as often as told by your health care provider. Use the heat source that your health care provider recommends, such as a moist heat pack or a heating pad. Place a towel between your skin and the heat source. Leave the heat on for 20-30 minutes. Remove the heat if your skin turns bright red. This is especially important if you are unable to feel pain, heat, or cold. You may have a greater risk of getting burned. Activity Rest and return to your normal activities as told by your health care provider. Ask your health care provider what activities are safe for you. Do exercises as told by your health care provider. Medicines Take over-the-counter and prescription medicines only as told by your health care provider. Ask your health care provider if the medicine prescribed to you: Requires you to avoid driving or using heavy machinery. Can cause constipation. You may need to take these actions to prevent or treat constipation: Drink enough fluid to keep your urine pale yellow. Take over-the-counter or prescription medicines. Eat foods that are high in fiber, such as beans, whole grains, and fresh fruits and vegetables. Limit foods that are high in fat and processed sugars, such as fried or sweet foods. Injury prevention To prevent a future low-back injury: Always warm up properly before physical activity or sports. Cool down and stretch after being active. Use correct form when playing sports and lifting heavy objects. Bend your knees before you lift heavy objects. Use  good posture when sitting and standing. Stay physically fit and keep a healthy weight. Do at least 150 minutes of moderate-intensity exercise each week, such as brisk walking or water aerobics. Do strength exercises at least 2 times each week.  General instructions Do not use any products that contain nicotine or tobacco, such as  cigarettes, e-cigarettes, and chewing tobacco. If you need help quitting, ask your health care provider. Keep all follow-up visits as told by your health care provider. This is important. Contact a health care provider if: Your back pain does not improve after 6 weeks of treatment. Your symptoms get worse. Get help right away if: Your back pain is severe. You are unable to stand or walk. You develop pain in your legs. You develop weakness in your buttocks or legs. You have difficulty controlling when you urinate or when you have a bowel movement. You have frequent, painful, or bloody urination. You have a temperature over 101.35F (38.3C) Summary A lumbar strain, which is sometimes called a low-back strain, is a stretch or tear in a muscle or the strong cords of tissue that attach muscle to bone (tendons) in the lower back (lumbar spine). This type of injury occurs when muscles or tendons are torn or are stretched beyond their limits. Rest and return to your normal activities as told by your health care provider. If directed, apply heat and ice to the affected area as often as told by your health care provider. Take over-the-counter and prescription medicines only as told by your health care provider. Contact a health care provider if you have new or worsening symptoms. This information is not intended to replace advice given to you by your health care provider. Make sure you discuss any questions you have with your healthcare provider. Document Revised: 05/15/2018 Document Reviewed: 05/15/2018 Elsevier Patient Education  2022 [employer].

## 2021-04-12 ENCOUNTER — Other Ambulatory Visit: Payer: Self-pay

## 2021-04-12 ENCOUNTER — Ambulatory Visit (INDEPENDENT_AMBULATORY_CARE_PROVIDER_SITE_OTHER): Payer: BC Managed Care – PPO | Admitting: Family Medicine

## 2021-04-12 VITALS — BP 115/75 | HR 86 | Temp 98.1°F | Wt 144.4 lb

## 2021-04-12 DIAGNOSIS — M545 Low back pain, unspecified: Secondary | ICD-10-CM

## 2021-04-12 NOTE — Patient Instructions (Signed)
  I am glad you are feeling better.  Perform stretches as we discussed.

## 2021-04-12 NOTE — Progress Notes (Signed)
This visit occurred during the SARS-CoV-2 public health emergency.  Safety protocols were in place, including screening questions prior to the visit, additional usage of staff PPE, and extensive cleaning of exam room while observing appropriate contact time as indicated for disinfecting solutions.    [name] , August 23, 1960, 60 y.o., female MRN: [id number] Patient Care Team    Relationship Specialty Notifications Start End  Ma Hillock, DO PCP - General Family Medicine  03/29/15   Nunzio Cobbs, MD Consulting Physician Obstetrics and Gynecology  [id number]   Clint Guy (Inactive) Counselor Genetic Counselor  10/29/19   Mauri Pole, MD Consulting Physician Gastroenterology  04/13/20     Chief Complaint  Patient presents with   Follow-up    Strain or lumber region     Subjective: Pt presents for an OV for follow-up on low back pain.   Patient reports her lower back pain has resolved.  It took approximately 10 days with the use of the steroid and muscle relaxer to completely resolve.  She denies any numbness or tingling in her lower extremities.  She denies any recurrence of her back pain.   Prior note: With complaints of lower back pain 2 days duration. Pt reports it is the same pain she has had in the past. She was just sitting down in a chair and she felt her back spasm and lock. She reports pain with transitioning positions.  She denies radiation of pain to extremity. Pain is constant across lower back.    Prior note:  Pt presents for an acute OV with complaints of back pain of 2 days duration.  Pt has had similar episode 10 years ago and back locked up and she could not move . This resulted in her being in bed for 4 days.  Her back has had flared 3x since but not severe. This time is similiar to the first. She is unable to get comfortable to sleep. Sitting can make worse, rolling over in bed, bending over. No radiation of pain/numbness to buttocks or  extremities. No back injury or surgery. No bladder or bowel difficulties. She has tried advil and it did not work and heat therapy. She was standing in front of the bathroom mirror, when she states that her and it just grabbed causing her to flinch. She states she was not bending, stretching or lifting anything.   Depression screen [facility] 2/9 10/31/2020 05/04/2019 05/22/2018 10/30/2017 05/17/2017  Decreased Interest 0 0 0 0 0  Down, Depressed, Hopeless 0 - 0 0 0  PHQ - 2 Score 0 0 0 0 0    Allergies  Allergen Reactions   Amoxicillin Hives   Sulfa Antibiotics Rash    Childhood rxn   Social History   Social History Narrative   Relocated to [place] from [place]. Due to husband job transfer. 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 She lives with her husband. She has 2 grown sons- oldest recently graduated from college and took a job in [place].; youngest just graduated HS & ia attending [employer].      Has had regular preventive care in [place]. At [facility] w/Dr. Alfonse Spruce. Only health concern is insomnia that developed about 5 years ago & has been treated with [place]. She has not practiced sleep hygiene.      Occasionally drinks sweet tea.    Past  Medical History:  Diagnosis Date   Anemia    PAST HX- ON IRON    Arthritis    neck   Cataract    BEGINNING    COVID-19 virus infection 05/2019   Diverticulosis of colon    MILD   Elevated cholesterol with elevated triglycerides    Eustachian tube dysfunction, left 05/22/2018   Family history of breast cancer    Family history of cervical cancer    GERD (gastroesophageal reflux disease)    History of rectocele    Insomnia    started ambien 5 ya.   Osteopenia    --left femur   Periodic heart flutter    SVD (spontaneous vaginal delivery)    x 2   Urinary incontinence    Past Surgical History:  Procedure Laterality Date   BLADDER SUSPENSION N/A 11/10/2013    Procedure: TRANSVAGINAL TAPE (TVT) PROCEDURE;  Surgeon: Jamey Reas de Berton Lan, MD;  Location: [facility];  Service: Gynecology;  Laterality: N/A;   COLONOSCOPY  2011   [place] NORMAL    CYSTOSCOPY N/A 11/10/2013   Procedure: CYSTOSCOPY;  Surgeon: Jamey Reas de Berton Lan, MD;  Location: [facility];  Service: Gynecology;  Laterality: N/A;   IRRIGATION AND DEBRIDEMENT SEBACEOUS CYST  03/2017   VAGINAL HYSTERECTOMY  11/11    Vag cuff, mild rectocele   WISDOM TOOTH EXTRACTION     Family History  Problem Relation Age of Onset   Arthritis Mother    Hypertension Mother    Hyperlipidemia Mother    Heart disease Mother 7       dec   Hypertension Father    Hyperlipidemia Father    Heart disease Father 11       smoked for 10 yrs., no muscle damage, 1 MI    Pulmonary fibrosis Father        diagnosed end stage, cause unkn.   Urolithiasis Father    Diverticulosis Sister    Breast cancer Sister 30   Heart disease Brother    Hyperlipidemia Brother    Obesity Brother    Heart attack Brother 40       Passed away at 39 of heart attack   Hyperlipidemia Brother    Hypertension Brother    Kidney Stones Brother    Anti-cardiolipin syndrome Brother    Arthritis Maternal Grandmother    Cervical cancer Maternal Grandmother        diagnosed in her 58s or 41s   Arthritis Paternal Grandmother    Diabetes Son    Asthma Son    Seizures Son    Asthma Son    Colon cancer Neg Hx    Colon polyps Neg Hx    Esophageal cancer Neg Hx    Rectal cancer Neg Hx    Stomach cancer Neg Hx    Allergies as of 04/12/2021       Reactions   Amoxicillin Hives   Sulfa Antibiotics Rash   Childhood rxn        Medication List        Accurate as of April 12, 2021  2:32 PM. If you have any questions, ask your nurse or doctor.          atorvastatin 40 MG tablet Commonly known as: LIPITOR Take 1 tablet (40 mg total) by mouth daily.   baclofen 10 MG tablet Commonly known as:  LIORESAL Take 1 tablet (10 mg total) by mouth 2 (two) times daily.   Caltrate  600+D Plus Minerals 600-800 MG-UNIT Tabs Take by mouth.   cyclobenzaprine 10 MG tablet Commonly known as: FLEXERIL Take 1 tablet (10 mg total) by mouth 3 (three) times daily as needed for muscle spasms.   fenofibrate 145 MG tablet Commonly known as: TRICOR Take 1 tablet (145 mg total) by mouth daily.   Fusion Plus Caps Take 1 capsule by mouth daily.   Omega 3 1200 MG Caps Take 2 capsules by mouth.   pantoprazole 20 MG tablet Commonly known as: PROTONIX Take 1 tablet (20 mg total) by mouth daily.   predniSONE 20 MG tablet Commonly known as: DELTASONE 60 mg x3d, 40 mg x3d, 20 mg x2d, 10 mg x2d   spironolactone 50 MG tablet Commonly known as: ALDACTONE Take 50 mg by mouth 2 (two) times daily.   tamoxifen 20 MG tablet Commonly known as: NOLVADEX Take 20 mg by mouth daily.   zolpidem 5 MG tablet Commonly known as: AMBIEN Take 0.5-1 tablets (2.5-5 mg total) by mouth at bedtime as needed for sleep.        All past medical history, surgical history, allergies, family history, immunizations andmedications were updated in the EMR today and reviewed under the history and medication portions of their EMR.     ROS: Negative, with the exception of above mentioned in HPI   Objective:  BP 115/75   Pulse 86   Temp 98.1 F (36.7 C)   Wt 144 lb 6.4 oz (65.5 kg)   LMP 07/30/2009 (Within Years)   SpO2 95%   BMI 24.79 kg/m  Body mass index is 24.79 kg/m. Gen: Afebrile. No acute distress.  HENT: AT. [place].  MSK: Lower lumbar spine without erythema, mild bilateral lumbar paraspinal fullness remains with ropiness.  Mild left SI tenderness to palpation.  Normal range of motion without discomfort.  5/5 MS bilaterally.  Neurovascularly intact distally.   No results found. No results found. No results found for this or any previous visit (from the past 24 hour(s)).  Assessment/Plan: DENIYAH PATCHIN  is a 60 y.o. female present for OV for  Bilateral low back pain without sciatica -Patient's symptoms have completely resolved.  She still have some mild ropiness bilateral lower lumbar spine.   -Encouraged routine stretches stretching out hamstrings, quads and lower back.   -Discussed if she has more routine recurrences would consider physical therapy or OMT referral, as well as repeat imaging.  She is agreeable to that plan.   Follow-up as needed  Reviewed expectations re: course of current medical issues. Discussed self-management of symptoms. Outlined signs and symptoms indicating need for more acute intervention. Patient verbalized understanding and all questions were answered. Patient received an After-Visit Summary.    No orders of the defined types were placed in this encounter.  No orders of the defined types were placed in this encounter.  Referral Orders  No referral(s) requested today     Note is dictated utilizing voice recognition software. Although note has been proof read prior to signing, occasional typographical errors still can be missed. If any questions arise, please do not hesitate to call for verification.   electronically signed by:  Howard Pouch, DO  [place]

## 2021-04-13 ENCOUNTER — Encounter: Payer: Self-pay | Admitting: Family Medicine

## 2021-04-25 ENCOUNTER — Ambulatory Visit: Payer: BC Managed Care – PPO | Admitting: Obstetrics and Gynecology

## 2021-04-25 NOTE — Progress Notes (Signed)
60 y.o. G2P2 Married Caucasian female here for annual exam.    Has lost 20 pounds on Noom.  Brother died suddenly November 18, 2020. Atherosclerosis.   Patient complaining of vaginal itching/irritation/some discharge.   Has been on prednisone in August for her back.   Is on Tamoxifen for about one year.   Covid in August.   PCP:   Howard Pouch, DO  Patient's last menstrual period was 07/30/2009 (within years).           Sexually active: No.  The current method of family planning is status post hysterectomy.    Exercising: Yes.     Walking and workouts on facebook Smoker:  no  Health Maintenance: Pap:  2013 Neg History of abnormal Pap:  no MMG: 08-03-20 3D/Neg/BiRads1 Colonoscopy:  11/2019 normal;next 10 years BMD:  1-5/22  Result :Osteopenia of hip TDaP: 04-03-13 Gardasil:   no HIV: donates blood Hep C: donates blood Screening Labs:  PCP.   reports that she has never smoked. She has never used smokeless tobacco. She reports current alcohol use. She reports that she does not use drugs.  Past Medical History:  Diagnosis Date   Anemia    PAST HX- ON IRON    Arthritis    neck   Cataract    BEGINNING    COVID 02/2021   COVID-19 virus infection 05/2019   Diverticulosis of colon    MILD   Elevated cholesterol with elevated triglycerides    Eustachian tube dysfunction, left 05/22/2018   Family history of breast cancer    Family history of cervical cancer    GERD (gastroesophageal reflux disease)    History of COVID-19 02/27/2021   History of rectocele    Insomnia    started ambien 5 ya.   Osteopenia    --left femur   Periodic heart flutter    SVD (spontaneous vaginal delivery)    x 2   Urinary incontinence     Past Surgical History:  Procedure Laterality Date   BLADDER SUSPENSION N/A 11/10/2013   Procedure: TRANSVAGINAL TAPE (TVT) PROCEDURE;  Surgeon: Jamey Reas de Berton Lan, MD;  Location: [facility];  Service: Gynecology;  Laterality: N/A;   COLONOSCOPY  2011    [place] NORMAL    CYSTOSCOPY N/A 11/10/2013   Procedure: CYSTOSCOPY;  Surgeon: Jamey Reas de Berton Lan, MD;  Location: [facility];  Service: Gynecology;  Laterality: N/A;   IRRIGATION AND DEBRIDEMENT SEBACEOUS CYST  03/2017   VAGINAL HYSTERECTOMY  11/11    Vag cuff, mild rectocele   WISDOM TOOTH EXTRACTION      Current Outpatient Medications  Medication Sig Dispense Refill   atorvastatin (LIPITOR) 40 MG tablet Take 1 tablet (40 mg total) by mouth daily. 90 tablet 3   baclofen (LIORESAL) 10 MG tablet Take 1 tablet (10 mg total) by mouth 2 (two) times daily. 180 each 1   Calcium Carbonate-Vit D-Min (CALTRATE 600+D PLUS MINERALS) 600-800 MG-UNIT TABS Take by mouth.     cyclobenzaprine (FLEXERIL) 10 MG tablet Take 1 tablet (10 mg total) by mouth 3 (three) times daily as needed for muscle spasms. 30 tablet 1   fenofibrate (TRICOR) 145 MG tablet Take 1 tablet (145 mg total) by mouth daily. 90 tablet 3   Iron-FA-B Cmp-C-Biot-Probiotic (FUSION PLUS) CAPS Take 1 capsule by mouth daily.     Omega 3 1200 MG CAPS Take 2 capsules by mouth.     pantoprazole (PROTONIX) 20 MG tablet Take 1 tablet (20 mg  total) by mouth daily. 90 tablet 3   spironolactone (ALDACTONE) 50 MG tablet Take 50 mg by mouth 2 (two) times daily.     tamoxifen (NOLVADEX) 20 MG tablet Take 20 mg by mouth daily.     zolpidem (AMBIEN) 5 MG tablet Take 0.5-1 tablets (2.5-5 mg total) by mouth at bedtime as needed for sleep. 90 tablet 1   No current facility-administered medications for this visit.    Family History  Problem Relation Age of Onset   Arthritis Mother    Hypertension Mother    Hyperlipidemia Mother    Heart disease Mother 5       dec   Hypertension Father    Hyperlipidemia Father    Heart disease Father 3       smoked for 10 yrs., no muscle damage, 1 MI    Pulmonary fibrosis Father        diagnosed end stage, cause unkn.   Urolithiasis Father    Diverticulosis Sister    Breast cancer Sister 24    Heart disease Brother    Hyperlipidemia Brother    Obesity Brother    Heart attack Brother 12       Passed away at 72 of heart attack   Hyperlipidemia Brother    Hypertension Brother    Kidney Stones Brother    Anti-cardiolipin syndrome Brother    Arthritis Maternal Grandmother    Cervical cancer Maternal Grandmother        diagnosed in her 26s or 7s   Arthritis Paternal Grandmother    Diabetes Son    Asthma Son    Seizures Son    Asthma Son    Colon cancer Neg Hx    Colon polyps Neg Hx    Esophageal cancer Neg Hx    Rectal cancer Neg Hx    Stomach cancer Neg Hx     Review of Systems  Genitourinary:        Vaginal irritation/itching  All other systems reviewed and are negative.  Exam:   BP 108/70   Pulse 84   Ht 5' 3.25" (1.607 m)   Wt 145 lb (65.8 kg)   LMP 07/30/2009 (Within Years)   SpO2 98%   BMI 25.48 kg/m     General appearance: alert, cooperative and appears stated age Head: normocephalic, without obvious abnormality, atraumatic Neck: no adenopathy, supple, symmetrical, trachea midline and thyroid normal to inspection and palpation Lungs: clear to auscultation bilaterally Breasts: normal appearance, no masses or tenderness, No nipple retraction or dimpling, No nipple discharge or bleeding, No axillary adenopathy Heart: regular rate and rhythm Abdomen: soft, non-tender; no organomegaly, left lower abdominal wall subcutaneous mass 2.5 cm.  Extremities: extremities normal, atraumatic, no cyanosis or edema Skin: skin color, texture, turgor normal. No rashes or lesions Lymph nodes: cervical, supraclavicular, and axillary nodes normal. Neurologic: grossly normal  Pelvic: External genitalia:  no lesions              No abnormal inguinal nodes palpated.              Urethra:  normal appearing urethra with no masses, tenderness or lesions              Bartholins and Skenes: normal                 Vagina: normal appearing vagina with normal color and discharge,  no lesions              Cervix:  absent  Pap taken: no Bimanual Exam:  Uterus:  absent              Adnexa: no mass, fullness, tenderness              Rectal exam: yes.  Confirms.              Anus:  normal sphincter tone, no lesions  Chaperone was present for exam:  Estill Bamberg, CMA  Assessment:   Well woman visit with gynecologic exam. Status post TVH.  Status post TVT.  Vaginitis. Increased lifetime risk of breast cancer - 25% by TC model.  Sister tested  Negative, so genetic testing declined by patient.  On Tamoxifen.  Osteopenia. Very mild.  Lipomas. Elevated cholesterol and TG. FH CAD.   Plan: Mammogram screening discussed. Self breast awareness reviewed. Pap and HR HPV as above. Guidelines for Calcium, Vitamin D, regular exercise program including cardiovascular and weight bearing exercise. Wet prep:  negative yeast, clue cells, and trichomonas.  I dicussed vaginal atrophy, water based lubricants, cooking oils, and vit E vaginal suppositories as treatment options.  I did also mention local vaginal estrogen treatment as long as she is using Tamoxifen.  Labs with PCP.  Flu vaccine with PCP.  She will schedule her yearly visit with oncology.  BMD in 2024 or 2025.  Follow up annually and prn.   After visit summary provided.

## 2021-04-26 ENCOUNTER — Encounter: Payer: Self-pay | Admitting: Obstetrics and Gynecology

## 2021-04-26 ENCOUNTER — Other Ambulatory Visit: Payer: Self-pay

## 2021-04-26 ENCOUNTER — Ambulatory Visit (INDEPENDENT_AMBULATORY_CARE_PROVIDER_SITE_OTHER): Payer: BC Managed Care – PPO | Admitting: Obstetrics and Gynecology

## 2021-04-26 VITALS — BP 108/70 | HR 84 | Ht 63.25 in | Wt 145.0 lb

## 2021-04-26 DIAGNOSIS — N952 Postmenopausal atrophic vaginitis: Secondary | ICD-10-CM

## 2021-04-26 DIAGNOSIS — Z01419 Encounter for gynecological examination (general) (routine) without abnormal findings: Secondary | ICD-10-CM | POA: Diagnosis not present

## 2021-04-26 DIAGNOSIS — N760 Acute vaginitis: Secondary | ICD-10-CM

## 2021-04-26 LAB — WET PREP FOR TRICH, YEAST, CLUE

## 2021-04-26 NOTE — Patient Instructions (Addendum)
EXERCISE AND DIET:  We recommended that you start or continue a regular exercise program for good health. Regular exercise means any activity that makes your heart beat faster and makes you sweat.  We recommend exercising at least 30 minutes per day at least 3 days a week, preferably 4 or 5.  We also recommend a diet low in fat and sugar.  Inactivity, poor dietary choices and obesity can cause diabetes, heart attack, stroke, and kidney damage, among others.    ALCOHOL AND SMOKING:  Women should limit their alcohol intake to no more than 7 drinks/beers/glasses of wine (combined, not each!) per week. Moderation of alcohol intake to this level decreases your risk of breast cancer and liver damage. And of course, no recreational drugs are part of a healthy lifestyle.  And absolutely no smoking or even second hand smoke. Most people know smoking can cause heart and lung diseases, but did you know it also contributes to weakening of your bones? Aging of your skin?  Yellowing of your teeth and nails?  CALCIUM AND VITAMIN D:  Adequate intake of calcium and Vitamin D are recommended.  The recommendations for exact amounts of these supplements seem to change often, but generally speaking 600 mg of calcium (either carbonate or citrate) and 800 units of Vitamin D per day seems prudent. Certain women may benefit from higher intake of Vitamin D.  If you are among these women, your doctor will have told you during your visit.    PAP SMEARS:  Pap smears, to check for cervical cancer or precancers,  have traditionally been done yearly, although recent scientific advances have shown that most women can have pap smears less often.  However, every woman still should have a physical exam from her gynecologist every year. It will include a breast check, inspection of the vulva and vagina to check for abnormal growths or skin changes, a visual exam of the cervix, and then an exam to evaluate the size and shape of the uterus and  ovaries.  And after 62 years of age, a rectal exam is indicated to check for rectal cancers. We will also provide age appropriate advice regarding health maintenance, like when you should have certain vaccines, screening for sexually transmitted diseases, bone density testing, colonoscopy, mammograms, etc.   MAMMOGRAMS:  All women over 40 years old should have a yearly mammogram. Many facilities now offer a "3D" mammogram, which may cost around $50 extra out of pocket. If possible,  we recommend you accept the option to have the 3D mammogram performed.  It both reduces the number of women who will be called back for extra views which then turn out to be normal, and it is better than the routine mammogram at detecting truly abnormal areas.    COLONOSCOPY:  Colonoscopy to screen for colon cancer is recommended for all women at age 50.  We know, you hate the idea of the prep.  We agree, BUT, having colon cancer and not knowing it is worse!!  Colon cancer so often starts as a polyp that can be seen and removed at colonscopy, which can quite literally save your life!  And if your first colonoscopy is normal and you have no family history of colon cancer, most women don't have to have it again for 10 years.  Once every ten years, you can do something that may end up saving your life, right?  We will be happy to help you get it scheduled when you are ready.    Be sure to check your insurance coverage so you understand how much it will cost.  It may be covered as a preventative service at no cost, but you should check your particular policy.    Atrophic Vaginitis Atrophic vaginitis is a condition in which the tissues that line the vagina become dry and thin. This condition is most common in women who have stopped having regular menstrual periods (are in menopause). This usually starts when a woman is 65 to 62 years old. That is the time when a woman's estrogen levels begin to decrease. Estrogen is a female hormone. It  helps to keep the tissues of the vagina moist. It stimulates the vagina to produce a clear fluid that lubricates the vagina for sex. This fluid also protects the vagina from infection. Lack of estrogen can cause the lining of the vagina to get thinner and dryer. The vagina may also shrink in size. It may become less elastic. Atrophic vaginitis tends to get worse over time as a woman's estrogen level drops. What are the causes? This condition is caused by the normal drop in estrogen that happens around the time of menopause. What increases the risk? Certain conditions or situations may lower a woman's estrogen level, leading to a higher risk for atrophic vaginitis. You are more likely to develop this condition if: You are taking medicines that block estrogen. You have had your ovaries removed. You are being treated for cancer with radiation or medicines (chemotherapy). You have given birth or are breastfeeding. You are older than age 64. You smoke. What are the signs or symptoms? Symptoms of this condition include: Pain, soreness, a feeling of pressure, or bleeding during sex (dyspareunia). Vaginal burning, irritation, or itching. Pain or bleeding when a speculum is used in a vaginal exam. Having burning pain while urinating. Vaginal discharge. In some cases, there are no symptoms. How is this diagnosed? This condition is diagnosed based on your medical history and a physical exam. This will include a pelvic exam that checks the vaginal tissues. Though rare, you may also have other tests, including: A urine test. A test that checks the acid balance in your vagina (acid balance test). How is this treated? Treatment for this condition depends on how severe your symptoms are. Treatment may include: Using an over-the-counter vaginal lubricant before sex. Using a long-acting vaginal moisturizer. Using low-dose estrogen for moderate to severe symptoms that do not respond to other treatments.  Options include creams, tablets, and inserts (vaginal rings). Before you use a vaginal estrogen, tell your health care provider if you have a history of: Breast cancer. Endometrial cancer. Blood clots. If you are not sexually active and your symptoms are very mild, you may not need treatment. Follow these instructions at home: Medicines Take over-the-counter and prescription medicines only as told by your health care provider. Do not use herbal or alternative medicines unless your health care provider says that you can. Use over-the-counter creams, lubricants, or moisturizers for dryness only as told by your health care provider. General instructions If your atrophic vaginitis is caused by menopause, discuss all of your menopause symptoms and treatment options with your health care provider. Do not douche. Do not use products that can make your vagina dry. These include: Scented feminine sprays. Scented tampons. Scented soaps. Vaginal sex can help to improve blood flow and elasticity of vaginal tissue. If you choose to have sex and it hurts, try using a water-soluble lubricant or moisturizer right before having sex. Contact a  health care provider if: Your discharge looks different than normal. Your vagina has an unusual smell. You have new symptoms. Your symptoms do not improve with treatment. Your symptoms get worse. Summary Atrophic vaginitis is a condition in which the tissues that line the vagina become dry and thin. It is most common in women who have stopped having regular menstrual periods (are in menopause). Treatment options include using vaginal lubricants and low-dose vaginal estrogen. Contact a health care provider if your vagina has an unusual smell, or if your symptoms get worse or do not improve after treatment. This information is not intended to replace advice given to you by your health care provider. Make sure you discuss any questions you have with your health care  provider. Document Revised: 01/14/2020 Document Reviewed: 01/14/2020 Elsevier Patient Education  [place].  Consider vitamin E vaginal suppositories purchased through [employer]. Cooking oils also help.

## 2021-05-02 ENCOUNTER — Ambulatory Visit (INDEPENDENT_AMBULATORY_CARE_PROVIDER_SITE_OTHER): Payer: BC Managed Care – PPO | Admitting: Family Medicine

## 2021-05-02 ENCOUNTER — Encounter: Payer: Self-pay | Admitting: Family Medicine

## 2021-05-02 ENCOUNTER — Other Ambulatory Visit: Payer: Self-pay

## 2021-05-02 VITALS — BP 106/68 | HR 74 | Temp 98.1°F | Ht 63.0 in | Wt 144.0 lb

## 2021-05-02 DIAGNOSIS — F5101 Primary insomnia: Secondary | ICD-10-CM

## 2021-05-02 DIAGNOSIS — E782 Mixed hyperlipidemia: Secondary | ICD-10-CM

## 2021-05-02 DIAGNOSIS — R7303 Prediabetes: Secondary | ICD-10-CM

## 2021-05-02 DIAGNOSIS — Z79899 Other long term (current) drug therapy: Secondary | ICD-10-CM

## 2021-05-02 DIAGNOSIS — K219 Gastro-esophageal reflux disease without esophagitis: Secondary | ICD-10-CM

## 2021-05-02 DIAGNOSIS — E559 Vitamin D deficiency, unspecified: Secondary | ICD-10-CM | POA: Diagnosis not present

## 2021-05-02 DIAGNOSIS — I700 Atherosclerosis of aorta: Secondary | ICD-10-CM

## 2021-05-02 DIAGNOSIS — Z8249 Family history of ischemic heart disease and other diseases of the circulatory system: Secondary | ICD-10-CM | POA: Diagnosis not present

## 2021-05-02 DIAGNOSIS — Z23 Encounter for immunization: Secondary | ICD-10-CM

## 2021-05-02 DIAGNOSIS — M8580 Other specified disorders of bone density and structure, unspecified site: Secondary | ICD-10-CM

## 2021-05-02 DIAGNOSIS — R931 Abnormal findings on diagnostic imaging of heart and coronary circulation: Secondary | ICD-10-CM

## 2021-05-02 DIAGNOSIS — G4700 Insomnia, unspecified: Secondary | ICD-10-CM

## 2021-05-02 LAB — LIPID PANEL
Cholesterol: 111 mg/dL (ref 0–200)
HDL: 42.4 mg/dL (ref >39.00)
LDL Cholesterol: 49 mg/dL (ref 0–99)
NonHDL: 68.71
Total CHOL/HDL Ratio: 3
Triglycerides: 101 mg/dL (ref 0.0–149.0)
VLDL: 20.2 mg/dL (ref 0.0–40.0)

## 2021-05-02 LAB — HEPATIC FUNCTION PANEL
ALT: 15 U/L (ref 0–35)
AST: 16 U/L (ref 0–37)
Albumin: 4.4 g/dL (ref 3.5–5.2)
Alkaline Phosphatase: 31 U/L — ABNORMAL LOW (ref 39–117)
Bilirubin, Direct: 0.1 mg/dL (ref 0.0–0.3)
Total Bilirubin: 0.4 mg/dL (ref 0.2–1.2)
Total Protein: 6.4 g/dL (ref 6.0–8.3)

## 2021-05-02 MED ORDER — FENOFIBRATE 145 MG PO TABS: 145.0000 mg | ORAL_TABLET | Freq: Every day | ORAL | 3 refills | Status: DC

## 2021-05-02 MED ORDER — ZOLPIDEM TARTRATE 5 MG PO TABS: 2.5000 mg | ORAL_TABLET | Freq: Every evening | ORAL | 1 refills | Status: DC | PRN

## 2021-05-02 MED ORDER — PANTOPRAZOLE SODIUM 20 MG PO TBEC: 20.0000 mg | DELAYED_RELEASE_TABLET | Freq: Every day | ORAL | 3 refills | Status: DC

## 2021-05-02 MED ORDER — BACLOFEN 10 MG PO TABS: 10.0000 mg | ORAL_TABLET | Freq: Two times a day (BID) | ORAL | 1 refills | Status: DC

## 2021-05-02 NOTE — Patient Instructions (Signed)
Great to see you today.  I have refilled the medication(s) we provide.   If labs were collected, we will inform you of lab results once received either by echart message or telephone call.   - echart message- for normal results that have been seen by the patient already.   - telephone call: abnormal results or if patient has not viewed results in their echart.   Preventing High Cholesterol Cholesterol is a Larkey, waxy substance similar to fat that the human body needs to help build cells. The liver makes all the cholesterol that a person's body needs. Having high cholesterol (hypercholesterolemia) increases your risk for heart disease and stroke. Extra or excess cholesterol comes from the food that you eat. High cholesterol can often be prevented with diet and lifestyle changes. If you already have high cholesterol, you can control it with diet, lifestyle changes, and medicines. How can high cholesterol affect me? If you have high cholesterol, fatty deposits (plaques) may build up on the walls of your blood vessels. The blood vessels that carry blood away from your heart are called arteries. Plaques make the arteries narrower and stiffer. This in turn can: Restrict or block blood flow and cause blood clots to form. Increase your risk for heart attack and stroke. What can increase my risk for high cholesterol? This condition is more likely to develop in people who: Eat foods that are high in saturated fat or cholesterol. Saturated fat is mostly found in foods that come from animal sources. Are overweight. Are not getting enough exercise. Use products that contain nicotine or tobacco, such as cigarettes, e-cigarettes, and chewing tobacco. Have a family history of high cholesterol (familial hypercholesterolemia). What actions can I take to prevent this? Nutrition  Eat less saturated fat. Avoid trans fats (partially hydrogenated oils). These are often found in margarine and in some baked  goods, fried foods, and snacks bought in packages. Avoid precooked or cured meat, such as bacon, sausages, or meat loaves. Avoid foods and drinks that have added sugars. Eat more fruits, vegetables, and whole grains. Choose healthy sources of protein, such as fish, poultry, lean cuts of red meat, beans, peas, lentils, and nuts. Choose healthy sources of fat, such as: Nuts. Vegetable oils, especially olive oil. Fish that have healthy fats, such as omega-3 fatty acids. These fish include mackerel or salmon. Lifestyle Lose weight if you are overweight. Maintaining a healthy body mass index (BMI) can help prevent or control high cholesterol. It can also lower your risk for diabetes and high blood pressure. Ask your health care provider to help you with a diet and exercise plan to lose weight safely. Do not use any products that contain nicotine or tobacco. These products include cigarettes, chewing tobacco, and vaping devices, such as e-cigarettes. If you need help quitting, ask your health care provider. Alcohol use Do not drink alcohol if: Your health care provider tells you not to drink. You are pregnant, may be pregnant, or are planning to become pregnant. If you drink alcohol: Limit how much you have to: 0-1 drink a day for women. 0-2 drinks a day for men. Know how much alcohol is in your drink. In the [place] one drink equals one 12 oz bottle of beer (355 mL), one 5 oz glass of wine (148 mL), or one 1 oz glass of hard liquor (44 mL). Activity  Get enough exercise. Do exercises as told by your health care provider. Each week, do at least 150 minutes of exercise that  takes a medium level of effort (moderate-intensity exercise). This kind of exercise: Makes your heart beat faster while allowing you to still be able to talk. Can be done in short sessions several times a day or longer sessions a few times a week. For example, on 5 days each week, you could walk fast or ride your bike 3 times a  day for 10 minutes each time. Medicines Your health care provider may recommend medicines to help lower cholesterol. This may be a medicine to lower the amount of cholesterol that your liver makes. You may need medicine if: Diet and lifestyle changes have not lowered your cholesterol enough. You have high cholesterol and other risk factors for heart disease or stroke. Take over-the-counter and prescription medicines only as told by your health care provider. General information Manage your risk factors for high cholesterol. Talk with your health care provider about all your risk factors and how to lower your risk. Manage other conditions that you have, such as diabetes or high blood pressure (hypertension). Have blood tests to check your cholesterol levels at regular points in time as told by your health care provider. Keep all follow-up visits. This is important. Where to find more information American Heart Association: www.heart.org National Heart, Lung, and Blood Institute: https://wilson-eaton.com/ Summary High cholesterol increases your risk for heart disease and stroke. By keeping your cholesterol level low, you can reduce your risk for these conditions. High cholesterol can often be prevented with diet and lifestyle changes. Work with your health care provider to manage your risk factors, and have your blood tested regularly. This information is not intended to replace advice given to you by your health care provider. Make sure you discuss any questions you have with your health care provider. Document Revised: 09/19/2020 Document Reviewed: 09/19/2020 Elsevier Patient Education  2022 [employer].

## 2021-05-02 NOTE — Progress Notes (Signed)
This visit occurred during the SARS-CoV-2 public health emergency.  Safety protocols were in place, including screening questions prior to the visit, additional usage of staff PPE, and extensive cleaning of exam room while observing appropriate contact time as indicated for disinfecting solutions.    Patient ID: [name], female  DOB: 1960/08/20, 60 y.o.   MRN: [id number] Patient Care Team    Relationship Specialty Notifications Start End  Ma Hillock, DO PCP - General Family Medicine  03/29/15   Nunzio Cobbs, MD Consulting Physician Obstetrics and Gynecology  10/29/33   Clint Guy (Inactive) Counselor Genetic Counselor  10/29/19   Mauri Pole, MD Consulting Physician Gastroenterology  04/13/20     Chief Complaint  Patient presents with   Anxiety    CMC; pt is fasting    Subjective: [name] is a 60 y.o.  Female  present for cmc. All past medical history, surgical history, allergies, family history, immunizations, medications and social history were updated in the electronic medical record today. All recent labs, ED visits and hospitalizations within the last year were reviewed.  Insomnia/neck pain Patient reports insomnia is well controlled on Ambien 2.5-5 mg daily at bedtime. She has been on this medication since ~2012. She reports she routinely needs this medication- sometimes she takes a half tab.. She denies negative side effects. She reports she has continued to use the baclofen BID for neck pain and feels it has been helpful.  She was seen a few weeks ago for back pain and reports that it has since resolved.   Mixed hyperlipidemia/ On statin therapy/Family history of premature coronary artery disease/ Patient reports she is tolerating atorvastatin 40 mg daily, fenofibrate 145 mg daily and she is taking 3600 mg of omega-3 supplement daily. LDL goal less than 70. CT cardiac scoring 02/07/2021: FINDINGS: Coronary Calcium Score: Left main:  0 Left anterior descending artery: 0.9 Left circumflex artery: 1.5 Right coronary artery: 0 Total: 2.4 Percentile: 63rd Pericardium: Normal. Ascending Aorta: Normal caliber. Non-cardiac: See separate report from [facility]. IMPRESSION: Coronary calcium score of 2.4. This was 63rd percentile for age-, race-, and sex-matched controls.  Prediabetes HgB A1c  6.1>> 5.8> 5.8> 5.7 last check. Exercising daily again  Depression screen [facility] 2/9 05/02/2021 10/31/2020 05/04/2019 05/22/2018 10/30/2017  Decreased Interest 0 0 0 0 0  Down, Depressed, Hopeless 0 0 - 0 0  PHQ - 2 Score 0 0 0 0 0  Altered sleeping 0 - - - -  Tired, decreased energy 0 - - - -  Change in appetite 0 - - - -  Feeling bad or failure about yourself  0 - - - -  Trouble concentrating 0 - - - -  Moving slowly or fidgety/restless 0 - - - -  Suicidal thoughts 0 - - - -  PHQ-9 Score 0 - - - -   GAD 7 : Generalized Anxiety Score 05/02/2021  Nervous, Anxious, on Edge 0  Control/stop worrying 0  Worry too much - different things 0  Trouble relaxing 0  Restless 0  Easily annoyed or irritable 0     Immunization History  Administered Date(s) Administered   Influenza Inj Mdck Quad Pf 04/18/2018   Influenza, Quadrivalent, Recombinant, Inj, Pf 04/30/2019   Influenza,inj,Quad PF,6+ Mos 05/17/2017, 04/13/2020, 05/02/2021   Influenza-Unspecified 06/20/2015, 05/25/2016, 04/30/2019   PFIZER(Purple Top)SARS-COV-2 Vaccination 10/03/2019, 10/24/2019, 06/18/2020   PPD Test 03/16/2014   Tdap 04/03/2013   Zoster Recombinat (Shingrix) 10/30/2017, 01/27/2018  Past Medical History:  Diagnosis Date   Anemia    PAST HX- ON IRON    Arthritis    neck   Cataract    BEGINNING    COVID 02/2021   COVID-19 03/03/2021   COVID-19 virus infection 05/2019   Diverticulosis of colon    MILD   Elevated cholesterol with elevated triglycerides    Eustachian tube dysfunction, left 05/22/2018   Family history of breast cancer    Family  history of cervical cancer    GERD (gastroesophageal reflux disease)    History of COVID-19 02/27/2021   History of rectocele    Insomnia    started ambien 5 ya.   Osteopenia    --left femur   Periodic heart flutter    SVD (spontaneous vaginal delivery)    x 2   Urinary incontinence    Allergies  Allergen Reactions   Amoxicillin Hives   Sulfa Antibiotics Rash    Childhood rxn   Past Surgical History:  Procedure Laterality Date   BLADDER SUSPENSION N/A 11/10/2013   Procedure: TRANSVAGINAL TAPE (TVT) PROCEDURE;  Surgeon: Jamey Reas de Berton Lan, MD;  Location: [facility];  Service: Gynecology;  Laterality: N/A;   COLONOSCOPY  2011   [place] NORMAL    CYSTOSCOPY N/A 11/10/2013   Procedure: CYSTOSCOPY;  Surgeon: Jamey Reas de Berton Lan, MD;  Location: [facility];  Service: Gynecology;  Laterality: N/A;   IRRIGATION AND DEBRIDEMENT SEBACEOUS CYST  03/2017   VAGINAL HYSTERECTOMY  11/11    Vag cuff, mild rectocele   WISDOM TOOTH EXTRACTION     Family History  Problem Relation Age of Onset   Arthritis Mother    Hypertension Mother    Hyperlipidemia Mother    Heart disease Mother 102       dec   Hypertension Father    Hyperlipidemia Father    Heart disease Father 66       smoked for 10 yrs., no muscle damage, 1 MI    Pulmonary fibrosis Father        diagnosed end stage, cause unkn.   Urolithiasis Father    Diverticulosis Sister    Breast cancer Sister 69   Heart disease Brother    Hyperlipidemia Brother    Obesity Brother    Heart attack Brother 66       Passed away at 28 of heart attack   Hyperlipidemia Brother    Hypertension Brother    Kidney Stones Brother    Anti-cardiolipin syndrome Brother    Arthritis Maternal Grandmother    Cervical cancer Maternal Grandmother        diagnosed in her 34s or 83s   Arthritis Paternal Grandmother    Diabetes Son    Asthma Son    Seizures Son    Asthma Son    Colon cancer Neg Hx    Colon polyps Neg Hx     Esophageal cancer Neg Hx    Rectal cancer Neg Hx    Stomach cancer Neg Hx    Social History   Social History Narrative   Relocated to [place] from [place]. Due to husband job transfer. 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 She lives with her husband. She has 2 grown sons- oldest recently graduated from college and took a job in [place].; youngest just graduated HS & ia attending [place].  Has had regular preventive care in [place]. At [facility] w/Dr. Alfonse Spruce. Only health concern is insomnia that developed about 5 years ago & has been treated with [place]. She has not practiced sleep hygiene.      Occasionally drinks sweet tea.     Allergies as of 05/02/2021       Reactions   Amoxicillin Hives   Sulfa Antibiotics Rash   Childhood rxn        Medication List        Accurate as of May 02, 2021  1:04 PM. If you have any questions, ask your nurse or doctor.          atorvastatin 40 MG tablet Commonly known as: LIPITOR Take 1 tablet (40 mg total) by mouth daily.   baclofen 10 MG tablet Commonly known as: LIORESAL Take 1 tablet (10 mg total) by mouth 2 (two) times daily.   Caltrate 600+D Plus Minerals 600-800 MG-UNIT Tabs Take by mouth.   cyclobenzaprine 10 MG tablet Commonly known as: FLEXERIL Take 1 tablet (10 mg total) by mouth 3 (three) times daily as needed for muscle spasms.   fenofibrate 145 MG tablet Commonly known as: TRICOR Take 1 tablet (145 mg total) by mouth daily.   Fusion Plus Caps Take 1 capsule by mouth daily.   Omega 3 1200 MG Caps Take 2 capsules by mouth.   pantoprazole 20 MG tablet Commonly known as: PROTONIX Take 1 tablet (20 mg total) by mouth daily.   spironolactone 50 MG tablet Commonly known as: ALDACTONE Take 50 mg by mouth 2 (two) times daily.   tamoxifen 20 MG tablet Commonly known as: NOLVADEX Take 20 mg by mouth daily.   zolpidem 5 MG  tablet Commonly known as: AMBIEN Take 0.5-1 tablets (2.5-5 mg total) by mouth at bedtime as needed for sleep.        All past medical history, surgical history, allergies, family history, immunizations andmedications were updated in the EMR today and reviewed under the history and medication portions of their EMR.      ROS: 14 pt review of systems performed and negative (unless mentioned in an HPI)  Objective: BP 106/68   Pulse 74   Temp 98.1 F (36.7 C) (Oral)   Ht 5\' 3"  (1.6 m)   Wt 144 lb (65.3 kg)   LMP 07/30/2009 (Within Years)   SpO2 98%   BMI 25.51 kg/m  Gen: Afebrile. No acute distress.  Nontoxic, very pleasant female HENT: AT. [place].  Eyes:Pupils Equal Round Reactive to light, Extraocular movements intact,  Conjunctiva without redness, discharge or icterus. Neck/lymp/endocrine: Supple, no lymphadenopathy, no thyromegaly CV: RRR no murmur, no edema Chest: CTAB, no wheeze or crackles Skin: No rashes, purpura or petechiae.  Neuro:  Normal gait. PERLA. EOMi. Alert. Oriented x3 Psych: Normal affect, dress and demeanor. Normal speech. Normal thought content and judgment.  No results found.  Assessment/plan: [name] is a 60 y.o. female present for CPE/cmc Gastroesophageal reflux disease without esophagitis/long term PPI Stable Continue Protonix. She takes QOD if possible and QD if flares . Vit d, b12, mag up-to-date  Insomnia, unspecified type Stable -Continue ambien 2.5-5 mg QHS today.  - [place] reviewed 05/02/21 - Contract signed.  - F/U 6 month  or  when needing refills.    Mixed hyperlipidemia/ On statin therapy/Family history of premature coronary artery disease/coronary calcium score 2.4/63rd percentile Patient is in this 63rd percentile for cardiac calcium scoring with a family history.  Last appointment  we increased her statin with an LDL goal less than 70 for her.  She is tolerating increased well. - She has made great dietary and exercise  changes.  -Continue Lipitor - continue fish oil 3600 -Continue fenofibrate -LFT and lipids collected today    Prediabetes - last A1c 6.1--> 5.8-->5.8>>5.7 - monitor yearly as long as < 5.9 and fbg normal   Vitamin D deficiency/Osteopenia - taking her daily supplement Bone density UTD through GYN Vit d UTD- yearly with cpe   Iron deficiency: Taking daily iron.  Iron UTD yearly w/ cpe   Influenza vaccine administered today  Return in about 6 months (around 11/02/2021) for CPE (30 min), CMC (30 min).    Orders Placed This Encounter  Procedures   Flu Vaccine QUAD 6+ mos PF IM (Fluarix Quad PF)   Hepatic function panel   Lipid panel   Meds ordered this encounter  Medications   baclofen (LIORESAL) 10 MG tablet    Sig: Take 1 tablet (10 mg total) by mouth 2 (two) times daily.    Dispense:  180 each    Refill:  1   pantoprazole (PROTONIX) 20 MG tablet    Sig: Take 1 tablet (20 mg total) by mouth daily.    Dispense:  90 tablet    Refill:  3   fenofibrate (TRICOR) 145 MG tablet    Sig: Take 1 tablet (145 mg total) by mouth daily.    Dispense:  90 tablet    Refill:  3   zolpidem (AMBIEN) 5 MG tablet    Sig: Take 0.5-1 tablets (2.5-5 mg total) by mouth at bedtime as needed for sleep.    Dispense:  90 tablet    Refill:  1    Referral Orders  No referral(s) requested today     Electronically signed by: Howard Pouch, [place]

## 2021-05-03 ENCOUNTER — Telehealth: Payer: Self-pay | Admitting: Oncology

## 2021-05-03 NOTE — Telephone Encounter (Signed)
Scheduled appt per 10/5 received call from pt

## 2021-05-08 ENCOUNTER — Other Ambulatory Visit: Payer: Self-pay | Admitting: Obstetrics and Gynecology

## 2021-05-08 DIAGNOSIS — Z1231 Encounter for screening mammogram for malignant neoplasm of breast: Secondary | ICD-10-CM

## 2021-05-30 NOTE — Progress Notes (Signed)
[name]  Telephone:([phone] Fax:(336) [phone]     ID: [name] DOB: 06/22/1961  MR#: [id number]  GEX#:[id number]  Patient Care Team: Ma Hillock, DO as PCP - General (Family Medicine) Nunzio Cobbs, MD as Consulting Physician (Obstetrics and Gynecology) Clint Guy (Inactive) as Counselor (Genetic Counselor) Mauri Pole, MD as Consulting Physician (Gastroenterology) Chauncey Cruel, MD OTHER MD:  CHIEF COMPLAINT: Breast cancer high risk  CURRENT TREATMENT: Tamoxifen   INTERVAL HISTORY: Janice "[name]" returns today for follow up of her high risk for breast cancer. She was evaluated in the high risk clinic on 05/19/2020  She was started on prophylactic tamoxifen at consultation.  She is tolerating this remarkably well, with no significant issues regarding hot flashes or vaginal wetness.  She is obtaining it at a very good price  Since her last visit, she underwent bilateral screening mammography with tomography at [facility] on 08/03/2020 showing: breast density category B; no evidence of malignancy in either breast.  She also underwent bone density screening the same day showing a T-score of  -1.1, which is considered osteopenic.  She is scheduled for repeat mammogram on 08/07/2021.   REVIEW OF SYSTEMS: [name] has signed up for the No[employer] diet app and she tells me she has lost about 30 pounds just by writing down everything she eats and figuring out the calories.  She is also exercising more regularly.  Generally she feels "terrific".  A detailed review of systems today was otherwise stable.   COVID 19 VACCINATION STATUS: [name] x3, last 05/2020; infection 02/2021   HISTORY OF CURRENT ILLNESS: From the original intake note:  [name] has a high risk for breast cancer. Her sister was diagnosed with triple negative breast cancer in 2020 at age 44. [name] met with our genetic counselor on 05/12/2019  but opted not to pursue testing at that time. She instead opted to wait for her sister to undergo genetic testing first. Her sisters results were negative, thus additional testing was not pursued.  When the patient met with Dr. Quincy Simmonds most recently in September 2021 a TC score was calculated predicting a 25% lifetime risk of breast cancer.  Dr. Quincy Simmonds has discussed mammogram, breast self-awareness, bone and exercise guidelines, and the possibility of MRIs for breast cancer surveillance.  She was referred here for further discussion.  The patient's subsequent history is as detailed below.   PAST MEDICAL HISTORY: Past Medical History:  Diagnosis Date   Anemia    PAST HX- ON IRON    Arthritis    neck   Cataract    BEGINNING    COVID 02/2021   COVID-19 03/03/2021   COVID-19 virus infection 05/2019   Diverticulosis of colon    MILD   Elevated cholesterol with elevated triglycerides    Eustachian tube dysfunction, left 05/22/2018   Family history of breast cancer    Family history of cervical cancer    GERD (gastroesophageal reflux disease)    History of COVID-19 02/27/2021   History of rectocele    Insomnia    started ambien 5 ya.   Osteopenia    --left femur   Periodic heart flutter    SVD (spontaneous vaginal delivery)    x 2   Urinary incontinence     PAST SURGICAL HISTORY: Past Surgical History:  Procedure Laterality Date   BLADDER SUSPENSION N/A 11/10/2013   Procedure: TRANSVAGINAL TAPE (TVT) PROCEDURE;  Surgeon: Jamey Reas  de Berton Lan, MD;  Location: [facility];  Service: Gynecology;  Laterality: N/A;   COLONOSCOPY  2011   [place] NORMAL    CYSTOSCOPY N/A 11/10/2013   Procedure: CYSTOSCOPY;  Surgeon: Jamey Reas de Berton Lan, MD;  Location: [facility];  Service: Gynecology;  Laterality: N/A;   IRRIGATION AND DEBRIDEMENT SEBACEOUS CYST  03/2017   VAGINAL HYSTERECTOMY  11/11    Vag cuff, mild rectocele   WISDOM TOOTH EXTRACTION      FAMILY  HISTORY: Family History  Problem Relation Age of Onset   Arthritis Mother    Hypertension Mother    Hyperlipidemia Mother    Heart disease Mother 59       dec   Hypertension Father    Hyperlipidemia Father    Heart disease Father 89       smoked for 10 yrs., no muscle damage, 1 MI    Pulmonary fibrosis Father        diagnosed end stage, cause unkn.   Urolithiasis Father    Diverticulosis Sister    Breast cancer Sister 38   Heart disease Brother    Hyperlipidemia Brother    Obesity Brother    Heart attack Brother 66       Passed away at 76 of heart attack   Hyperlipidemia Brother    Hypertension Brother    Kidney Stones Brother    Anti-cardiolipin syndrome Brother    Arthritis Maternal Grandmother    Cervical cancer Maternal Grandmother        diagnosed in her 42s or 70s   Arthritis Paternal Grandmother    Diabetes Son    Asthma Son    Seizures Son    Asthma Son    Colon cancer Neg Hx    Colon polyps Neg Hx    Esophageal cancer Neg Hx    Rectal cancer Neg Hx    Stomach cancer Neg Hx   From the genetics assessment note 05/12/2019:   Ms. Filosa has two sons, one who is 60 and one who is 12. She has two brothers who are 75 and 75, and a sister who is 15 and was recently diagnosed with triple negative breast cancer. This sister has not yet had genetic testing for her cancer, although she is interested. Of note, the sister has also had a hysterectomy and had her ovaries removed at age 40.    Ms. Antunes's mother died at the age of 30 and did not have cancer. Her mother did have a hysterectomy at age 72 due to fibroids. Ms. Dapolito has one maternal aunt who is 90, and two cousins who have not had cancer. Her maternal grandmother died at age 63 and had cervical cancer in her 60s or 51s. Ms. Mansouri maternal grandfather died at age 37 and did not have cancer.   Ms. Brys father died at age 29 and did not have cancer. She did not have any paternal aunts or uncles. Her paternal  grandmother died at age 52, and her grandfather died at age 78. Both of these individuals had Alzheimers, but neither had cancer.    Ms. Buck is unaware of previous family history of genetic testing for hereditary cancer risks. There is no reported Ashkenazi Jewish ancestry. There is no known consanguinity.    GYNECOLOGIC HISTORY:  Patient's last menstrual period was 07/30/2009 (within years). Menarche: 60 years old Age at first live birth: 60 years old GX P 2 LMP 48 Contraceptive: Used oral contraceptives  for many years with no complications HRT no  Hysterectomy? Yes, 2011 BSO?  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  Her husband Phillip Heal works as a Occupational hygienist for a [employer] in [place].  Son Thedore Mins lives in [place] and works as a Government social research officer son Richardson Landry lives in [place] and works for [employer].  Both sons got engaged in the fall 2021 and they plan to be married in October and November 2023.  The patient has no grandchildren.  She is a Tourist information centre manager    ADVANCED DIRECTIVES: In the absence of any documentation to the contrary, the patient's spouse is their HCPOA.    HEALTH MAINTENANCE: Social History   Tobacco Use   Smoking status: Never   Smokeless tobacco: Never  Vaping Use   Vaping Use: Never used  Substance Use Topics   Alcohol use: Yes    Alcohol/week: 0.0 standard drinks    Comment: 1 glass of wine/month   Drug use: No     Colonoscopy: 11/2019, repeat due 2031  PAP: 2013, negative  Bone density: 07/2020, -1.1   Allergies  Allergen Reactions   Amoxicillin Hives   Sulfa Antibiotics Rash    Childhood rxn    Current Outpatient Medications  Medication Sig Dispense Refill   atorvastatin (LIPITOR) 40 MG tablet Take 1 tablet (40 mg total) by mouth daily. 90 tablet 3   baclofen (LIORESAL) 10 MG tablet Take 1 tablet (10 mg total) by mouth 2 (two) times daily. 180 each 1   Calcium  Carbonate-Vit D-Min (CALTRATE 600+D PLUS MINERALS) 600-800 MG-UNIT TABS Take by mouth.     cyclobenzaprine (FLEXERIL) 10 MG tablet Take 1 tablet (10 mg total) by mouth 3 (three) times daily as needed for muscle spasms. 30 tablet 1   fenofibrate (TRICOR) 145 MG tablet Take 1 tablet (145 mg total) by mouth daily. 90 tablet 3   Iron-FA-B Cmp-C-Biot-Probiotic (FUSION PLUS) CAPS Take 1 capsule by mouth daily.     Omega 3 1200 MG CAPS Take 2 capsules by mouth.     pantoprazole (PROTONIX) 20 MG tablet Take 1 tablet (20 mg total) by mouth daily. 90 tablet 3   spironolactone (ALDACTONE) 50 MG tablet Take 50 mg by mouth 2 (two) times daily.     tamoxifen (NOLVADEX) 20 MG tablet Take 1 tablet (20 mg total) by mouth daily. 90 tablet 6   zolpidem (AMBIEN) 5 MG tablet Take 0.5-1 tablets (2.5-5 mg total) by mouth at bedtime as needed for sleep. 90 tablet 1   No current facility-administered medications for this visit.    OBJECTIVE: [place] woman who appears with Vitals:   05/31/21 1021  BP: 103/63  Pulse: 83  Resp: 16  Temp: 97.6 F (36.4 C)  SpO2: 98%      Body mass index is 25.07 kg/m.   Wt Readings from Last 3 Encounters:  05/31/21 141 lb 8 oz (64.2 kg)  05/02/21 144 lb (65.3 kg)  04/26/21 145 lb (65.8 kg)      ECOG FS:1 - Symptomatic but completely ambulatory  Sclerae unicteric, EOMs intact Wearing a mask No cervical or supraclavicular adenopathy Lungs no rales or rhonchi Heart regular rate and rhythm Abd soft, nontender, positive bowel sounds MSK no focal spinal tenderness, no upper extremity lymphedema Neuro: nonfocal, well oriented, appropriate affect Breasts: There are no suspicious masses and no skin or nipple changes of concern.  Both axillae are benign.  LAB RESULTS:  CMP     Component Value Date/Time   NA 140 02/02/2021 0816   K 4.3 02/02/2021 0816   CL 107 02/02/2021 0816   CO2 24 02/02/2021 0816   GLUCOSE 94 02/02/2021 0816   BUN 22 02/02/2021 0816   CREATININE  0.97 02/02/2021 0816   CREATININE 1.12 (H) 05/19/2020 1521   CREATININE 0.86 01/26/2013 0812   CALCIUM 9.7 02/02/2021 0816   PROT 6.4 05/02/2021 0830   ALBUMIN 4.4 05/02/2021 0830   AST 16 05/02/2021 0830   AST 17 05/19/2020 1521   ALT 15 05/02/2021 0830   ALT 23 05/19/2020 1521   ALKPHOS 31 (L) 05/02/2021 0830   BILITOT 0.4 05/02/2021 0830   BILITOT 0.4 05/19/2020 1521   GFRNONAA 56 (L) 05/19/2020 1521   GFRAA >90 11/11/2013 0530    No results found for: TOTALPROTELP, ALBUMINELP, A1GS, A2GS, BETS, BETA2SER, GAMS, MSPIKE, SPEI  Lab Results  Component Value Date   WBC 7.5 05/19/2020   NEUTROABS 4.8 05/19/2020   HGB 14.6 05/19/2020   HCT 43.4 05/19/2020   MCV 93.3 05/19/2020   PLT 294 05/19/2020    No results found for: LABCA2  No components found for: EHUDJS970  No results for input(s): INR in the last 168 hours.  No results found for: LABCA2  No results found for: YOV785  No results found for: YIF027  No results found for: XAJ287  No results found for: CA2729  No components found for: HGQUANT  No results found for: CEA1 / No results found for: CEA1   No results found for: AFPTUMOR  No results found for: CHROMOGRNA  No results found for: KPAFRELGTCHN, LAMBDASER, KAPLAMBRATIO (kappa/lambda light chains)  No results found for: HGBA, HGBA2QUANT, HGBFQUANT, HGBSQUAN (Hemoglobinopathy evaluation)   No results found for: LDH  Lab Results  Component Value Date   IRON 94 10/31/2020   TIBC 319 10/31/2020   IRONPCTSAT 29 10/31/2020   (Iron and TIBC)  Lab Results  Component Value Date   FERRITIN 259 10/31/2020    Urinalysis    Component Value Date/Time   COLORURINE YELLOW 11/10/2013 0615   APPEARANCEUR CLEAR 11/10/2013 0615   LABSPEC 1.025 11/10/2013 0615   PHURINE 6.0 11/10/2013 0615   GLUCOSEU NEGATIVE 11/10/2013 0615   HGBUR SMALL (A) 11/10/2013 0615   BILIRUBINUR n 08/29/2016 0834   KETONESUR NEGATIVE 11/10/2013 0615   PROTEINUR n  08/29/2016 0834   PROTEINUR NEGATIVE 11/10/2013 0615   UROBILINOGEN negative 08/29/2016 0834   UROBILINOGEN 0.2 11/10/2013 0615   NITRITE n 08/29/2016 0834   NITRITE NEGATIVE 11/10/2013 0615   LEUKOCYTESUR Negative 08/29/2016 0834    STUDIES: No results found.   ELIGIBLE FOR AVAILABLE RESEARCH PROTOCOL: no  ASSESSMENT: 60 y.o. [facility] woman with a high Tyler Cusick score predicting a lifetime breast cancer risk in the 25% range.  (1) risk reduction: Tamoxifen started October 2021  (2) opted against intensified screening   PLAN: [name] is tolerating tamoxifen well and the plan will be to continue that for 5 years.  By doing that she will contact her risk of developing breast cancer in half.  She is aware of the possible toxicities side effects and complications of this agent and she will let us know if any of those develop.  Otherwise she is already scheduled for mammography next year and she will return to see [place] December 2023 after her sons weddings late next year  Total encounter time 20 minutes.Sarajane Jews C. Suellen Durocher, MD  05/31/2021 1:09 PM Medical Oncology and Hematology [facility] [address], [place] [address] Tel. [phone]    Fax. [phone]   This document serves as a record of services personally performed by Lurline Del, MD. It was created on his behalf by Wilburn Mylar, a trained medical scribe. The creation of this record is based on the scribe's personal observations and the provider's statements to them.   I, Lurline Del MD, have reviewed the above documentation for accuracy and completeness, and I agree with the above.   *Total Encounter Time as defined by the Centers for Medicare and Medicaid Services includes, in addition to the face-to-face time of a patient visit (documented in the note above) non-face-to-face time: obtaining and reviewing outside history, ordering and reviewing medications, tests or procedures,  care coordination (communications with other health care professionals or caregivers) and documentation in the medical record.

## 2021-05-31 ENCOUNTER — Inpatient Hospital Stay: Payer: BC Managed Care – PPO | Attending: Oncology | Admitting: Oncology

## 2021-05-31 ENCOUNTER — Other Ambulatory Visit: Payer: Self-pay

## 2021-05-31 VITALS — BP 103/63 | HR 83 | Temp 97.6°F | Resp 16 | Ht 63.0 in | Wt 141.5 lb

## 2021-05-31 DIAGNOSIS — Z8379 Family history of other diseases of the digestive system: Secondary | ICD-10-CM | POA: Insufficient documentation

## 2021-05-31 DIAGNOSIS — Z8349 Family history of other endocrine, nutritional and metabolic diseases: Secondary | ICD-10-CM | POA: Insufficient documentation

## 2021-05-31 DIAGNOSIS — E611 Iron deficiency: Secondary | ICD-10-CM | POA: Diagnosis not present

## 2021-05-31 DIAGNOSIS — Z8049 Family history of malignant neoplasm of other genital organs: Secondary | ICD-10-CM | POA: Diagnosis not present

## 2021-05-31 DIAGNOSIS — Z8616 Personal history of COVID-19: Secondary | ICD-10-CM | POA: Diagnosis not present

## 2021-05-31 DIAGNOSIS — Z9189 Other specified personal risk factors, not elsewhere classified: Secondary | ICD-10-CM | POA: Diagnosis not present

## 2021-05-31 DIAGNOSIS — Z803 Family history of malignant neoplasm of breast: Secondary | ICD-10-CM

## 2021-05-31 DIAGNOSIS — Z836 Family history of other diseases of the respiratory system: Secondary | ICD-10-CM | POA: Diagnosis not present

## 2021-05-31 DIAGNOSIS — Z8249 Family history of ischemic heart disease and other diseases of the circulatory system: Secondary | ICD-10-CM | POA: Insufficient documentation

## 2021-05-31 DIAGNOSIS — Z833 Family history of diabetes mellitus: Secondary | ICD-10-CM | POA: Diagnosis not present

## 2021-05-31 DIAGNOSIS — Z880 Allergy status to penicillin: Secondary | ICD-10-CM | POA: Diagnosis not present

## 2021-05-31 DIAGNOSIS — G4700 Insomnia, unspecified: Secondary | ICD-10-CM | POA: Diagnosis not present

## 2021-05-31 DIAGNOSIS — Z841 Family history of disorders of kidney and ureter: Secondary | ICD-10-CM | POA: Diagnosis not present

## 2021-05-31 DIAGNOSIS — Z79899 Other long term (current) drug therapy: Secondary | ICD-10-CM | POA: Insufficient documentation

## 2021-05-31 DIAGNOSIS — Z882 Allergy status to sulfonamides status: Secondary | ICD-10-CM | POA: Diagnosis not present

## 2021-05-31 DIAGNOSIS — Z820 Family history of epilepsy and other diseases of the nervous system: Secondary | ICD-10-CM | POA: Insufficient documentation

## 2021-05-31 DIAGNOSIS — M8580 Other specified disorders of bone density and structure, unspecified site: Secondary | ICD-10-CM | POA: Insufficient documentation

## 2021-05-31 DIAGNOSIS — K219 Gastro-esophageal reflux disease without esophagitis: Secondary | ICD-10-CM | POA: Insufficient documentation

## 2021-05-31 DIAGNOSIS — Z8261 Family history of arthritis: Secondary | ICD-10-CM | POA: Insufficient documentation

## 2021-05-31 DIAGNOSIS — D509 Iron deficiency anemia, unspecified: Secondary | ICD-10-CM | POA: Diagnosis not present

## 2021-05-31 MED ORDER — TAMOXIFEN CITRATE 20 MG PO TABS: 20.0000 mg | ORAL_TABLET | Freq: Every day | ORAL | 6 refills | Status: DC

## 2021-08-07 ENCOUNTER — Ambulatory Visit
Admission: RE | Admit: 2021-08-07 | Discharge: 2021-08-07 | Disposition: A | Discharge status: Home / Self Care | Payer: BC Managed Care – PPO | Source: Ambulatory Visit

## 2021-08-07 DIAGNOSIS — Z1231 Encounter for screening mammogram for malignant neoplasm of breast: Secondary | ICD-10-CM

## 2021-09-04 DIAGNOSIS — L649 Androgenic alopecia, unspecified: Secondary | ICD-10-CM | POA: Diagnosis not present

## 2021-09-04 DIAGNOSIS — L638 Other alopecia areata: Secondary | ICD-10-CM | POA: Diagnosis not present

## 2021-11-07 ENCOUNTER — Ambulatory Visit (INDEPENDENT_AMBULATORY_CARE_PROVIDER_SITE_OTHER): Payer: BC Managed Care – PPO | Admitting: Family Medicine

## 2021-11-07 ENCOUNTER — Encounter: Payer: Self-pay | Admitting: Family Medicine

## 2021-11-07 VITALS — BP 112/66 | HR 82 | Temp 97.9°F | Ht 64.0 in | Wt 141.0 lb

## 2021-11-07 DIAGNOSIS — R7303 Prediabetes: Secondary | ICD-10-CM

## 2021-11-07 DIAGNOSIS — Z79899 Other long term (current) drug therapy: Secondary | ICD-10-CM

## 2021-11-07 DIAGNOSIS — E559 Vitamin D deficiency, unspecified: Secondary | ICD-10-CM

## 2021-11-07 DIAGNOSIS — K219 Gastro-esophageal reflux disease without esophagitis: Secondary | ICD-10-CM

## 2021-11-07 DIAGNOSIS — E611 Iron deficiency: Secondary | ICD-10-CM

## 2021-11-07 DIAGNOSIS — E782 Mixed hyperlipidemia: Secondary | ICD-10-CM

## 2021-11-07 DIAGNOSIS — Z8249 Family history of ischemic heart disease and other diseases of the circulatory system: Secondary | ICD-10-CM

## 2021-11-07 DIAGNOSIS — Z0001 Encounter for general adult medical examination with abnormal findings: Secondary | ICD-10-CM | POA: Diagnosis not present

## 2021-11-07 DIAGNOSIS — M8580 Other specified disorders of bone density and structure, unspecified site: Secondary | ICD-10-CM

## 2021-11-07 DIAGNOSIS — G4700 Insomnia, unspecified: Secondary | ICD-10-CM

## 2021-11-07 DIAGNOSIS — Z0000 Encounter for general adult medical examination without abnormal findings: Secondary | ICD-10-CM

## 2021-11-07 DIAGNOSIS — F5101 Primary insomnia: Secondary | ICD-10-CM | POA: Diagnosis not present

## 2021-11-07 DIAGNOSIS — G8929 Other chronic pain: Secondary | ICD-10-CM

## 2021-11-07 DIAGNOSIS — I700 Atherosclerosis of aorta: Secondary | ICD-10-CM

## 2021-11-07 DIAGNOSIS — M545 Low back pain, unspecified: Secondary | ICD-10-CM

## 2021-11-07 DIAGNOSIS — R931 Abnormal findings on diagnostic imaging of heart and coronary circulation: Secondary | ICD-10-CM

## 2021-11-07 LAB — CBC
HCT: 38 % (ref 36.0–46.0)
Hemoglobin: 12.7 g/dL (ref 12.0–15.0)
MCHC: 33.5 g/dL (ref 30.0–36.0)
MCV: 96.3 fl (ref 78.0–100.0)
Platelets: 282 10*3/uL (ref 150.0–400.0)
RBC: 3.95 Mil/uL (ref 3.87–5.11)
RDW: 13.4 % (ref 11.5–15.5)
WBC: 5.4 10*3/uL (ref 4.0–10.5)

## 2021-11-07 LAB — COMPREHENSIVE METABOLIC PANEL
ALT: 14 U/L (ref 0–35)
AST: 15 U/L (ref 0–37)
Albumin: 4.4 g/dL (ref 3.5–5.2)
Alkaline Phosphatase: 27 U/L — ABNORMAL LOW (ref 39–117)
BUN: 26 mg/dL — ABNORMAL HIGH (ref 6–23)
CO2: 28 mEq/L (ref 19–32)
Calcium: 9.6 mg/dL (ref 8.4–10.5)
Chloride: 105 mEq/L (ref 96–112)
Creatinine, Ser: 0.91 mg/dL (ref 0.40–1.20)
GFR: 67.65 mL/min (ref >60.00)
Glucose, Bld: 87 mg/dL (ref 70–99)
Potassium: 4.4 mEq/L (ref 3.5–5.1)
Sodium: 140 mEq/L (ref 135–145)
Total Bilirubin: 0.5 mg/dL (ref 0.2–1.2)
Total Protein: 6.3 g/dL (ref 6.0–8.3)

## 2021-11-07 LAB — LIPID PANEL
Cholesterol: 135 mg/dL (ref 0–200)
HDL: 46.6 mg/dL (ref >39.00)
LDL Cholesterol: 69 mg/dL (ref 0–99)
NonHDL: 88.8
Total CHOL/HDL Ratio: 3
Triglycerides: 101 mg/dL (ref 0.0–149.0)
VLDL: 20.2 mg/dL (ref 0.0–40.0)

## 2021-11-07 LAB — VITAMIN D 25 HYDROXY (VIT D DEFICIENCY, FRACTURES): VITD: 36.99 ng/mL (ref 30.00–100.00)

## 2021-11-07 LAB — HEMOGLOBIN A1C: Hgb A1c MFr Bld: 5.6 % (ref 4.6–6.5)

## 2021-11-07 LAB — TSH: TSH: 2.02 u[IU]/mL (ref 0.35–5.50)

## 2021-11-07 MED ORDER — BACLOFEN 10 MG PO TABS: 10.0000 mg | ORAL_TABLET | Freq: Two times a day (BID) | ORAL | 1 refills | Status: DC

## 2021-11-07 MED ORDER — PANTOPRAZOLE SODIUM 20 MG PO TBEC: 20.0000 mg | DELAYED_RELEASE_TABLET | Freq: Every day | ORAL | 3 refills | Status: DC

## 2021-11-07 MED ORDER — ZOLPIDEM TARTRATE 5 MG PO TABS: 2.5000 mg | ORAL_TABLET | Freq: Every evening | ORAL | 1 refills | Status: DC | PRN

## 2021-11-07 MED ORDER — FENOFIBRATE 145 MG PO TABS: 145.0000 mg | ORAL_TABLET | Freq: Every day | ORAL | 3 refills | Status: DC

## 2021-11-07 MED ORDER — ATORVASTATIN CALCIUM 40 MG PO TABS: 40.0000 mg | ORAL_TABLET | Freq: Every day | ORAL | 3 refills | Status: DC

## 2021-11-07 NOTE — Patient Instructions (Signed)

## 2021-11-07 NOTE — Progress Notes (Signed)
? ?This visit occurred during the SARS-CoV-2 public health emergency.  Safety protocols were in place, including screening questions prior to the visit, additional usage of staff PPE, and extensive cleaning of exam room while observing appropriate contact time as indicated for disinfecting solutions.  ? ? ?Patient ID: [name], female  DOB: 08-24-1960, 61 y.o.   MRN: [id number] ?Patient Care Team  ?  Relationship Specialty Notifications Start End  ?Ma Hillock, DO PCP - General Family Medicine  03/29/15   ?Nunzio Cobbs, MD Consulting Physician Obstetrics and Gynecology  10/29/19   ?Clint Guy (Inactive) Counselor Genetic Counselor  10/29/19   ?Mauri Pole, MD Consulting Physician Gastroenterology  04/13/20   ? ? ?Chief Complaint  ?Patient presents with  ? Annual Exam  ?  Pt is fasting  ? ? ?Subjective: ? ?[name] is a 61 y.o.  Female  present for CPE/CMC ?All past medical history, surgical history, allergies, family history, immunizations, medications and social history were updated in the electronic medical record today. ?All recent labs, ED visits and hospitalizations within the last year were reviewed. ? ?Health maintenance:  ?Colonoscopy: completed 12/03/2019, 10-year follow-up.  Dr. Silverio Decamp ?Mammogram: completed: 07/2021. Dr. Quincy Simmonds orders. [facility]. ?Cervical cancer screening: Hysterectomy.  Followed by Dr. Quincy Simmonds. ?Immunizations: tdap UTD 2014, Influenza UTD 2022 (encouraged yearly), Shingrix series completed. Covid series completed w/ booster. ?Infectious disease screening: HIV and hep C completed  ?DEXA: last completed 07/2020, result -1.1 osteopenic, follow up 5-10 . followed by Dr. Quincy Simmonds ?Assistive device: None ?Oxygen use: None ?Patient has a Dental home. ?Hospitalizations/ED visits: Reviewed ? ?Insomnia/neck pain ?Patient reports insomnia is well controlled on Ambien 2.5-5 mg daily at bedtime. She has been on this medication since ~2012.  She reports she routinely needs this medication- sometimes she takes a half tab.. She denies negative side effects. She reports she has continued to use the baclofen BID for neck pain and feels it has been helpful.   ?  ?Mixed hyperlipidemia/ On statin therapy/Family history of premature coronary artery disease/ ?Patient reports she is compliant with atorvastatin 40 mg daily, fenofibrate 145 mg daily and she is taking 3600 mg of omega-3 supplement daily. ?LDL goal less than 70. ?CT cardiac scoring 02/07/2021: ?FINDINGS: ?Coronary Calcium Score: ?Left main: 0 ?Left anterior descending artery: 0.9 ?Left circumflex artery: 1.5 ?Right coronary artery: 0 ?Total: 2.4 ?Percentile: 63rd ?Pericardium: Normal. ?Ascending Aorta: Normal caliber. ?Non-cardiac: See separate report from [facility]. ?IMPRESSION: ?Coronary calcium score of 2.4. This was 63rd percentile for age-, ?race-, and sex-matched controls. ?  ?Prediabetes ?She is exercising routinely and has been eating a healthier diet.  She is continue to slowly lose weight over time intentionally. ? ? ?  11/07/2021  ?  8:56 AM 05/02/2021  ?  8:11 AM 10/31/2020  ?  8:01 AM 05/04/2019  ?  8:05 AM 05/22/2018  ?  9:00 AM  ?Depression screen PHQ 2/9  ?Decreased Interest 0 0 0 0 0  ?Down, Depressed, Hopeless 0 0 0  0  ?PHQ - 2 Score 0 0 0 0 0  ?Altered sleeping  0     ?Tired, decreased energy  0     ?Change in appetite  0     ?Feeling bad or failure about yourself   0     ?Trouble concentrating  0     ?Moving slowly or fidgety/restless  0     ?Suicidal thoughts  0     ?  PHQ-9 Score  0     ? ? ?  05/02/2021  ?  8:11 AM  ?GAD 7 : Generalized Anxiety Score  ?Nervous, Anxious, on Edge 0  ?Control/stop worrying 0  ?Worry too much - different things 0  ?Trouble relaxing 0  ?Restless 0  ?Easily annoyed or irritable 0  ? ? ?Immunization History  ?Administered Date(s) Administered  ? Influenza Inj Mdck Quad Pf 04/18/2018  ? Influenza, Quadrivalent, Recombinant, Inj, Pf 04/30/2019  ?  Influenza,inj,Quad PF,6+ Mos 05/17/2017, 04/13/2020, 05/02/2021  ? Influenza-Unspecified 06/20/2015, 05/25/2016, 04/30/2019  ? PFIZER(Purple Top)SARS-COV-2 Vaccination 10/03/2019, 10/24/2019, 06/18/2020  ? PPD Test 03/16/2014  ? Pension scheme manager 80yr & up 06/12/2021  ? Tdap 04/03/2013  ? Zoster Recombinat (Shingrix) 10/30/2017, 01/27/2018  ? ? ? ?Past Medical History:  ?Diagnosis Date  ? Anemia   ? PAST HX- ON IRON   ? Arthritis   ? neck  ? Cataract   ? BEGINNING   ? COVID 02/2021  ? COVID-19 03/03/2021  ? COVID-19 virus infection 05/2019  ? Diverticulosis of colon   ? MILD  ? Elevated cholesterol with elevated triglycerides   ? Eustachian tube dysfunction, left 05/22/2018  ? Family history of breast cancer   ? Family history of cervical cancer   ? GERD (gastroesophageal reflux disease)   ? History of COVID-19 02/27/2021  ? History of rectocele   ? Insomnia   ? started ambien 5 ya.  ? Osteopenia   ? --left femur  ? Periodic heart flutter   ? SVD (spontaneous vaginal delivery)   ? x 2  ? Ulnar nerve compression 03/29/2015  ? Urinary incontinence   ? ?Allergies  ?Allergen Reactions  ? Amoxicillin Hives  ? Sulfa Antibiotics Rash  ?  Childhood rxn  ? ?Past Surgical History:  ?Procedure Laterality Date  ? BLADDER SUSPENSION N/A 11/10/2013  ? Procedure: TRANSVAGINAL TAPE (TVT) PROCEDURE;  Surgeon: BJamey Reasde CBerton Lan MD;  [facility];  Service: Gynecology;  Laterality: N/A;  ? COLONOSCOPY  2011  ? [place] NORMAL   ? CYSTOSCOPY N/A 11/10/2013  ? Procedure: CYSTOSCOPY;  Surgeon: BJamey Reasde CBerton Lan MD;  [facility];  Service: Gynecology;  Laterality: N/A;  ? IRRIGATION AND DEBRIDEMENT SEBACEOUS CYST  03/2017  ? VAGINAL HYSTERECTOMY  11/11  ?  Vag cuff, mild rectocele  ? WISDOM TOOTH EXTRACTION    ? ?Family History  ?Problem Relation Age of Onset  ? Arthritis Mother   ? Hypertension Mother   ? Hyperlipidemia Mother   ? Heart disease Mother 855 ?     dec   ? Hypertension Father   ? Hyperlipidemia Father   ? Heart disease Father 456 ?     smoked for 10 yrs., no muscle damage, 1 MI   ? Pulmonary fibrosis Father   ?     diagnosed end stage, cause unkn.  ? Urolithiasis Father   ? Diverticulosis Sister   ? Breast cancer Sister 65 ? Heart disease Brother   ? Hyperlipidemia Brother   ? Obesity Brother   ? Heart attack Brother 529 ?     Passed away at 558of heart attack  ? Hyperlipidemia Brother   ? Hypertension Brother   ? Kidney Stones Brother   ? Anti-cardiolipin syndrome Brother   ? Arthritis Maternal Grandmother   ? Cervical cancer Maternal Grandmother   ?     diagnosed in her 467s  or 81s  ? Arthritis Paternal Grandmother   ? Diabetes Son   ? Asthma Son   ? Seizures Son   ? Asthma Son   ? Colon cancer Neg Hx   ? Colon polyps Neg Hx   ? Esophageal cancer Neg Hx   ? Rectal cancer Neg Hx   ? Stomach cancer Neg Hx   ? ?Social History  ? ?Social History Narrative  ? [place] [facility] [place] Due to husband job transfer. 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 She lives with her husband. She has 2 grown sons- oldest recently graduated from college and took a job in [place].; youngest just graduated HS & ia attending [place].  ?   ? Has had regular preventive care in [place]. At [facility] w/Dr. Alfonse Spruce. Only health concern is insomnia that developed about 5 years ago & has been treated with [place]. She has not practiced sleep hygiene.  ?   ? Occasionally drinks sweet tea.   ? ? ?Allergies as of 11/07/2021   ? ?   Reactions  ? Amoxicillin Hives  ? Sulfa Antibiotics Rash  ? Childhood rxn  ? ?  ? ?  ?Medication List  ?  ? ?  ? Accurate as of November 07, 2021  9:43 AM. If you have any questions, ask your nurse or doctor.  ?  ?  ? ?  ? ?STOP taking these medications   ? ?GNP Iron 200 (65 Fe) MG Tabs ?Generic drug: Ferrous Sulfate Dried ?Stopped by: Howard Pouch, DO ?  ? ?  ? ?TAKE these medications    ? ?atorvastatin 40 MG tablet ?Commonly known as: LIPITOR ?Take 1 tablet (40 mg total) by mouth daily. ?  ?baclofen 10 MG tablet ?Commonly known as: LIORESAL ?Take 1 tablet (10 mg total) by mouth 2 (two)

## 2021-11-23 DIAGNOSIS — H2513 Age-related nuclear cataract, bilateral: Secondary | ICD-10-CM | POA: Diagnosis not present

## 2021-11-26 ENCOUNTER — Other Ambulatory Visit: Payer: Self-pay | Admitting: Obstetrics and Gynecology

## 2021-11-27 NOTE — Telephone Encounter (Signed)
For chronic vulvitis. ?Last AEX 04/26/22--scheduled for 05/02/22.  ? ?Per chart states Rx'd by historical provider in 07/2021.  ? ? ?

## 2021-11-30 ENCOUNTER — Encounter: Payer: Self-pay | Admitting: Obstetrics and Gynecology

## 2021-12-01 NOTE — Telephone Encounter (Signed)
Last Rx'd by you in 08/2020 for chronic vulvitis. Please advise. Thanks.  ?

## 2021-12-04 ENCOUNTER — Other Ambulatory Visit: Payer: Self-pay | Admitting: Obstetrics and Gynecology

## 2021-12-04 DIAGNOSIS — L661 Lichen planopilaris: Secondary | ICD-10-CM | POA: Diagnosis not present

## 2021-12-04 DIAGNOSIS — L821 Other seborrheic keratosis: Secondary | ICD-10-CM | POA: Diagnosis not present

## 2021-12-04 MED ORDER — TRIAMCINOLONE ACETONIDE 0.5 % EX OINT: TOPICAL_OINTMENT | CUTANEOUS | 1 refills | Status: DC

## 2021-12-04 NOTE — Progress Notes (Signed)
Refill of Triamcinolone ointment.  ?

## 2022-04-01 ENCOUNTER — Encounter: Payer: Self-pay | Admitting: Family Medicine

## 2022-04-02 ENCOUNTER — Other Ambulatory Visit: Payer: Self-pay | Admitting: Obstetrics and Gynecology

## 2022-04-03 NOTE — Telephone Encounter (Signed)
FYI  Please see below

## 2022-04-04 NOTE — Telephone Encounter (Signed)
Last annual exam 03/2021 Scheduled on 04/2021 Mammogram 08/07/21

## 2022-04-24 ENCOUNTER — Ambulatory Visit (INDEPENDENT_AMBULATORY_CARE_PROVIDER_SITE_OTHER): Payer: BC Managed Care – PPO | Admitting: Family Medicine

## 2022-04-24 VITALS — BP 108/66 | HR 66 | Temp 98.8°F | Ht 64.0 in | Wt 135.0 lb

## 2022-04-24 DIAGNOSIS — F4320 Adjustment disorder, unspecified: Secondary | ICD-10-CM

## 2022-04-24 DIAGNOSIS — Z23 Encounter for immunization: Secondary | ICD-10-CM

## 2022-04-24 DIAGNOSIS — Z79899 Other long term (current) drug therapy: Secondary | ICD-10-CM | POA: Diagnosis not present

## 2022-04-24 DIAGNOSIS — G8929 Other chronic pain: Secondary | ICD-10-CM

## 2022-04-24 DIAGNOSIS — E782 Mixed hyperlipidemia: Secondary | ICD-10-CM

## 2022-04-24 DIAGNOSIS — F5101 Primary insomnia: Secondary | ICD-10-CM | POA: Diagnosis not present

## 2022-04-24 DIAGNOSIS — M545 Low back pain, unspecified: Secondary | ICD-10-CM

## 2022-04-24 DIAGNOSIS — K219 Gastro-esophageal reflux disease without esophagitis: Secondary | ICD-10-CM

## 2022-04-24 DIAGNOSIS — G4700 Insomnia, unspecified: Secondary | ICD-10-CM

## 2022-04-24 DIAGNOSIS — I700 Atherosclerosis of aorta: Secondary | ICD-10-CM

## 2022-04-24 DIAGNOSIS — Z634 Disappearance and death of family member: Secondary | ICD-10-CM

## 2022-04-24 DIAGNOSIS — R931 Abnormal findings on diagnostic imaging of heart and coronary circulation: Secondary | ICD-10-CM

## 2022-04-24 LAB — COMPREHENSIVE METABOLIC PANEL
ALT: 14 U/L (ref 0–35)
AST: 16 U/L (ref 0–37)
Albumin: 4.5 g/dL (ref 3.5–5.2)
Alkaline Phosphatase: 31 U/L — ABNORMAL LOW (ref 39–117)
BUN: 24 mg/dL — ABNORMAL HIGH (ref 6–23)
CO2: 29 mEq/L (ref 19–32)
Calcium: 10.3 mg/dL (ref 8.4–10.5)
Chloride: 104 mEq/L (ref 96–112)
Creatinine, Ser: 1.04 mg/dL (ref 0.40–1.20)
GFR: 57.45 mL/min — ABNORMAL LOW (ref >60.00)
Glucose, Bld: 102 mg/dL — ABNORMAL HIGH (ref 70–99)
Potassium: 4.7 mEq/L (ref 3.5–5.1)
Sodium: 139 mEq/L (ref 135–145)
Total Bilirubin: 0.6 mg/dL (ref 0.2–1.2)
Total Protein: 6.9 g/dL (ref 6.0–8.3)

## 2022-04-24 LAB — LDL CHOLESTEROL, DIRECT: Direct LDL: 75 mg/dL

## 2022-04-24 LAB — HEMOGLOBIN A1C: Hgb A1c MFr Bld: 5.8 % (ref 4.6–6.5)

## 2022-04-24 MED ORDER — ZOLPIDEM TARTRATE 5 MG PO TABS
2.5000 mg | ORAL_TABLET | Freq: Every evening | ORAL | 1 refills | Status: DC | PRN
Start: 2022-04-24 — End: 2022-11-23

## 2022-04-24 MED ORDER — BACLOFEN 10 MG PO TABS: 10.0000 mg | ORAL_TABLET | Freq: Two times a day (BID) | ORAL | 1 refills | Status: DC

## 2022-04-24 NOTE — Progress Notes (Signed)
Patient ID: [name], female  DOB: [birth year], 61 y.o.   MRN: [id number] Patient Care Team    Relationship Specialty Notifications Start End  Ma Hillock, DO PCP - General Family Medicine  03/29/15   Nunzio Cobbs, MD Consulting Physician Obstetrics and Gynecology  12/04/82   Clint Guy (Inactive) Counselor Genetic Counselor  10/29/19   Mauri Pole, MD Consulting Physician Gastroenterology  04/13/20     Chief Complaint  Patient presents with   Insomnia    Cmc; pt is fasting    Subjective: [name] is a 61 y.o.  Female  present for [facility] All past medical history, surgical history, allergies, family history, immunizations, medications and social history were updated in the electronic medical record today. All recent labs, ED visits and hospitalizations within the last year were reviewed.  Insomnia/neck pain Patient reports insomnia is controlled on Ambien 2.5-5 mg daily at bedtime. She has been on this medication since ~2012. She reports she routinely needs this medication- sometimes she takes a half tab.. She denies negative side effects. She reports she has continued to use the baclofen BID for neck pain and feels it has been helpful.     Mixed hyperlipidemia/ On statin therapy/Family history of premature coronary artery disease/ Patient reports compliance with atorvastatin 40 mg daily, fenofibrate 145 mg daily and she is taking 3600 mg of omega-3 supplement daily. LDL goal less than 70. CT cardiac scoring 02/07/2021: FINDINGS: Coronary Calcium Score: Left main: 0 Left anterior descending artery: 0.9 Left circumflex artery: 1.5 Right coronary artery: 0 Total: 2.4 Percentile: 63rd Pericardium: Normal. Ascending Aorta: Normal caliber. Non-cardiac: See separate report from [facility]. IMPRESSION: Coronary calcium score of 2.4. This was 63rd percentile for age-, race-, and sex-matched controls.   Prediabetes She is  exercising routinely and has been eating a healthier diet.  She is continue to slowly lose weight over time intentionally.  She reports she is at her goal now with a BMI of 23.     04/24/2022    8:59 AM 11/07/2021    8:56 AM 05/02/2021    8:11 AM 10/31/2020    8:01 AM 05/04/2019    8:05 AM  Depression screen PHQ 2/9  Decreased Interest 1 0 0 0 0  Down, Depressed, Hopeless 0 0 0 0   PHQ - 2 Score 1 0 0 0 0  Altered sleeping 2  0    Tired, decreased energy 0  0    Change in appetite 0  0    Feeling bad or failure about yourself  0  0    Trouble concentrating 1  0    Moving slowly or fidgety/restless 0  0    Suicidal thoughts 0  0    PHQ-9 Score 4  0        04/24/2022    9:00 AM 05/02/2021    8:11 AM  GAD 7 : Generalized Anxiety Score  Nervous, Anxious, on Edge 1 0  Control/stop worrying 0 0  Worry too much - different things 0 0  Trouble relaxing 1 0  Restless 0 0  Easily annoyed or irritable 1 0  Afraid - awful might happen 0   Total GAD 7 Score 3     Immunization History  Administered Date(s) Administered   Influenza Inj Mdck Quad Pf 04/18/2018   Influenza, Quadrivalent, Recombinant, Inj, Pf 04/30/2019   Influenza,inj,Quad PF,6+ Mos 05/17/2017, 04/13/2020, 05/02/2021, 04/24/2022  Influenza-Unspecified 06/20/2015, 05/25/2016, 04/30/2019   PFIZER(Purple Top)SARS-COV-2 Vaccination 10/03/2019, 10/24/2019, 06/18/2020   PPD Test 03/16/2014   Pfizer Covid-19 Vaccine Bivalent Booster 88yr & up 06/12/2021   Tdap 04/03/2013   Zoster Recombinat (Shingrix) 10/30/2017, 01/27/2018     Past Medical History:  Diagnosis Date   Anemia    PAST HX- ON IRON    Arthritis    neck   Cataract    BEGINNING    COVID 02/2021   COVID-19 03/03/2021   COVID-19 virus infection 05/2019   Diverticulosis of colon    MILD   Elevated cholesterol with elevated triglycerides    Eustachian tube dysfunction, left 05/22/2018   Family history of breast cancer    Family history of cervical cancer     GERD (gastroesophageal reflux disease)    History of COVID-19 02/27/2021   History of rectocele    Insomnia    started ambien 5 ya.   Osteopenia    --left femur   Periodic heart flutter    SVD (spontaneous vaginal delivery)    x 2   Ulnar nerve compression 03/29/2015   Urinary incontinence    Allergies  Allergen Reactions   Amoxicillin Hives   Sulfa Antibiotics Rash    Childhood rxn   Past Surgical History:  Procedure Laterality Date   BLADDER SUSPENSION N/A 11/10/2013   Procedure: TRANSVAGINAL TAPE (TVT) PROCEDURE;  Surgeon: BJamey Reasde CBerton Lan MD;  [facility];  Service: Gynecology;  Laterality: N/A;   [facility]OSCOPY  2011   [facility]    CYSTOSCOPY N/A 11/10/2013   Procedure: CYSTOSCOPY;  Surgeon: BJamey Reasde CBerton Lan MD;  [facility];  Service: Gynecology;  Laterality: N/A;   IRRIGATION AND DEBRIDEMENT SEBACEOUS CYST  03/2017   VAGINAL HYSTERECTOMY  11/11    Vag cuff, mild rectocele   WISDOM TOOTH EXTRACTION     Family History  Problem Relation Age of Onset   Arthritis Mother    Hypertension Mother    Hyperlipidemia Mother    Heart disease Mother 84      dec   Hypertension Father    Hyperlipidemia Father    Heart disease Father 471      smoked for 10 yrs., no muscle damage, 1 MI    Pulmonary fibrosis Father        diagnosed end stage, cause unkn.   Urolithiasis Father    Diverticulosis Sister    Breast cancer Sister 671  Heart disease Brother    Hyperlipidemia Brother    Obesity Brother    Heart attack Brother 56      Passed away at 545of heart attack   Hyperlipidemia Brother    Hypertension Brother    Kidney Stones Brother    Anti-cardiolipin syndrome Brother    Arthritis Maternal Grandmother    Cervical cancer Maternal Grandmother        diagnosed in her [id number] 585s  Arthritis Paternal Grandmother    Diabetes Son    Asthma Son    Seizures Son    Asthma Son    Colon cancer Neg Hx    Colon polyps  Neg Hx    Esophageal cancer Neg Hx    Rectal cancer Neg Hx    Stomach cancer Neg Hx    Social History   Social History Narrative   [place]. 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 She lives with her husband. She has 2 grown sons- oldest recently graduated from college and took a job in [place].; youngest just graduated HS & ia attending [employer].      Has had regular preventive care in [place]. At [facility] w/Dr. Alfonse Spruce. Only health concern is insomnia that developed about 5 years ago & has been treated with [place]. She has not practiced sleep hygiene.      Occasionally drinks sweet tea.     Allergies as of 04/24/2022       Reactions   Amoxicillin Hives   Sulfa Antibiotics Rash   Childhood rxn        Medication List        Accurate as of April 24, 2022  9:34 AM. If you have any questions, ask your nurse or doctor.          atorvastatin 40 MG tablet Commonly known as: LIPITOR Take 1 tablet (40 mg total) by mouth daily.   baclofen 10 MG tablet Commonly known as: LIORESAL Take 1 tablet (10 mg total) by mouth 2 (two) times daily.   Caltrate 600+D Plus Minerals 600-800 MG-UNIT Tabs Take by mouth.   clobetasol 0.05 % topical foam Commonly known as: OLUX SMARTSIG:1 sparingly Topical Daily   cyclobenzaprine 10 MG tablet Commonly known as: FLEXERIL Take 1 tablet (10 mg total) by mouth 3 (three) times daily as needed for muscle spasms.   fenofibrate 145 MG tablet Commonly known as: TRICOR Take 1 tablet (145 mg total) by mouth daily.   finasteride 1 MG tablet Commonly known as: PROPECIA Take 1 mg by mouth daily.   Fusion Plus Caps Take 1 capsule by mouth daily.   Omega 3 1200 MG Caps Take 2 capsules by mouth.   pantoprazole 20 MG tablet Commonly known as: PROTONIX Take 1 tablet (20 mg total) by mouth daily.    spironolactone 50 MG tablet Commonly known as: ALDACTONE Take 50 mg by mouth 2 (two) times daily.   tamoxifen 20 MG tablet Commonly known as: NOLVADEX Take 1 tablet (20 mg total) by mouth daily.   triamcinolone ointment 0.5 % Commonly known as: KENALOG APPLY TO AFFECTED AREA IN A THIN LAYER TWICE DAILY FOR 2 WEEKS AS NEEDED FOR A FLARE OF IRRITATION.   zolpidem 5 MG tablet Commonly known as: AMBIEN Take 0.5-1 tablets (2.5-5 mg total) by mouth at bedtime as needed for sleep.        All past medical history, surgical history, allergies, family history, immunizations andmedications were updated in the EMR today and reviewed under the history and medication portions of their EMR.     No results found for this or any previous visit (from the past 2160 hour(s)).  MM 3D SCREEN BREAST BILATERAL  Result Date: 08/07/2021 CLINICAL DATA:  Screening. EXAM: DIGITAL SCREENING BILATERAL MAMMOGRAM WITH TOMOSYNTHESIS AND CAD TECHNIQUE: Bilateral screening digital craniocaudal and mediolateral oblique mammograms were obtained. Bilateral screening digital breast tomosynthesis was performed. The images were evaluated with computer-aided detection. COMPARISON:  Previous exam(s). ACR Breast Density Category c: The breast tissue is heterogeneously dense, which may obscure small masses. FINDINGS: There are no findings suspicious for malignancy. IMPRESSION: No mammographic evidence of malignancy. A result letter of this screening mammogram will be mailed directly to the patient. RECOMMENDATION: Screening mammogram in one year. (Code:SM-B-01Y) BI-RADS CATEGORY  1: Negative. Electronically Signed   By: Lovey Newcomer  M.D.   On: 08/07/2021 15:49     ROS 14 pt review of systems performed and negative (unless mentioned in an HPI)  Objective BP 108/66   Pulse 66   Temp 98.8 F (37.1 C) (Oral)   Ht '5\' 4"'$  (1.626 m)   Wt 135 lb (61.2 kg)   LMP 07/30/2009 (Within Years)   SpO2 96%   BMI 23.17 kg/m  Physical  Exam Vitals and nursing note reviewed.  Constitutional:      General: She is not in acute distress.    Appearance: Normal appearance. She is not ill-appearing, toxic-appearing or diaphoretic.  HENT:     Head: Normocephalic and atraumatic.  Eyes:     General: No scleral icterus.       Right eye: No discharge.        Left eye: No discharge.     Extraocular Movements: Extraocular movements intact.     Conjunctiva/sclera: Conjunctivae normal.     Pupils: Pupils are equal, round, and reactive to light.  Cardiovascular:     Rate and Rhythm: Normal rate and regular rhythm.  Pulmonary:     Effort: Pulmonary effort is normal. No respiratory distress.     Breath sounds: Normal breath sounds. No wheezing, rhonchi or rales.  Musculoskeletal:     Cervical back: Neck supple. No tenderness.  Lymphadenopathy:     Cervical: No cervical adenopathy.  Skin:    General: Skin is warm and dry.     Coloration: Skin is not jaundiced or pale.     Findings: No erythema or rash.  Neurological:     Mental Status: She is alert and oriented to person, place, and time. Mental status is at baseline.     Motor: No weakness.     Gait: Gait normal.  Psychiatric:        Mood and Affect: Mood normal.        Behavior: Behavior normal.        Thought Content: Thought content normal.        Judgment: Judgment normal.      No results found.  Assessment/plan: [name] is a 61 y.o. female present for CPE/CMC Gastroesophageal reflux disease without esophagitis/long term PPI Stable Continue Protonix. She takes QOD if possible and QD if flares . b12, mag up-to-date   Insomnia, unspecified type Stable Continue ambien 2.5-5 mg QHS today.  - [employer] reviewed today - Contract signed.  - F/U 6 month  or  when needing refills.    Mixed hyperlipidemia/ On statin therapy/Family history of premature coronary artery disease/coronary calcium score 2.4/63rd percentile/aortic atherosclerosis Patient is in  this 63rd percentile for cardiac calcium scoring with a family history.  LDL goal less than 70 - She has made great dietary and exercise changes.  -Continue Lipitor -Continue-continue fenofibrate CMP, LDL collected today    Prediabetes Has been well controlled with her exercise and dietary changes.  She continues to lose weight slowly and now is at her goal with a BMI of 23. - monitor yearly as long as < 5.9 and fbg normal 1C collected today   Vitamin D deficiency/Osteopenia - taking her daily supplement Bone density UTD through GYN Vitamin D up-to-date 09/2020   Iron deficiency: Taking daily iron.  Iron panel up-to-date 09/2020  Influenza vaccine administered today  Return in about 7 months (around 11/09/2022) for cpe (20 min), Routine chronic condition follow-up.  Orders Placed This Encounter  Procedures   Flu Vaccine QUAD 6+ mos PF  IM (Fluarix Quad PF)   Comprehensive metabolic panel   Hemoglobin A1c   Direct LDL   Meds ordered this encounter  Medications   zolpidem (AMBIEN) 5 MG tablet    Sig: Take 0.5-1 tablets (2.5-5 mg total) by mouth at bedtime as needed for sleep.    Dispense:  90 tablet    Refill:  1   Referral Orders  No referral(s) requested today    Electronically signed by: Howard Pouch, [place]

## 2022-04-24 NOTE — Patient Instructions (Addendum)
Return in about 7 months (around 11/09/2022) for cpe (20 min), Routine chronic condition follow-up.        Great to see you today.  I have refilled the medication(s) we provide.   If labs were collected, we will inform you of lab results once received either by echart message or telephone call.   - echart message- for normal results that have been seen by the patient already.   - telephone call: abnormal results or if patient has not viewed results in their echart.

## 2022-04-26 NOTE — Progress Notes (Deleted)
61 y.o. G2P2 Married Caucasian female here for annual exam.    PCP:     Patient's last menstrual period was 07/30/2009 (within years).           Sexually active: {yes no:314532}  The current method of family planning is status post hysterectomy.    Exercising: {yes no:314532}  {types:19826} Smoker:  {YES [id number]  Health Maintenance: Pap:  2013 neg History of abnormal Pap:  no MMG:  08-07-21 category c density birads 1:neg Colonoscopy:  5/21 f/u 41yr BMD:   08-03-20  Result  osteopenia of rt hip TDaP:  2014 Gardasil:   no HIV: donates blood Hep C: donates blood Screening Labs:  Hb today: ***, Urine today: ***   reports that she has never smoked. She has never used smokeless tobacco. She reports current alcohol use. She reports that she does not use drugs.  Past Medical History:  Diagnosis Date   Anemia    PAST HX- ON IRON    Arthritis    neck   Cataract    BEGINNING    COVID 02/2021   COVID-19 03/03/2021   COVID-19 virus infection 05/2019   Diverticulosis of colon    MILD   Elevated cholesterol with elevated triglycerides    Eustachian tube dysfunction, left 05/22/2018   Family history of breast cancer    Family history of cervical cancer    GERD (gastroesophageal reflux disease)    History of COVID-19 02/27/2021   History of rectocele    Insomnia    started ambien 5 ya.   Osteopenia    --left femur   Periodic heart flutter    SVD (spontaneous vaginal delivery)    x 2   Ulnar nerve compression 03/29/2015   Urinary incontinence     Past Surgical History:  Procedure Laterality Date   BLADDER SUSPENSION N/A 11/10/2013   Procedure: TRANSVAGINAL TAPE (TVT) PROCEDURE;  Surgeon: BJamey Reasde CBerton Lan MD;  [facility];  Service: Gynecology;  Laterality: N/A;   [facility]  2011   [facility]    CYSTOSCOPY N/A 11/10/2013   Procedure: CYSTOSCOPY;  Surgeon: BJamey Reasde CBerton Lan MD;  [facility];  Service: Gynecology;   Laterality: N/A;   IRRIGATION AND DEBRIDEMENT SEBACEOUS CYST  03/2017   VAGINAL HYSTERECTOMY  11/11    Vag cuff, mild rectocele   WISDOM TOOTH EXTRACTION      Current Outpatient Medications  Medication Sig Dispense Refill   atorvastatin (LIPITOR) 40 MG tablet Take 1 tablet (40 mg total) by mouth daily. 90 tablet 3   baclofen (LIORESAL) 10 MG tablet Take 1 tablet (10 mg total) by mouth 2 (two) times daily. 180 each 1   Calcium Carbonate-Vit D-Min (CALTRATE 600+D PLUS MINERALS) 600-800 MG-UNIT TABS Take by mouth.     clobetasol (OLUX) 0.05 % topical foam SMARTSIG:1 sparingly Topical Daily     cyclobenzaprine (FLEXERIL) 10 MG tablet Take 1 tablet (10 mg total) by mouth 3 (three) times daily as needed for muscle spasms. 30 tablet 1   fenofibrate (TRICOR) 145 MG tablet Take 1 tablet (145 mg total) by mouth daily. 90 tablet 3   finasteride (PROPECIA) 1 MG tablet Take 1 mg by mouth daily.     Iron-FA-B Cmp-C-Biot-Probiotic (FUSION PLUS) CAPS Take 1 capsule by mouth daily.     Omega 3 1200 MG CAPS Take 2 capsules by mouth.     pantoprazole (PROTONIX) 20 MG tablet Take 1 tablet (20 mg total)  by mouth daily. 90 tablet 3   spironolactone (ALDACTONE) 50 MG tablet Take 50 mg by mouth 2 (two) times daily.     tamoxifen (NOLVADEX) 20 MG tablet Take 1 tablet (20 mg total) by mouth daily. 90 tablet 6   triamcinolone ointment (KENALOG) 0.5 % APPLY TO AFFECTED AREA IN A THIN LAYER TWICE DAILY FOR 2 WEEKS AS NEEDED FOR A FLARE OF IRRITATION. 30 g 0   zolpidem (AMBIEN) 5 MG tablet Take 0.5-1 tablets (2.5-5 mg total) by mouth at bedtime as needed for sleep. 90 tablet 1   No current facility-administered medications for this visit.    Family History  Problem Relation Age of Onset   Arthritis Mother    Hypertension Mother    Hyperlipidemia Mother    Heart disease Mother 12       dec   Hypertension Father    Hyperlipidemia Father    Heart disease Father 17       smoked for 10 yrs., no muscle damage, 1 MI     Pulmonary fibrosis Father        diagnosed end stage, cause unkn.   Urolithiasis Father    Diverticulosis Sister    Breast cancer Sister 54   Heart disease Brother    Hyperlipidemia Brother    Obesity Brother    Heart attack Brother 67       Passed away at 36 of heart attack   Hyperlipidemia Brother    Hypertension Brother    Kidney Stones Brother    Anti-cardiolipin syndrome Brother    Arthritis Maternal Grandmother    Cervical cancer Maternal Grandmother        diagnosed in her 29s or 12s   Arthritis Paternal Grandmother    Diabetes Son    Asthma Son    Seizures Son    Asthma Son    Colon cancer Neg Hx    Colon polyps Neg Hx    Esophageal cancer Neg Hx    Rectal cancer Neg Hx    Stomach cancer Neg Hx     Review of Systems  Exam:   LMP 07/30/2009 (Within Years)     General appearance: alert, cooperative and appears stated age Head: normocephalic, without obvious abnormality, atraumatic Neck: no adenopathy, supple, symmetrical, trachea midline and thyroid normal to inspection and palpation Lungs: clear to auscultation bilaterally Breasts: normal appearance, no masses or tenderness, No nipple retraction or dimpling, No nipple discharge or bleeding, No axillary adenopathy Heart: regular rate and rhythm Abdomen: soft, non-tender; no masses, no organomegaly Extremities: extremities normal, atraumatic, no cyanosis or edema Skin: skin color, texture, turgor normal. No rashes or lesions Lymph nodes: cervical, supraclavicular, and axillary nodes normal. Neurologic: grossly normal  Pelvic: External genitalia:  no lesions              No abnormal inguinal nodes palpated.              Urethra:  normal appearing urethra with no masses, tenderness or lesions              Bartholins and Skenes: normal                 Vagina: normal appearing vagina with normal color and discharge, no lesions              Cervix: no lesions              Pap taken: {yes no:314532} Bimanual  Exam:  Uterus:  normal size,  contour, position, consistency, mobility, non-tender              Adnexa: no mass, fullness, tenderness              Rectal exam: {yes no:314532}.  Confirms.              Anus:  normal sphincter tone, no lesions  Chaperone was present for exam:  ***  Assessment:   Well woman visit with gynecologic exam.   Plan: Mammogram screening discussed. Self breast awareness reviewed. Pap and HR HPV as above. Guidelines for Calcium, Vitamin D, regular exercise program including cardiovascular and weight bearing exercise.   Follow up annually and prn.   Additional counseling given.  {yes [id number]. _______ minutes face to face time of which over 50% was spent in counseling.    After visit summary provided.

## 2022-05-02 ENCOUNTER — Ambulatory Visit: Payer: BC Managed Care – PPO | Admitting: Obstetrics and Gynecology

## 2022-05-02 ENCOUNTER — Ambulatory Visit: Payer: Self-pay | Admitting: Obstetrics and Gynecology

## 2022-05-03 DIAGNOSIS — L821 Other seborrheic keratosis: Secondary | ICD-10-CM | POA: Diagnosis not present

## 2022-05-03 DIAGNOSIS — D2262 Melanocytic nevi of left upper limb, including shoulder: Secondary | ICD-10-CM | POA: Diagnosis not present

## 2022-05-03 DIAGNOSIS — L720 Epidermal cyst: Secondary | ICD-10-CM | POA: Diagnosis not present

## 2022-05-03 DIAGNOSIS — D2261 Melanocytic nevi of right upper limb, including shoulder: Secondary | ICD-10-CM | POA: Diagnosis not present

## 2022-05-07 ENCOUNTER — Encounter: Payer: Self-pay | Admitting: Obstetrics and Gynecology

## 2022-05-07 ENCOUNTER — Ambulatory Visit (INDEPENDENT_AMBULATORY_CARE_PROVIDER_SITE_OTHER): Payer: BC Managed Care – PPO | Admitting: Obstetrics and Gynecology

## 2022-05-07 VITALS — BP 108/60 | HR 82 | Ht 63.0 in | Wt 136.0 lb

## 2022-05-07 DIAGNOSIS — Z01419 Encounter for gynecological examination (general) (routine) without abnormal findings: Secondary | ICD-10-CM | POA: Diagnosis not present

## 2022-05-07 MED ORDER — TRIAMCINOLONE ACETONIDE 0.5 % EX OINT: TOPICAL_OINTMENT | CUTANEOUS | 1 refills | Status: DC

## 2022-05-07 NOTE — Patient Instructions (Signed)

## 2022-05-07 NOTE — Progress Notes (Signed)
61 y.o. G2P2 Married Caucasian female here for annual exam.    Husband died suddenly in 02/15/2023.  Dealing with grief.  2 children are marrying this year.   Taking Ambien for sleep.   Doing her weight loss journey through [place].   Using Triamcinolone every other week to treat vulvar irritation.   Taking Tamoxifen for increased risk of breast cancer.   PCP:   Howard Pouch   Patient's last menstrual period was 07/30/2009 (within years).           Sexually active: No.  The current method of family planning is status post hysterectomy.    Exercising: Yes.    Home exercise routine includes walking 1 hrs per day. Smoker:  no  Health Maintenance: Pap:  2013 Neg  History of abnormal Pap:  no MMG:  08/07/21 density C Bi-rads 1 neg  Colonoscopy:  11/2019 normal;next 10 years BMD:  1-5/22  Result :Osteopenia of hip. TDaP: 04-03-13 Gardasil:   no HIV: donates blood Hep C: donates blood Screening Labs:  PCP.   reports that she has never smoked. She has never used smokeless tobacco. She reports current alcohol use. She reports that she does not use drugs.  Past Medical History:  Diagnosis Date   Anemia    PAST HX- ON IRON    Arthritis    neck   Cataract    BEGINNING    COVID 02/2021   COVID-19 03/03/2021   COVID-19 virus infection 05/2019   Diverticulosis of colon    MILD   Elevated cholesterol with elevated triglycerides    Eustachian tube dysfunction, left 05/22/2018   Family history of breast cancer    Family history of cervical cancer    GERD (gastroesophageal reflux disease)    History of COVID-19 02/27/2021   History of rectocele    Insomnia    started ambien 5 ya.   Osteopenia    --left femur   Periodic heart flutter    SVD (spontaneous vaginal delivery)    x 2   Ulnar nerve compression 03/29/2015   Urinary incontinence     Past Surgical History:  Procedure Laterality Date   BLADDER SUSPENSION N/A 11/10/2013   Procedure: TRANSVAGINAL TAPE (TVT) PROCEDURE;  Surgeon:  Jamey Reas de Berton Lan, MD;  Location: [facility];  Service: Gynecology;  Laterality: N/A;   COLONOSCOPY  2011   [place] NORMAL    CYSTOSCOPY N/A 11/10/2013   Procedure: CYSTOSCOPY;  Surgeon: Jamey Reas de Berton Lan, MD;  Location: [facility];  Service: Gynecology;  Laterality: N/A;   IRRIGATION AND DEBRIDEMENT SEBACEOUS CYST  03/2017   VAGINAL HYSTERECTOMY  11/11    Vag cuff, mild rectocele   WISDOM TOOTH EXTRACTION      Current Outpatient Medications  Medication Sig Dispense Refill   atorvastatin (LIPITOR) 40 MG tablet Take 1 tablet (40 mg total) by mouth daily. 90 tablet 3   baclofen (LIORESAL) 10 MG tablet Take 1 tablet (10 mg total) by mouth 2 (two) times daily. 180 each 1   Calcium Carbonate-Vit D-Min (CALTRATE 600+D PLUS MINERALS) 600-800 MG-UNIT TABS Take by mouth.     clobetasol (OLUX) 0.05 % topical foam SMARTSIG:1 sparingly Topical Daily     cyclobenzaprine (FLEXERIL) 10 MG tablet Take 1 tablet (10 mg total) by mouth 3 (three) times daily as needed for muscle spasms. 30 tablet 1   fenofibrate (TRICOR) 145 MG tablet Take 1 tablet (145 mg total) by mouth daily. 90 tablet 3  finasteride (PROPECIA) 1 MG tablet Take 1 mg by mouth daily.     Iron-FA-B Cmp-C-Biot-Probiotic (FUSION PLUS) CAPS Take 1 capsule by mouth daily.     Omega 3 1200 MG CAPS Take 2 capsules by mouth.     pantoprazole (PROTONIX) 20 MG tablet Take 1 tablet (20 mg total) by mouth daily. 90 tablet 3   spironolactone (ALDACTONE) 50 MG tablet Take 50 mg by mouth 2 (two) times daily.     tamoxifen (NOLVADEX) 20 MG tablet Take 1 tablet (20 mg total) by mouth daily. 90 tablet 6   zolpidem (AMBIEN) 5 MG tablet Take 0.5-1 tablets (2.5-5 mg total) by mouth at bedtime as needed for sleep. 90 tablet 1   triamcinolone ointment (KENALOG) 0.5 % APPLY TO AFFECTED AREA IN A THIN LAYER TWICE DAILY FOR 2 WEEKS AS NEEDED FOR A FLARE OF IRRITATION. 30 g 1   No current facility-administered medications for this  visit.    Family History  Problem Relation Age of Onset   Arthritis Mother    Hypertension Mother    Hyperlipidemia Mother    Heart disease Mother 77       dec   Hypertension Father    Hyperlipidemia Father    Heart disease Father 49       smoked for 10 yrs., no muscle damage, 1 MI    Pulmonary fibrosis Father        diagnosed end stage, cause unkn.   Urolithiasis Father    Diverticulosis Sister    Breast cancer Sister 51   Heart disease Brother    Hyperlipidemia Brother    Obesity Brother    Heart attack Brother 68       Passed away at 26 of heart attack   Hyperlipidemia Brother    Hypertension Brother    Kidney Stones Brother    Anti-cardiolipin syndrome Brother    Arthritis Maternal Grandmother    Cervical cancer Maternal Grandmother        diagnosed in her 25s or 43s   Arthritis Paternal Grandmother    Diabetes Son    Asthma Son    Seizures Son    Asthma Son    Colon cancer Neg Hx    Colon polyps Neg Hx    Esophageal cancer Neg Hx    Rectal cancer Neg Hx    Stomach cancer Neg Hx     Review of Systems  All other systems reviewed and are negative.   Exam:   BP 100/78 (BP Location: Left Arm, Patient Position: Sitting, Cuff Size: Normal)   Pulse 82   Ht '5\' 3"'$  (1.6 m)   Wt 136 lb (61.7 kg)   LMP 07/30/2009 (Within Years)   BMI 24.09 kg/m     General appearance: alert, cooperative and appears stated age Head: normocephalic, without obvious abnormality, atraumatic Neck: no adenopathy, supple, symmetrical, trachea midline and thyroid normal to inspection and palpation Lungs: clear to auscultation bilaterally Breasts: normal appearance, no masses or tenderness, No nipple retraction or dimpling, No nipple discharge or bleeding, No axillary adenopathy Heart: regular rate and rhythm Abdomen: soft, non-tender; no masses, no organomegaly Extremities: extremities normal, atraumatic, no cyanosis or edema Skin: skin color, texture, turgor normal. No rashes or  lesions Lymph nodes: cervical, supraclavicular, and axillary nodes normal. Neurologic: grossly normal  Pelvic: External genitalia:  no lesions              No abnormal inguinal nodes palpated.  Urethra:  normal appearing urethra with no masses, tenderness or lesions              Bartholins and Skenes: normal                 Vagina: normal appearing vagina with normal color and discharge, no lesions              Cervix: absent              Pap taken: no Bimanual Exam:  Uterus:  absent.              Adnexa: no mass, fullness, tenderness              Rectal exam: yes.  Confirms.              Anus:  normal sphincter tone, no lesions  Chaperone was present for exam:  yes.   Assessment:   Well woman visit with gynecologic exam. Status post TVH.  Status post TVT.  Increased lifetime risk of breast cancer - 25% by TC model.  Sister tested  Negative, so genetic testing declined by patient.  On Tamoxifen.  Osteopenia. Very mild.  Vulvitis.  Bereavement.   Plan: Mammogram screening discussed. Self breast awareness reviewed. She will see her oncologist in December, 2023.  Pap and HR HPV as above. Guidelines for Calcium, Vitamin D, regular exercise program including cardiovascular and weight bearing exercise. BMD in 2025.  Refill of Triamcinolone ointment. Support given for the loss of her husband.    Follow up annually and prn.   After visit summary provided.

## 2022-05-28 ENCOUNTER — Other Ambulatory Visit: Payer: Self-pay | Admitting: Family Medicine

## 2022-05-28 DIAGNOSIS — G4700 Insomnia, unspecified: Secondary | ICD-10-CM

## 2022-06-06 DIAGNOSIS — L661 Lichen planopilaris: Secondary | ICD-10-CM | POA: Diagnosis not present

## 2022-06-11 ENCOUNTER — Other Ambulatory Visit: Payer: Self-pay | Admitting: Obstetrics and Gynecology

## 2022-06-11 DIAGNOSIS — Z1231 Encounter for screening mammogram for malignant neoplasm of breast: Secondary | ICD-10-CM

## 2022-06-29 ENCOUNTER — Inpatient Hospital Stay: Payer: BC Managed Care – PPO | Attending: Hematology and Oncology | Admitting: Hematology and Oncology

## 2022-06-29 ENCOUNTER — Encounter: Payer: Self-pay | Admitting: Hematology and Oncology

## 2022-06-29 VITALS — BP 112/92 | HR 94 | Temp 97.9°F | Resp 16 | Ht 63.0 in | Wt 134.6 lb

## 2022-06-29 DIAGNOSIS — Z9189 Other specified personal risk factors, not elsewhere classified: Secondary | ICD-10-CM | POA: Diagnosis not present

## 2022-06-29 DIAGNOSIS — Z841 Family history of disorders of kidney and ureter: Secondary | ICD-10-CM | POA: Insufficient documentation

## 2022-06-29 DIAGNOSIS — Z8261 Family history of arthritis: Secondary | ICD-10-CM | POA: Diagnosis not present

## 2022-06-29 DIAGNOSIS — Z8049 Family history of malignant neoplasm of other genital organs: Secondary | ICD-10-CM | POA: Diagnosis not present

## 2022-06-29 DIAGNOSIS — G4700 Insomnia, unspecified: Secondary | ICD-10-CM | POA: Insufficient documentation

## 2022-06-29 DIAGNOSIS — M8580 Other specified disorders of bone density and structure, unspecified site: Secondary | ICD-10-CM | POA: Diagnosis not present

## 2022-06-29 DIAGNOSIS — Z8249 Family history of ischemic heart disease and other diseases of the circulatory system: Secondary | ICD-10-CM | POA: Insufficient documentation

## 2022-06-29 DIAGNOSIS — Z880 Allergy status to penicillin: Secondary | ICD-10-CM | POA: Diagnosis not present

## 2022-06-29 DIAGNOSIS — Z8349 Family history of other endocrine, nutritional and metabolic diseases: Secondary | ICD-10-CM | POA: Diagnosis not present

## 2022-06-29 DIAGNOSIS — Z803 Family history of malignant neoplasm of breast: Secondary | ICD-10-CM | POA: Insufficient documentation

## 2022-06-29 DIAGNOSIS — Z882 Allergy status to sulfonamides status: Secondary | ICD-10-CM | POA: Diagnosis not present

## 2022-06-29 DIAGNOSIS — Z79899 Other long term (current) drug therapy: Secondary | ICD-10-CM | POA: Insufficient documentation

## 2022-06-29 DIAGNOSIS — Z8379 Family history of other diseases of the digestive system: Secondary | ICD-10-CM | POA: Diagnosis not present

## 2022-06-29 DIAGNOSIS — Z8616 Personal history of COVID-19: Secondary | ICD-10-CM | POA: Diagnosis not present

## 2022-06-29 DIAGNOSIS — Z79810 Long term (current) use of selective estrogen receptor modulators (SERMs): Secondary | ICD-10-CM | POA: Diagnosis not present

## 2022-06-29 DIAGNOSIS — Z833 Family history of diabetes mellitus: Secondary | ICD-10-CM | POA: Diagnosis not present

## 2022-06-29 DIAGNOSIS — K219 Gastro-esophageal reflux disease without esophagitis: Secondary | ICD-10-CM | POA: Insufficient documentation

## 2022-06-29 DIAGNOSIS — E782 Mixed hyperlipidemia: Secondary | ICD-10-CM | POA: Insufficient documentation

## 2022-06-29 MED ORDER — TAMOXIFEN CITRATE 20 MG PO TABS: 20.0000 mg | ORAL_TABLET | Freq: Every day | ORAL | 6 refills | Status: DC

## 2022-06-29 NOTE — Progress Notes (Signed)
[facility]  [phone] [phone]     ID: SHAKIMA NISLEY DOB: 05-18-1961  MR#: [id number]  UYZ#:[id number]  Patient Care Team: Ma Hillock, DO as PCP - General (Family Medicine) Nunzio Cobbs, MD as Consulting Physician (Obstetrics and Gynecology) Clint Guy (Inactive) as Counselor (Genetic Counselor) Mauri Pole, MD as Consulting Physician (Gastroenterology) Benay Pike, MD  CHIEF COMPLAINT: Breast cancer high risk  CURRENT TREATMENT: Tamoxifen   INTERVAL HISTORY:  [name] "[name]" returns today for follow up of her high risk for breast cancer.  Since last visit here, she lost her husband suddenly.  He was only 61 and was not feeling well and suddenly passed away.  He was not given sick prior to this.  She is going through a lot with loss of her husband and her brother.  She has 2 sons who live in [place] and [place].  She has been compliant with her tamoxifen.  She denies any adverse effects with it. She has her mammogram scheduled for January.  She has been doing self breast exam and denies any breast changes. Rest of the pertinent 10 point ROS reviewed and negative COVID 19 VACCINATION STATUS: [place] x3, last 05/2020; infection 02/2021   HISTORY OF CURRENT ILLNESS: From the original intake note:  [name] "[name]" has a high risk for breast cancer. Her sister was diagnosed with triple negative breast cancer in 2020 at age 45. [name] met with our genetic counselor on 05/12/2019 but opted not to pursue testing at that time. She instead opted to wait for her sister to undergo genetic testing first. Her sisters results were negative, thus additional testing was not pursued.  When the patient met with Dr. Quincy Simmonds most recently in September 2021 a TC score was calculated predicting a 25% lifetime risk of breast cancer.  Dr. Quincy Simmonds has discussed mammogram, breast self-awareness, bone and exercise guidelines, and  the possibility of MRIs for breast cancer surveillance.  She was referred here for further discussion.  The patient's subsequent history is as detailed below.   PAST MEDICAL HISTORY: Past Medical History:  Diagnosis Date   Anemia    PAST HX- ON IRON    Arthritis    neck   Cataract    BEGINNING    COVID 02/2021   COVID-19 03/03/2021   COVID-19 virus infection 05/2019   Diverticulosis of colon    MILD   Elevated cholesterol with elevated triglycerides    Eustachian tube dysfunction, left 05/22/2018   Family history of breast cancer    Family history of cervical cancer    GERD (gastroesophageal reflux disease)    History of COVID-19 02/27/2021   History of rectocele    Insomnia    started ambien 5 ya.   Osteopenia    --left femur   Periodic heart flutter    SVD (spontaneous vaginal delivery)    x 2   Ulnar nerve compression 03/29/2015   Urinary incontinence     PAST SURGICAL HISTORY: Past Surgical History:  Procedure Laterality Date   BLADDER SUSPENSION N/A 11/10/2013   Procedure: TRANSVAGINAL TAPE (TVT) PROCEDURE;  Surgeon: Jamey Reas de Berton Lan, MD;  Location: [facility];  Service: Gynecology;  Laterality: N/A;   COLONOSCOPY  2011   [place] NORMAL    CYSTOSCOPY N/A 11/10/2013   Procedure: CYSTOSCOPY;  Surgeon: Jamey Reas de Berton Lan, MD;  Location: [facility];  Service: Gynecology;  Laterality: N/A;  IRRIGATION AND DEBRIDEMENT SEBACEOUS CYST  03/2017   VAGINAL HYSTERECTOMY  11/11    Vag cuff, mild rectocele   WISDOM TOOTH EXTRACTION      FAMILY HISTORY: Family History  Problem Relation Age of Onset   Arthritis Mother    Hypertension Mother    Hyperlipidemia Mother    Heart disease Mother 28       dec   Hypertension Father    Hyperlipidemia Father    Heart disease Father 58       smoked for 10 yrs., no muscle damage, 1 MI    Pulmonary fibrosis Father        diagnosed end stage, cause unkn.   Urolithiasis Father    Diverticulosis  Sister    Breast cancer Sister 50   Heart disease Brother    Hyperlipidemia Brother    Obesity Brother    Heart attack Brother 23       Passed away at 31 of heart attack   Hyperlipidemia Brother    Hypertension Brother    Kidney Stones Brother    Anti-cardiolipin syndrome Brother    Arthritis Maternal Grandmother    Cervical cancer Maternal Grandmother        diagnosed in her 42s or 43s   Arthritis Paternal Grandmother    Diabetes Son    Asthma Son    Seizures Son    Asthma Son    Colon cancer Neg Hx    Colon polyps Neg Hx    Esophageal cancer Neg Hx    Rectal cancer Neg Hx    Stomach cancer Neg Hx   From the genetics assessment note 05/12/2019:   [name] has two sons, one who is 58 and one who is 61. She has two brothers who are 77 and 33, and a sister who is 42 and was recently diagnosed with triple negative breast cancer. This sister has not yet had genetic testing for her cancer, although she is interested. Of note, the sister has also had a hysterectomy and had her ovaries removed at age 38.    [name]'s mother died at the age of 49 and did not have cancer. Her mother did have a hysterectomy at age 55 due to fibroids. [name] has one maternal aunt who is 51, and two cousins who have not had cancer. Her maternal grandmother died at age 60 and had cervical cancer in her 56s or 89s. [name] maternal grandfather died at age 67 and did not have cancer.   [name] father died at age 51 and did not have cancer. She did not have any paternal aunts or uncles. Her paternal grandmother died at age 63, and her grandfather died at age 53. Both of these individuals had Alzheimers, but neither had cancer.    [name] is unaware of previous family history of genetic testing for hereditary cancer risks. There is no reported Ashkenazi Jewish ancestry. There is no known consanguinity.    GYNECOLOGIC HISTORY:  Patient's last menstrual period was 07/30/2009 (within  years). Menarche: 61 years old Age at first live birth: 61 years old [place] P 2 LMP 48 Contraceptive: Used oral contraceptives for many years with no complications HRT no  Hysterectomy? Yes, 2011 BSO?  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  Her husband Phillip Heal works as a Occupational hygienist for a [employer] in [place].  Son Thedore Mins lives in [place] and  works as a Government social research officer son Environmental consultant lives in [place] and works for [employer].  Both sons got married this fall.   ADVANCED DIRECTIVES: In the absence of any documentation to the contrary, the patient's spouse is their HCPOA.    HEALTH MAINTENANCE: Social History   Tobacco Use   Smoking status: Never   Smokeless tobacco: Never  Vaping Use   Vaping Use: Never used  Substance Use Topics   Alcohol use: Yes    Alcohol/week: 0.0 standard drinks of alcohol    Comment: 1 glass of wine/month   Drug use: No     Colonoscopy: 11/2019, repeat due 2031  PAP: 2013, negative  Bone density: 07/2020, -1.1   Allergies  Allergen Reactions   Amoxicillin Hives   Sulfa Antibiotics Rash    Childhood rxn    Current Outpatient Medications  Medication Sig Dispense Refill   atorvastatin (LIPITOR) 40 MG tablet Take 1 tablet (40 mg total) by mouth daily. 90 tablet 3   baclofen (LIORESAL) 10 MG tablet Take 1 tablet (10 mg total) by mouth 2 (two) times daily. 180 each 1   Calcium Carbonate-Vit D-Min (CALTRATE 600+D PLUS MINERALS) 600-800 MG-UNIT TABS Take by mouth.     clobetasol (OLUX) 0.05 % topical foam SMARTSIG:1 sparingly Topical Daily     cyclobenzaprine (FLEXERIL) 10 MG tablet Take 1 tablet (10 mg total) by mouth 3 (three) times daily as needed for muscle spasms. 30 tablet 1   fenofibrate (TRICOR) 145 MG tablet Take 1 tablet (145 mg total) by mouth daily. 90 tablet 3   finasteride (PROPECIA) 1 MG tablet Take 1 mg by mouth daily.     Iron-FA-B Cmp-C-Biot-Probiotic (FUSION  PLUS) CAPS Take 1 capsule by mouth daily.     Omega 3 1200 MG CAPS Take 2 capsules by mouth.     pantoprazole (PROTONIX) 20 MG tablet Take 1 tablet (20 mg total) by mouth daily. 90 tablet 3   spironolactone (ALDACTONE) 50 MG tablet Take 50 mg by mouth 2 (two) times daily.     tamoxifen (NOLVADEX) 20 MG tablet Take 1 tablet (20 mg total) by mouth daily. 90 tablet 6   triamcinolone ointment (KENALOG) 0.5 % APPLY TO AFFECTED AREA IN A THIN LAYER TWICE DAILY FOR 2 WEEKS AS NEEDED FOR A FLARE OF IRRITATION. 30 g 1   zolpidem (AMBIEN) 5 MG tablet Take 0.5-1 tablets (2.5-5 mg total) by mouth at bedtime as needed for sleep. 90 tablet 1   No current facility-administered medications for this visit.    OBJECTIVE: [place] woman who appears with Vitals:   06/29/22 0940  BP: (!) 112/92  Pulse: 94  Resp: 16  Temp: 97.9 F (36.6 C)  SpO2: 100%      Body mass index is 23.84 kg/m.   Wt Readings from Last 3 Encounters:  06/29/22 134 lb 9.6 oz (61.1 kg)  05/07/22 136 lb (61.7 kg)  04/24/22 135 lb (61.2 kg)      ECOG FS:1 - Symptomatic but completely ambulatory  Physical Exam Constitutional:      Appearance: Normal appearance.  Chest:     Comments: Bilateral breasts inspected.  No palpable masses in both breast.  No regional adenopathy. Musculoskeletal:        General: No swelling.     Cervical back: Normal range of motion and neck supple. No rigidity.  Lymphadenopathy:     Cervical: No cervical adenopathy.  Neurological:     Mental Status: She is alert.  LAB RESULTS:  CMP     Component Value Date/Time   NA 139 04/24/2022 0929   K 4.7 04/24/2022 0929   CL 104 04/24/2022 0929   CO2 29 04/24/2022 0929   GLUCOSE 102 (H) 04/24/2022 0929   BUN 24 (H) 04/24/2022 0929   CREATININE 1.04 04/24/2022 0929   CREATININE 1.12 (H) 05/19/2020 1521   CREATININE 0.86 01/26/2013 0812   CALCIUM 10.3 04/24/2022 0929   PROT 6.9 04/24/2022 0929   ALBUMIN 4.5 04/24/2022 0929   AST 16  04/24/2022 0929   AST 17 05/19/2020 1521   ALT 14 04/24/2022 0929   ALT 23 05/19/2020 1521   ALKPHOS 31 (L) 04/24/2022 0929   BILITOT 0.6 04/24/2022 0929   BILITOT 0.4 05/19/2020 1521   GFRNONAA 56 (L) 05/19/2020 1521   GFRAA >90 11/11/2013 0530    No results found for: "TOTALPROTELP", "ALBUMINELP", "A1GS", "A2GS", "BETS", "BETA2SER", "GAMS", "MSPIKE", "SPEI"  Lab Results  Component Value Date   WBC 5.4 11/07/2021   NEUTROABS 4.8 05/19/2020   HGB 12.7 11/07/2021   HCT 38.0 11/07/2021   MCV 96.3 11/07/2021   PLT 282.0 11/07/2021    No results found for: "LABCA2"  No components found for: "KGURKY706"  No results for input(s): "INR" in the last 168 hours.  No results found for: "LABCA2"  No results found for: "CBJ628"  No results found for: "CAN125"  No results found for: "CAN153"  No results found for: "CA2729"  No components found for: "HGQUANT"  No results found for: "CEA1", "CEA" / No results found for: "CEA1", "CEA"   No results found for: "AFPTUMOR"  No results found for: "CHROMOGRNA"  No results found for: "KPAFRELGTCHN", "LAMBDASER", "KAPLAMBRATIO" (kappa/lambda light chains)  No results found for: "HGBA", "HGBA2QUANT", "HGBFQUANT", "HGBSQUAN" (Hemoglobinopathy evaluation)   No results found for: "LDH"  Lab Results  Component Value Date   IRON 94 10/31/2020   TIBC 319 10/31/2020   IRONPCTSAT 29 10/31/2020   (Iron and TIBC)  Lab Results  Component Value Date   FERRITIN 259 10/31/2020    Urinalysis    Component Value Date/Time   COLORURINE YELLOW 11/10/2013 0615   APPEARANCEUR CLEAR 11/10/2013 0615   LABSPEC 1.025 11/10/2013 0615   PHURINE 6.0 11/10/2013 0615   GLUCOSEU NEGATIVE 11/10/2013 0615   HGBUR SMALL (A) 11/10/2013 0615   BILIRUBINUR n 08/29/2016 0834   KETONESUR NEGATIVE 11/10/2013 0615   PROTEINUR n 08/29/2016 0834   PROTEINUR NEGATIVE 11/10/2013 0615   UROBILINOGEN negative 08/29/2016 0834   UROBILINOGEN 0.2 11/10/2013  0615   NITRITE n 08/29/2016 0834   NITRITE NEGATIVE 11/10/2013 0615   LEUKOCYTESUR Negative 08/29/2016 0834    STUDIES: No results found.   ELIGIBLE FOR AVAILABLE RESEARCH PROTOCOL: no  ASSESSMENT: 61 y.o. [facility] woman with a high Tyler Cusick score predicting a lifetime breast cancer risk in the 25% range.  (1) risk reduction: Tamoxifen started October 2021  (2) opted against intensified screening   PLAN: [name] is tolerating tamoxifen well and the plan will be to continue that for 5 years.  By doing that she will contact her risk of developing breast cancer in half. We have once again discussed about adverse effects of tamoxifen including but not limited to DVT/PE, endometrial hyperplasia and endometrial cancer.  She had hysterectomy hence endometrial side effects do not pertain to her. No concerning findings on exam today.  No palpable masses or regional adenopathy.  I have expressed my sincere condolences on the loss of her husband.  I have offered some grief counseling as well as referral to psychology if she needs to talk to someone.  She will otherwise proceed with mammogram as scheduled and return to clinic in January 2025.   She is going to see her OB/GYN in October 2024.  Self breast exam recommended monthly.   Total encounter time 30 minutes.*   *Total Encounter Time as defined by the Centers for Medicare and Medicaid Services includes, in addition to the face-to-face time of a patient visit (documented in the note above) non-face-to-face time: obtaining and reviewing outside history, ordering and reviewing medications, tests or procedures, care coordination (communications with other health care professionals or caregivers) and documentation in the medical record.

## 2022-08-06 ENCOUNTER — Other Ambulatory Visit: Payer: Self-pay | Admitting: Obstetrics and Gynecology

## 2022-08-06 DIAGNOSIS — N763 Subacute and chronic vulvitis: Secondary | ICD-10-CM

## 2022-08-07 NOTE — Telephone Encounter (Signed)
Last AEX 05/07/2022--scheduled for 05/09/2023.  Per AEX notes: Pt uses ointment q other week for vulvar irritation.   Pharmacy reports last refill picked up on 06/12/22.

## 2022-08-09 ENCOUNTER — Ambulatory Visit
Admission: RE | Admit: 2022-08-09 | Discharge: 2022-08-09 | Disposition: A | Discharge status: Home / Self Care | Payer: BC Managed Care – PPO | Source: Ambulatory Visit

## 2022-08-09 DIAGNOSIS — Z1231 Encounter for screening mammogram for malignant neoplasm of breast: Secondary | ICD-10-CM | POA: Diagnosis not present

## 2022-11-23 ENCOUNTER — Encounter: Payer: Self-pay | Admitting: Family Medicine

## 2022-11-23 ENCOUNTER — Ambulatory Visit (INDEPENDENT_AMBULATORY_CARE_PROVIDER_SITE_OTHER): Payer: BC Managed Care – PPO | Admitting: Family Medicine

## 2022-11-23 VITALS — BP 121/71 | HR 73 | Temp 98.1°F | Ht 63.98 in | Wt 138.6 lb

## 2022-11-23 DIAGNOSIS — R7303 Prediabetes: Secondary | ICD-10-CM | POA: Diagnosis not present

## 2022-11-23 DIAGNOSIS — M8580 Other specified disorders of bone density and structure, unspecified site: Secondary | ICD-10-CM

## 2022-11-23 DIAGNOSIS — E611 Iron deficiency: Secondary | ICD-10-CM | POA: Diagnosis not present

## 2022-11-23 DIAGNOSIS — Z79899 Other long term (current) drug therapy: Secondary | ICD-10-CM | POA: Diagnosis not present

## 2022-11-23 DIAGNOSIS — G4700 Insomnia, unspecified: Secondary | ICD-10-CM

## 2022-11-23 DIAGNOSIS — Z0000 Encounter for general adult medical examination without abnormal findings: Secondary | ICD-10-CM | POA: Diagnosis not present

## 2022-11-23 DIAGNOSIS — Z1231 Encounter for screening mammogram for malignant neoplasm of breast: Secondary | ICD-10-CM

## 2022-11-23 DIAGNOSIS — I700 Atherosclerosis of aorta: Secondary | ICD-10-CM

## 2022-11-23 DIAGNOSIS — K219 Gastro-esophageal reflux disease without esophagitis: Secondary | ICD-10-CM

## 2022-11-23 DIAGNOSIS — E559 Vitamin D deficiency, unspecified: Secondary | ICD-10-CM | POA: Diagnosis not present

## 2022-11-23 DIAGNOSIS — Z5181 Encounter for therapeutic drug level monitoring: Secondary | ICD-10-CM

## 2022-11-23 DIAGNOSIS — E782 Mixed hyperlipidemia: Secondary | ICD-10-CM | POA: Diagnosis not present

## 2022-11-23 DIAGNOSIS — Z23 Encounter for immunization: Secondary | ICD-10-CM

## 2022-11-23 LAB — COMPREHENSIVE METABOLIC PANEL
ALT: 12 U/L (ref 0–35)
AST: 15 U/L (ref 0–37)
Albumin: 4.3 g/dL (ref 3.5–5.2)
Alkaline Phosphatase: 26 U/L — ABNORMAL LOW (ref 39–117)
BUN: 31 mg/dL — ABNORMAL HIGH (ref 6–23)
CO2: 29 mEq/L (ref 19–32)
Calcium: 9.9 mg/dL (ref 8.4–10.5)
Chloride: 103 mEq/L (ref 96–112)
Creatinine, Ser: 1.03 mg/dL (ref 0.40–1.20)
GFR: 57.88 mL/min — ABNORMAL LOW (ref >60.00)
Glucose, Bld: 84 mg/dL (ref 70–99)
Potassium: 4.3 mEq/L (ref 3.5–5.1)
Sodium: 139 mEq/L (ref 135–145)
Total Bilirubin: 0.5 mg/dL (ref 0.2–1.2)
Total Protein: 6.4 g/dL (ref 6.0–8.3)

## 2022-11-23 LAB — CBC WITH DIFFERENTIAL/PLATELET
Basophils Absolute: 0 10*3/uL (ref 0.0–0.1)
Basophils Relative: 0.5 % (ref 0.0–3.0)
Eosinophils Absolute: 0.1 10*3/uL (ref 0.0–0.7)
Eosinophils Relative: 1.2 % (ref 0.0–5.0)
HCT: 40.1 % (ref 36.0–46.0)
Hemoglobin: 13.3 g/dL (ref 12.0–15.0)
Lymphocytes Relative: 34.7 % (ref 12.0–46.0)
Lymphs Abs: 1.8 10*3/uL (ref 0.7–4.0)
MCHC: 33.2 g/dL (ref 30.0–36.0)
MCV: 95.9 fl (ref 78.0–100.0)
Monocytes Absolute: 0.3 10*3/uL (ref 0.1–1.0)
Monocytes Relative: 6.7 % (ref 3.0–12.0)
Neutro Abs: 2.9 10*3/uL (ref 1.4–7.7)
Neutrophils Relative %: 56.9 % (ref 43.0–77.0)
Platelets: 312 10*3/uL (ref 150.0–400.0)
RBC: 4.19 Mil/uL (ref 3.87–5.11)
RDW: 13.2 % (ref 11.5–15.5)
WBC: 5.1 10*3/uL (ref 4.0–10.5)

## 2022-11-23 LAB — VITAMIN B12: Vitamin B-12: 315 pg/mL (ref 211–911)

## 2022-11-23 LAB — LIPID PANEL
Cholesterol: 120 mg/dL (ref 0–200)
HDL: 47.7 mg/dL (ref >39.00)
LDL Cholesterol: 57 mg/dL (ref 0–99)
NonHDL: 72.56
Total CHOL/HDL Ratio: 3
Triglycerides: 76 mg/dL (ref 0.0–149.0)
VLDL: 15.2 mg/dL (ref 0.0–40.0)

## 2022-11-23 LAB — VITAMIN D 25 HYDROXY (VIT D DEFICIENCY, FRACTURES): VITD: 33.75 ng/mL (ref 30.00–100.00)

## 2022-11-23 LAB — HEMOGLOBIN A1C: Hgb A1c MFr Bld: 5.6 % (ref 4.6–6.5)

## 2022-11-23 LAB — MAGNESIUM: Magnesium: 2.1 mg/dL (ref 1.5–2.5)

## 2022-11-23 LAB — TSH: TSH: 2.34 u[IU]/mL (ref 0.35–5.50)

## 2022-11-23 MED ORDER — PANTOPRAZOLE SODIUM 20 MG PO TBEC: 20.0000 mg | DELAYED_RELEASE_TABLET | Freq: Every day | ORAL | 3 refills | Status: DC

## 2022-11-23 MED ORDER — ATORVASTATIN CALCIUM 40 MG PO TABS: 40.0000 mg | ORAL_TABLET | Freq: Every day | ORAL | 3 refills | Status: DC

## 2022-11-23 MED ORDER — BACLOFEN 10 MG PO TABS: 10.0000 mg | ORAL_TABLET | Freq: Two times a day (BID) | ORAL | 1 refills | Status: DC

## 2022-11-23 MED ORDER — ZOLPIDEM TARTRATE 5 MG PO TABS
2.5000 mg | ORAL_TABLET | Freq: Every evening | ORAL | 1 refills | Status: DC | PRN
Start: 2022-11-23 — End: 2023-05-13

## 2022-11-23 MED ORDER — FENOFIBRATE 145 MG PO TABS: 145.0000 mg | ORAL_TABLET | Freq: Every day | ORAL | 3 refills | Status: DC

## 2022-11-23 NOTE — Progress Notes (Signed)
Patient ID: [name], female  DOB: 04/25/1961, 62 y.o.   MRN: [id number] Patient Care Team    Relationship Specialty Notifications Start End  Natalia Leatherwood, DO PCP - General Family Medicine  03/29/15   Patton Salles, MD Consulting Physician Obstetrics and Gynecology  10/29/19   Isabella Bowens (Inactive) Counselor Genetic Counselor  10/29/19   Napoleon Form, MD Consulting Physician Gastroenterology  04/13/20     Chief Complaint  Patient presents with   Annual Exam    Pt is fasting    Subjective:  [name] is a 62 y.o.  Female  present for CPE and routine chronic condition management All past medical history, surgical history, allergies, family history, immunizations, medications and social history were updated in the electronic medical record today. All recent labs, ED visits and hospitalizations within the last year were reviewed.  Health maintenance:  Colonoscopy: completed 12/03/2019, 10-year follow-up.  Dr. Lavon Paganini Mammogram: completed: 07/2022. Dr. Edward Jolly orders. Cervical cancer screening: Hysterectomy.  Followed by Dr. Edward Jolly. Immunizations: tdap due, Influenza UTD 2023 (encouraged yearly), Shingrix series completed.  Infectious disease screening: HIV and hep C completed  DEXA: last completed 07/2020, result -1.1 osteopenic, follow up 5-10 . followed by Dr. Edward Jolly Assistive device: None Oxygen use: None Patient has a Dental home. Hospitalizations/ED visits: Reviewed  Insomnia/neck pain Patient reports insomnia is well-controlled on Ambien 2.5-5 mg daily at bedtime. She has been on this medication since ~2012. She reports she routinely needs this medication- sometimes she takes a half tab.. She denies negative side effects. She reports she has continued to use the baclofen BID for neck pain and feels it has been very helpful like to continue.   Mixed hyperlipidemia/ On statin therapy/Family history of premature coronary artery disease/ Patient  reports she is compliance with atorvastatin 40 mg daily, fenofibrate 145 mg daily and she is taking 3600 mg of omega-3 supplement daily. LDL goal less than 70. CT cardiac scoring 02/07/2021: FINDINGS: Coronary Calcium Score: Left main: 0 Left anterior descending artery: 0.9 Left circumflex artery: 1.5 Right coronary artery: 0 Total: 2.4 Percentile: 63rd Pericardium: Normal. Ascending Aorta: Normal caliber. Non-cardiac: See separate report from [facility]. IMPRESSION: Coronary calcium score of 2.4. This was 63rd percentile for age-, race-, and sex-matched controls.   Prediabetes She has continued exercising routinely and has been eating a healthier diet.  She is continue to slowly lose weight over time intentionally.     04/24/2022    8:59 AM 11/07/2021    8:56 AM 05/02/2021    8:11 AM 10/31/2020    8:01 AM 05/04/2019    8:05 AM  Depression screen PHQ 2/9  Decreased Interest 1 0 0 0 0  Down, Depressed, Hopeless 0 0 0 0   PHQ - 2 Score 1 0 0 0 0  Altered sleeping 2  0    Tired, decreased energy 0  0    Change in appetite 0  0    Feeling bad or failure about yourself  0  0    Trouble concentrating 1  0    Moving slowly or fidgety/restless 0  0    Suicidal thoughts 0  0    PHQ-9 Score 4  0        04/24/2022    9:00 AM 05/02/2021    8:11 AM  GAD 7 : Generalized Anxiety Score  Nervous, Anxious, on Edge 1 0  Control/stop worrying 0 0  Worry too  much - different things 0 0  Trouble relaxing 1 0  Restless 0 0  Easily annoyed or irritable 1 0  Afraid - awful might happen 0   Total GAD 7 Score 3     Immunization History  Administered Date(s) Administered   Influenza Inj Mdck Quad Pf 04/18/2018   Influenza, Quadrivalent, Recombinant, Inj, Pf 04/30/2019   Influenza,inj,Quad PF,6+ Mos 05/17/2017, 04/13/2020, 05/02/2021, 04/24/2022   Influenza-Unspecified 06/20/2015, 05/25/2016, 04/30/2019   PFIZER Comirnaty(Gray Top)Covid-19 Tri-Sucrose Vaccine 05/03/2022    PFIZER(Purple Top)SARS-COV-2 Vaccination 10/03/2019, 10/24/2019, 06/18/2020   PPD Test 03/16/2014   Pfizer Covid-19 Vaccine Bivalent Booster 72yrs & up 06/12/2021   Respiratory Syncytial Virus Vaccine,Recomb Aduvanted(Arexvy) 05/03/2022   Tdap 04/03/2013, 11/23/2022   Zoster Recombinat (Shingrix) 10/30/2017, 01/27/2018     Past Medical History:  Diagnosis Date   Anemia    PAST HX- ON IRON    Arthritis    neck   Cataract    BEGINNING    COVID 02/2021   COVID-19 03/03/2021   COVID-19 virus infection 05/2019   Diverticulosis of colon    MILD   Elevated cholesterol with elevated triglycerides    Eustachian tube dysfunction, left 05/22/2018   Family history of breast cancer    Family history of cervical cancer    GERD (gastroesophageal reflux disease)    History of COVID-19 02/27/2021   History of rectocele    Insomnia    started ambien 5 ya.   Osteopenia    --left femur   Periodic heart flutter    SVD (spontaneous vaginal delivery)    x 2   Ulnar nerve compression 03/29/2015   Urinary incontinence    Allergies  Allergen Reactions   Amoxicillin Hives   Sulfa Antibiotics Rash    Childhood rxn   Past Surgical History:  Procedure Laterality Date   BLADDER SUSPENSION N/A 11/10/2013   Procedure: TRANSVAGINAL TAPE (TVT) PROCEDURE;  Surgeon: Jacqualin Combes de Gwenevere Ghazi, MD;  Location: [facility];  Service: Gynecology;  Laterality: N/A;   COLONOSCOPY  2011   [place] [facility]    CYSTOSCOPY N/A 11/10/2013   Procedure: CYSTOSCOPY;  Surgeon: Jacqualin Combes de Gwenevere Ghazi, MD;  Location: [facility];  Service: Gynecology;  Laterality: N/A;   IRRIGATION AND DEBRIDEMENT SEBACEOUS CYST  03/2017   VAGINAL HYSTERECTOMY  11/11    Vag cuff, mild rectocele   WISDOM TOOTH EXTRACTION     Family History  Problem Relation Age of Onset   Arthritis Mother    Hypertension Mother    Hyperlipidemia Mother    Heart disease Mother 79       dec   Hypertension Father    Hyperlipidemia  Father    Heart disease Father 48       smoked for 10 yrs., no muscle damage, 1 MI    Pulmonary fibrosis Father        diagnosed end stage, cause unkn.   Urolithiasis Father    Diverticulosis Sister    Breast cancer Sister 80   Heart disease Brother    Hyperlipidemia Brother    Obesity Brother    Heart attack Brother 65       Passed away at 69 of heart attack   Hyperlipidemia Brother    Hypertension Brother    Kidney Stones Brother    Anti-cardiolipin syndrome Brother    Arthritis Maternal Grandmother    Cervical cancer Maternal Grandmother        diagnosed in her 52s or  13s   Arthritis Paternal Grandmother    Diabetes Son    Asthma Son    Seizures Son    Asthma Son    Colon cancer Neg Hx    Colon polyps Neg Hx    Esophageal cancer Neg Hx    Rectal cancer Neg Hx    Stomach cancer Neg Hx    Social History   Social History Narrative   Relocated to [place] from [place]. Due to husband job transfer. 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 She lives with her husband. She has 2 grown sons- oldest recently graduated from college and took a job in [place].; youngest just graduated HS & ia attending [employer].      Has had regular preventive care in [place]. At [facility] w/Dr. Criss Alvine. Only health concern is insomnia that developed about 5 years ago & has been treated with [place]. She has not practiced sleep hygiene.      Occasionally drinks sweet tea.     Allergies as of 11/23/2022       Reactions   Amoxicillin Hives   Sulfa Antibiotics Rash   Childhood rxn        Medication List        Accurate as of November 23, 2022  9:03 AM. If you have any questions, ask your nurse or doctor.          STOP taking these medications    cyclobenzaprine 10 MG tablet Commonly known as: FLEXERIL Stopped by: Felix Pacini, DO       TAKE these medications    atorvastatin 40 MG tablet Commonly known as:  LIPITOR Take 1 tablet (40 mg total) by mouth daily.   baclofen 10 MG tablet Commonly known as: LIORESAL Take 1 tablet (10 mg total) by mouth 2 (two) times daily.   Caltrate 600+D Plus Minerals 600-800 MG-UNIT Tabs Take by mouth.   clobetasol 0.05 % topical foam Commonly known as: OLUX SMARTSIG:1 sparingly Topical Daily   fenofibrate 145 MG tablet Commonly known as: TRICOR Take 1 tablet (145 mg total) by mouth daily.   finasteride 1 MG tablet Commonly known as: PROPECIA Take 1 mg by mouth daily.   Fusion Plus Caps Take 1 capsule by mouth daily.   Omega 3 1200 MG Caps Take 2 capsules by mouth.   pantoprazole 20 MG tablet Commonly known as: PROTONIX Take 1 tablet (20 mg total) by mouth daily.   spironolactone 50 MG tablet Commonly known as: ALDACTONE Take 50 mg by mouth 2 (two) times daily.   tamoxifen 20 MG tablet Commonly known as: NOLVADEX Take 1 tablet (20 mg total) by mouth daily.   triamcinolone ointment 0.5 % Commonly known as: KENALOG APPLY TO AFFECTED AREA IN A THIN LAYER TWICE DAILY FOR 2 WEEKS AS NEEDED FOR A FLARE OF IRRITATION.   zolpidem 5 MG tablet Commonly known as: AMBIEN Take 0.5-1 tablets (2.5-5 mg total) by mouth at bedtime as needed for sleep.        All past medical history, surgical history, allergies, family history, immunizations andmedications were updated in the EMR today and reviewed under the history and medication portions of their EMR.     No results found for this or any previous visit (from the past 2160 hour(s)).  ROS 14 pt review of systems performed and negative (unless mentioned in an HPI)  Objective BP 121/71  Pulse 73   Temp 98.1 F (36.7 C)   Ht 5' 3.98" (1.625 m)   Wt 138 lb 9.6 oz (62.9 kg)   LMP 07/30/2009 (Within Years)   SpO2 98%   BMI 23.81 kg/m  Physical Exam Vitals and nursing note reviewed.  Constitutional:      General: She is not in acute distress.    Appearance: Normal appearance. She is not  ill-appearing or toxic-appearing.  HENT:     Head: Normocephalic and atraumatic.     Right Ear: Tympanic membrane, ear canal and external ear normal. There is no impacted cerumen.     Left Ear: Tympanic membrane, ear canal and external ear normal. There is no impacted cerumen.     Nose: No congestion or rhinorrhea.     Mouth/Throat:     Mouth: Mucous membranes are moist.     Pharynx: Oropharynx is clear. No oropharyngeal exudate or posterior oropharyngeal erythema.  Eyes:     General: No scleral icterus.       Right eye: No discharge.        Left eye: No discharge.     Extraocular Movements: Extraocular movements intact.     Conjunctiva/sclera: Conjunctivae normal.     Pupils: Pupils are equal, round, and reactive to light.  Cardiovascular:     Rate and Rhythm: Normal rate and regular rhythm.     Pulses: Normal pulses.     Heart sounds: Normal heart sounds. No murmur heard.    No friction rub. No gallop.  Pulmonary:     Effort: Pulmonary effort is normal. No respiratory distress.     Breath sounds: Normal breath sounds. No stridor. No wheezing, rhonchi or rales.  Chest:     Chest wall: No tenderness.  Abdominal:     General: Abdomen is flat. Bowel sounds are normal. There is no distension.     Palpations: Abdomen is soft. There is no mass.     Tenderness: There is no abdominal tenderness. There is no right CVA tenderness, left CVA tenderness, guarding or rebound.     Hernia: No hernia is present.  Musculoskeletal:        General: No swelling, tenderness or deformity. Normal range of motion.     Cervical back: Normal range of motion and neck supple. No rigidity or tenderness.     Right lower leg: No edema.     Left lower leg: No edema.  Lymphadenopathy:     Cervical: No cervical adenopathy.  Skin:    General: Skin is warm and dry.     Coloration: Skin is not jaundiced or pale.     Findings: No bruising, erythema, lesion or rash.  Neurological:     General: No focal deficit  present.     Mental Status: She is alert and oriented to person, place, and time. Mental status is at baseline.     Cranial Nerves: No cranial nerve deficit.     Sensory: No sensory deficit.     Motor: No weakness.     Coordination: Coordination normal.     Gait: Gait normal.     Deep Tendon Reflexes: Reflexes normal.  Psychiatric:        Mood and Affect: Mood normal.        Behavior: Behavior normal.        Thought Content: Thought content normal.        Judgment: Judgment normal.      No results found.  Assessment/plan: [name]  is a 62 y.o. female present for CPE and Chronic Conditions/illness Management Gastroesophageal reflux disease without esophagitis/long term PPI stable Continue Protonix. She takes QOD if possible and QD if flares . b12, mag collected today   Insomnia, unspecified type Stable Continue ambien 2.5-5 mg QHS today.  - [employer] reviewed today - Contract signed.    Mixed hyperlipidemia/ On statin therapy/Family history of premature coronary artery disease/coronary calcium score 2.4/63rd percentile/aortic atherosclerosis Patient is in this 63rd percentile for cardiac calcium scoring with a family history.  2023 LDL goal less than 70 - She has made great dietary and exercise changes.  -Continue Lipitor -Continue fish oil 3600 - Continue fenofibrate CBC, CMP, lipids and TSH collected today    Prediabetes Has been well controlled with her exercise and dietary changes.  She continues to lose weight slowly. - monitor yearly as long as < 5.9 and fbg normal A1c collected today   Vitamin D deficiency/Osteopenia - taking her daily supplement Bone density UTD through GYN Vit d collected today   Iron deficiency: Taking daily iron.  Iron collected today  Routine general medical examination at a health care facility Patient was encouraged to exercise greater than 150 minutes a week. Patient was encouraged to choose a diet filled with fresh  fruits and vegetables, and lean meats. AVS provided to patient today for education/recommendation on gender specific health and safety maintenance. Colonoscopy: completed 12/03/2019, 10-year follow-up.  Dr. Lavon Paganini Mammogram: completed: 07/2022. Dr. Edward Jolly orders. Cervical cancer screening: Hysterectomy.  Followed by Dr. Edward Jolly. Immunizations: tdap updated today, Influenza UTD 2023 (encouraged yearly), Shingrix series completed.  Infectious disease screening: HIV and hep C completed  DEXA: last completed 07/2020, result -1.1 osteopenic, follow up 5-10 . followed by Dr. Edward Jolly  Return in about 24 weeks (around 05/10/2023) for Routine chronic condition follow-up.  Orders Placed This Encounter  Procedures   Tdap vaccine greater than or equal to 7yo IM   CBC with Differential/Platelet   Comprehensive metabolic panel   Hemoglobin A1c   Lipid panel   TSH   Iron, TIBC and Ferritin Panel   B12   Magnesium   Vitamin D (25 hydroxy)   Meds ordered this encounter  Medications   atorvastatin (LIPITOR) 40 MG tablet    Sig: Take 1 tablet (40 mg total) by mouth daily.    Dispense:  90 tablet    Refill:  3   baclofen (LIORESAL) 10 MG tablet    Sig: Take 1 tablet (10 mg total) by mouth 2 (two) times daily.    Dispense:  180 each    Refill:  1   fenofibrate (TRICOR) 145 MG tablet    Sig: Take 1 tablet (145 mg total) by mouth daily.    Dispense:  90 tablet    Refill:  3   pantoprazole (PROTONIX) 20 MG tablet    Sig: Take 1 tablet (20 mg total) by mouth daily.    Dispense:  90 tablet    Refill:  3   zolpidem (AMBIEN) 5 MG tablet    Sig: Take 0.5-1 tablets (2.5-5 mg total) by mouth at bedtime as needed for sleep.    Dispense:  90 tablet    Refill:  1   Referral Orders  No referral(s) requested today    Electronically signed by: Felix Pacini, DO [facility]

## 2022-11-23 NOTE — Patient Instructions (Addendum)
Return in about 24 weeks (around 05/10/2023) for Routine chronic condition follow-up.        Great to see you today.  I have refilled the medication(s) we provide.   If labs were collected, we will inform you of lab results once received either by echart message or telephone call.   - echart message- for normal results that have been seen by the patient already.   - telephone call: abnormal results or if patient has not viewed results in their echart.  

## 2022-11-24 LAB — IRON,TIBC AND FERRITIN PANEL
%SAT: 28 % (calc) (ref 16–45)
Ferritin: 211 ng/mL (ref 16–288)
Iron: 104 ug/dL (ref 45–160)
TIBC: 365 mcg/dL (calc) (ref 250–450)

## 2022-12-05 DIAGNOSIS — L821 Other seborrheic keratosis: Secondary | ICD-10-CM | POA: Diagnosis not present

## 2022-12-05 DIAGNOSIS — L661 Lichen planopilaris: Secondary | ICD-10-CM | POA: Diagnosis not present

## 2022-12-05 DIAGNOSIS — Z79899 Other long term (current) drug therapy: Secondary | ICD-10-CM | POA: Diagnosis not present

## 2022-12-27 NOTE — Progress Notes (Signed)
GYNECOLOGY  VISIT   HPI: 62 y.o.   Married  Caucasian  female   G2P2 with Patient's last menstrual period was 07/30/2009 (within years).   here for   vaginal itching worsening with menopause. Pt does not think this is infection. Pt has tried vitamin E suppository but struggles to insert and feels discomfort.  Had some bleeding with use of the applicator.  Itching used to be the top of the vulva.  Now it is the whole vulva.  The more she uses the Triamcinolone, the more she has symptoms.   No internal vaginal symptoms.  No new product use.  On Tamoxifen due to increased risk of breast cancer.   Doing Grief Share following the passing of her husband in July, 2023.  The group counseling will be coming to an end.   GYNECOLOGIC HISTORY: Patient's last menstrual period was 07/30/2009 (within years). Contraception:  hysterectomy. Menopausal hormone therapy:  n/a Last mammogram:  08/09/22 density B Bi-rads 1 neg  Last pap smear:   2013 neg        OB History     Gravida  2   Para  2   Term      Preterm      AB      Living  2      SAB      IAB      Ectopic      Multiple      Live Births           Obstetric Comments  Menarche 62 yo.            Patient Active Problem List   Diagnosis Date Noted   Aortic atherosclerosis (HCC) 02/09/2021   Agatston coronary artery calcium score less than 100 02/09/2021   Iron deficiency 04/13/2020   Family history of breast cancer    Family history of cervical cancer    At high risk for breast cancer 05/06/2019   Prediabetes 05/22/2018   Mixed hyperlipidemia 05/22/2018   On statin therapy 05/22/2018   GERD (gastroesophageal reflux disease) 01/02/2016   Chronic low back pain 09/16/2015   Osteopenia 08/11/2015   Vitamin D deficiency 11/23/2014   Recurrent low back pain 01/20/2013   Insomnia 01/19/2013   Family history of premature coronary artery disease 01/19/2013    Past Medical History:  Diagnosis Date   Anemia     PAST HX- ON IRON    Arthritis    neck   Cataract    BEGINNING    COVID 02/2021   COVID-19 03/03/2021   COVID-19 virus infection 05/2019   Diverticulosis of colon    MILD   Elevated cholesterol with elevated triglycerides    Eustachian tube dysfunction, left 05/22/2018   Family history of breast cancer    Family history of cervical cancer    GERD (gastroesophageal reflux disease)    History of COVID-19 02/27/2021   History of rectocele    Insomnia    started ambien 5 ya.   Osteopenia    --left femur   Periodic heart flutter    SVD (spontaneous vaginal delivery)    x 2   Ulnar nerve compression 03/29/2015   Urinary incontinence     Past Surgical History:  Procedure Laterality Date   BLADDER SUSPENSION N/A 11/10/2013   Procedure: TRANSVAGINAL TAPE (TVT) PROCEDURE;  Surgeon: Jacqualin Combes de Gwenevere Ghazi, MD;  Location: [facility];  Service: Gynecology;  Laterality: N/A;   COLONOSCOPY  2011  [place] [facility]    CYSTOSCOPY N/A 11/10/2013   Procedure: CYSTOSCOPY;  Surgeon: Jacqualin Combes de Gwenevere Ghazi, MD;  Location: [facility];  Service: Gynecology;  Laterality: N/A;   IRRIGATION AND DEBRIDEMENT SEBACEOUS CYST  03/2017   VAGINAL HYSTERECTOMY  11/11    Vag cuff, mild rectocele   WISDOM TOOTH EXTRACTION      Current Outpatient Medications  Medication Sig Dispense Refill   atorvastatin (LIPITOR) 40 MG tablet Take 1 tablet (40 mg total) by mouth daily. 90 tablet 3   baclofen (LIORESAL) 10 MG tablet Take 1 tablet (10 mg total) by mouth 2 (two) times daily. 180 each 1   Calcium Carbonate-Vit D-Min (CALTRATE 600+D PLUS MINERALS) 600-800 MG-UNIT TABS Take by mouth.     clobetasol ointment (TEMOVATE) 0.05 % Apply 1 Application topically 2 (two) times daily. Apply in a thin layer two times daily for 2 weeks as needed for a flare.  Place on the area at night twice a week for maintenance dosing. 60 g 0   fenofibrate (TRICOR) 145 MG tablet Take 1 tablet (145 mg total) by mouth  daily. 90 tablet 3   finasteride (PROPECIA) 1 MG tablet Take 1 mg by mouth daily.     Iron-FA-B Cmp-C-Biot-Probiotic (FUSION PLUS) CAPS Take 1 capsule by mouth daily.     Omega 3 1200 MG CAPS Take 2 capsules by mouth.     pantoprazole (PROTONIX) 20 MG tablet Take 1 tablet (20 mg total) by mouth daily. 90 tablet 3   spironolactone (ALDACTONE) 50 MG tablet Take 50 mg by mouth 2 (two) times daily.     tamoxifen (NOLVADEX) 20 MG tablet Take 1 tablet (20 mg total) by mouth daily. 90 tablet 6   zolpidem (AMBIEN) 5 MG tablet Take 0.5-1 tablets (2.5-5 mg total) by mouth at bedtime as needed for sleep. 90 tablet 1   No current facility-administered medications for this visit.     ALLERGIES: Amoxicillin and Sulfa antibiotics  Family History  Problem Relation Age of Onset   Arthritis Mother    Hypertension Mother    Hyperlipidemia Mother    Heart disease Mother 88       dec   Hypertension Father    Hyperlipidemia Father    Heart disease Father 51       smoked for 10 yrs., no muscle damage, 1 MI    Pulmonary fibrosis Father        diagnosed end stage, cause unkn.   Urolithiasis Father    Diverticulosis Sister    Breast cancer Sister 69   Heart disease Brother    Hyperlipidemia Brother    Obesity Brother    Heart attack Brother 74       Passed away at 49 of heart attack   Hyperlipidemia Brother    Hypertension Brother    Kidney Stones Brother    Anti-cardiolipin syndrome Brother    Arthritis Maternal Grandmother    Cervical cancer Maternal Grandmother        diagnosed in her 61s or 32s   Arthritis Paternal Grandmother    Diabetes Son    Asthma Son    Seizures Son    Asthma Son    Colon cancer Neg Hx    Colon polyps Neg Hx    Esophageal cancer Neg Hx    Rectal cancer Neg Hx    Stomach cancer Neg Hx     Social History   Socioeconomic History   Marital status: Married  Spouse name: Cheree Ditto   Number of children: 2   Years of education: Masters   Highest education level:  Master's degree (e.g., MA, MS, MEng, MEd, MSW, MBA)  Occupational History   Occupation: Runner, broadcasting/film/video, reading specialist k-6    Comment: Retired  Tobacco Use   Smoking status: Never   Smokeless tobacco: Never  Vaping Use   Vaping Use: Never used  Substance and Sexual Activity   Alcohol use: Yes    Alcohol/week: 0.0 standard drinks of alcohol    Comment: 1 glass of wine/month   Drug use: No   Sexual activity: Not Currently    Partners: Male    Birth control/protection: Surgical    Comment: hysterectomy--still has ovaries  Other Topics Concern   Not on file  Social History Narrative   Relocated to [facility] from [place] Due to husband job transfer. 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 She lives with her husband. She has 2 grown sons- oldest recently graduated from college and took a job in [place].; youngest just graduated HS & ia attending [employer].      Has had regular preventive care in [place]. At [facility] w/Dr. Criss Alvine. Only health concern is insomnia that developed about 5 years ago & has been treated with [place]. She has not practiced sleep hygiene.      Occasionally drinks sweet tea.    Social Determinants of Health   Financial Resource Strain: Low Risk  (04/20/2022)   Overall Financial Resource Strain (CARDIA)    Difficulty of Paying Living Expenses: Not hard at all  Food Insecurity: No Food Insecurity (04/20/2022)   Hunger Vital Sign    Worried About Running Out of Food in the Last Year: Never true    Ran Out of Food in the Last Year: Never true  Transportation Needs: No Transportation Needs (04/20/2022)   PRAPARE - Administrator, Civil Service (Medical): No    Lack of Transportation (Non-Medical): No  Physical Activity: Insufficiently Active (04/20/2022)   Exercise Vital Sign    Days of Exercise per Week: 3 days    Minutes of Exercise per Session: 30 min  Stress: Stress Concern  Present (04/20/2022)   [employer] of Occupational Health - Occupational Stress Questionnaire    Feeling of Stress : Very much  Social Connections: Moderately Isolated (04/20/2022)   Social Connection and Isolation Panel [NHANES]    Frequency of Communication with Friends and Family: More than three times a week    Frequency of Social Gatherings with Friends and Family: Once a week    Attends Religious Services: More than 4 times per year    Active Member of [employer] or Organizations: No    Attends Banker Meetings: Not on file    Marital Status: Widowed  Intimate Partner Violence: Not on file    Review of Systems  All other systems reviewed and are negative.   PHYSICAL EXAMINATION:    BP 118/74 (BP Location: Left Arm, Patient Position: Sitting, Cuff Size: Normal)   Pulse 70   Ht 5\' 3"  (1.6 m)   Wt 139 lb (63 kg)   LMP 07/30/2009 (Within Years)   SpO2 95%   BMI 24.62 kg/m     General appearance: alert, cooperative and appears stated age  Pelvic: External genitalia:  no lesions  Urethra:  normal appearing urethra with no masses, tenderness or lesions              Bartholins and Skenes: normal                 Vagina: normal appearing vagina with normal color and discharge, no lesions.  Almost second degree cystocele with Valsalva.                Cervix: absent                Bimanual Exam:  Uterus:  absent              Adnexa: no mass, fullness, tenderness      Chaperone was present for exam:  Warren Lacy, CMA  ASSESSMENT  Vulvitis.  Vaginal atrophy.  On Tamoxifen due to high risk of breast cancer.  Second degree cystocele.  Bereavement.   PLAN  Wet prep:  negative yeast, negative clue cells, negative trichomonas.  Rx for Clobetasol.  Instructed in use.  Consider vulvar biopsy if symptoms do not improve. Consider vaginal estrogen treatment as long as she is on Tamoxifen.  No vaginal estrogen Rx given today. Cystocele discussed.  Ok for  Database administrator.  She will contact me if she has urinary incontinence, difficulty voiding, urinary tract infections, or vaginal protrusion.  Support given for her bereavement journey.  I encouraged her to consider continued counseling.    An After Visit Summary was printed and given to the patient.  27 min  total time was spent for this patient encounter, including preparation, face-to-face counseling with the patient, coordination of care, and documentation of the encounter.

## 2022-12-31 ENCOUNTER — Ambulatory Visit: Payer: BC Managed Care – PPO | Admitting: Obstetrics and Gynecology

## 2023-01-02 DIAGNOSIS — H2513 Age-related nuclear cataract, bilateral: Secondary | ICD-10-CM | POA: Diagnosis not present

## 2023-01-03 ENCOUNTER — Encounter: Payer: Self-pay | Admitting: Obstetrics and Gynecology

## 2023-01-03 ENCOUNTER — Ambulatory Visit (INDEPENDENT_AMBULATORY_CARE_PROVIDER_SITE_OTHER): Payer: BC Managed Care – PPO | Admitting: Obstetrics and Gynecology

## 2023-01-03 VITALS — BP 118/74 | HR 70 | Ht 63.0 in | Wt 139.0 lb

## 2023-01-03 DIAGNOSIS — N763 Subacute and chronic vulvitis: Secondary | ICD-10-CM | POA: Diagnosis not present

## 2023-01-03 DIAGNOSIS — N8110 Cystocele, unspecified: Secondary | ICD-10-CM

## 2023-01-03 LAB — WET PREP FOR TRICH, YEAST, CLUE

## 2023-01-03 MED ORDER — CLOBETASOL PROPIONATE 0.05 % EX OINT: 1.0000 | TOPICAL_OINTMENT | Freq: Two times a day (BID) | CUTANEOUS | 0 refills | Status: DC

## 2023-01-11 ENCOUNTER — Encounter: Payer: Self-pay | Admitting: Obstetrics and Gynecology

## 2023-01-11 ENCOUNTER — Ambulatory Visit (INDEPENDENT_AMBULATORY_CARE_PROVIDER_SITE_OTHER): Payer: BC Managed Care – PPO | Admitting: Obstetrics and Gynecology

## 2023-01-11 VITALS — BP 126/74 | Ht 63.0 in | Wt 139.0 lb

## 2023-01-11 DIAGNOSIS — L02818 Cutaneous abscess of other sites: Secondary | ICD-10-CM | POA: Diagnosis not present

## 2023-01-11 MED ORDER — DOXYCYCLINE HYCLATE 100 MG PO CAPS
100.0000 mg | ORAL_CAPSULE | Freq: Two times a day (BID) | ORAL | 0 refills | Status: DC
Start: 2023-01-11 — End: 2023-02-26

## 2023-01-11 MED ORDER — METRONIDAZOLE 500 MG PO TABS
500.0000 mg | ORAL_TABLET | Freq: Two times a day (BID) | ORAL | 0 refills | Status: DC
Start: 2023-01-11 — End: 2023-02-26

## 2023-01-11 NOTE — Progress Notes (Signed)
GYNECOLOGY  VISIT   HPI: 62 y.o.   Married [place] or Caucasian Not Hispanic or Latino  female   G2P2 with Patient's last menstrual period was 07/30/2009 (within years).   here for a painful cyst on her buttock, started on Monday. Pt noticed pain on her [place] the [place]. Pt stated it opened up yesterday and is slowly draining. Not as painful as it was, getting smaller.   GYNECOLOGIC HISTORY: Patient's last menstrual period was 07/30/2009 (within years). Contraception:hyst Menopausal hormone therapy: n/a        OB History     Gravida  2   Para  2   Term      Preterm      AB      Living  2      SAB      IAB      Ectopic      Multiple      Live Births           Obstetric Comments  Menarche 62 yo.            Patient Active Problem List   Diagnosis Date Noted   Aortic atherosclerosis (HCC) 02/09/2021   Agatston coronary artery calcium score less than 100 02/09/2021   Iron deficiency 04/13/2020   Family history of breast cancer    Family history of cervical cancer    At high risk for breast cancer 05/06/2019   Prediabetes 05/22/2018   Mixed hyperlipidemia 05/22/2018   On statin therapy 05/22/2018   GERD (gastroesophageal reflux disease) 01/02/2016   Chronic low back pain 09/16/2015   Osteopenia 08/11/2015   Vitamin D deficiency 11/23/2014   Recurrent low back pain 01/20/2013   Insomnia 01/19/2013   Family history of premature coronary artery disease 01/19/2013    Past Medical History:  Diagnosis Date   Anemia    PAST HX- ON IRON    Arthritis    neck   Cataract    BEGINNING    COVID 02/2021   COVID-19 03/03/2021   COVID-19 virus infection 05/2019   Diverticulosis of colon    MILD   Elevated cholesterol with elevated triglycerides    Eustachian tube dysfunction, left 05/22/2018   Family history of breast cancer    Family history of cervical cancer    GERD (gastroesophageal reflux disease)    History of COVID-19 02/27/2021    History of rectocele    Insomnia    started ambien 5 ya.   Osteopenia    --left femur   Periodic heart flutter    SVD (spontaneous vaginal delivery)    x 2   Ulnar nerve compression 03/29/2015   Urinary incontinence     Past Surgical History:  Procedure Laterality Date   BLADDER SUSPENSION N/A 11/10/2013   Procedure: TRANSVAGINAL TAPE (TVT) PROCEDURE;  Surgeon: Jacqualin Combes de Gwenevere Ghazi, MD;  Location: [facility];  Service: Gynecology;  Laterality: N/A;   COLONOSCOPY  2011   [place] [facility]    CYSTOSCOPY N/A 11/10/2013   Procedure: CYSTOSCOPY;  Surgeon: Jacqualin Combes de Gwenevere Ghazi, MD;  Location: [facility];  Service: Gynecology;  Laterality: N/A;   IRRIGATION AND DEBRIDEMENT SEBACEOUS CYST  03/2017   VAGINAL HYSTERECTOMY  11/11    Vag cuff, mild rectocele   WISDOM TOOTH EXTRACTION      Current Outpatient Medications  Medication Sig Dispense Refill   atorvastatin (LIPITOR) 40 MG tablet Take 1 tablet (40 mg total)  by mouth daily. 90 tablet 3   baclofen (LIORESAL) 10 MG tablet Take 1 tablet (10 mg total) by mouth 2 (two) times daily. 180 each 1   Calcium Carbonate-Vit D-Min (CALTRATE 600+D PLUS MINERALS) 600-800 MG-UNIT TABS Take by mouth.     clobetasol ointment (TEMOVATE) 0.05 % Apply 1 Application topically 2 (two) times daily. Apply in a thin layer two times daily for 2 weeks as needed for a flare.  Place on the area at night twice a week for maintenance dosing. 60 g 0   fenofibrate (TRICOR) 145 MG tablet Take 1 tablet (145 mg total) by mouth daily. 90 tablet 3   finasteride (PROPECIA) 1 MG tablet Take 1 mg by mouth daily.     Iron-FA-B Cmp-C-Biot-Probiotic (FUSION PLUS) CAPS Take 1 capsule by mouth daily.     Omega 3 1200 MG CAPS Take 2 capsules by mouth.     pantoprazole (PROTONIX) 20 MG tablet Take 1 tablet (20 mg total) by mouth daily. 90 tablet 3   spironolactone (ALDACTONE) 50 MG tablet Take 50 mg by mouth 2 (two) times daily.     tamoxifen (NOLVADEX) 20 MG  tablet Take 1 tablet (20 mg total) by mouth daily. 90 tablet 6   zolpidem (AMBIEN) 5 MG tablet Take 0.5-1 tablets (2.5-5 mg total) by mouth at bedtime as needed for sleep. 90 tablet 1   No current facility-administered medications for this visit.     ALLERGIES: Amoxicillin and Sulfa antibiotics  Family History  Problem Relation Age of Onset   Arthritis Mother    Hypertension Mother    Hyperlipidemia Mother    Heart disease Mother 68       dec   Hypertension Father    Hyperlipidemia Father    Heart disease Father 57       smoked for 10 yrs., no muscle damage, 1 MI    Pulmonary fibrosis Father        diagnosed end stage, cause unkn.   Urolithiasis Father    Diverticulosis Sister    Breast cancer Sister 48   Heart disease Brother    Hyperlipidemia Brother    Obesity Brother    Heart attack Brother 74       Passed away at 90 of heart attack   Hyperlipidemia Brother    Hypertension Brother    Kidney Stones Brother    Anti-cardiolipin syndrome Brother    Arthritis Maternal Grandmother    Cervical cancer Maternal Grandmother        diagnosed in her 24s or 60s   Arthritis Paternal Grandmother    Diabetes Son    Asthma Son    Seizures Son    Asthma Son    Colon cancer Neg Hx    Colon polyps Neg Hx    Esophageal cancer Neg Hx    Rectal cancer Neg Hx    Stomach cancer Neg Hx     Social History   Socioeconomic History   Marital status: Married    Spouse name: Cheree Ditto   Number of children: 2   Years of education: Masters   [employer] education level: Master's degree (e.g., MA, MS, MEng, MEd, MSW, MBA)  Occupational History   Occupation: Runner, broadcasting/film/video, reading specialist k-6    Comment: Retired  Tobacco Use   Smoking status: Never   Smokeless tobacco: Never  Vaping Use   Vaping Use: Never used  Substance and Sexual Activity   Alcohol use: Yes    Alcohol/week: 0.0 standard drinks of  alcohol    Comment: 1 glass of wine/month   Drug use: No   Sexual activity: Not Currently     Partners: Male    Birth control/protection: Surgical    Comment: hysterectomy--still has ovaries  Other Topics Concern   Not on file  Social History Narrative   Relocated to [facility] from [place] Due to husband job transfer. 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 She lives with her husband. She has 2 grown sons- oldest recently graduated from college and took a job in [place].; youngest just graduated HS & ia attending [employer].      Has had regular preventive care in [place]. At [facility] w/Dr. Criss Alvine. Only health concern is insomnia that developed about 5 years ago & has been treated with [place]. She has not practiced sleep hygiene.      Occasionally drinks sweet tea.    Social Determinants of Health   Financial Resource Strain: Low Risk  (04/20/2022)   Overall Financial Resource Strain (CARDIA)    Difficulty of Paying Living Expenses: Not hard at all  Food Insecurity: No Food Insecurity (04/20/2022)   Hunger Vital Sign    Worried About Running Out of Food in the Last Year: Never true    Ran Out of Food in the Last Year: Never true  Transportation Needs: No Transportation Needs (04/20/2022)   PRAPARE - Administrator, Civil Service (Medical): No    Lack of Transportation (Non-Medical): No  Physical Activity: Insufficiently Active (04/20/2022)   Exercise Vital Sign    Days of Exercise per Week: 3 days    Minutes of Exercise per Session: 30 min  Stress: Stress Concern Present (04/20/2022)   [employer] of Occupational Health - Occupational Stress Questionnaire    Feeling of Stress : Very much  Social Connections: Moderately Isolated (04/20/2022)   Social Connection and Isolation Panel [NHANES]    Frequency of Communication with Friends and Family: More than three times a week    Frequency of Social Gatherings with Friends and Family: Once a week    Attends Religious Services: More than 4  times per year    Active Member of [employer] or Organizations: No    Attends Banker Meetings: Not on file    Marital Status: Widowed  Intimate Partner Violence: Not on file    Review of Systems  All other systems reviewed and are negative.   PHYSICAL EXAMINATION:    LMP 07/30/2009 (Within Years)     General appearance: alert, cooperative and appears stated age   Pelvic: External genitalia:  there is an ~ 4 cm area of raised induration on the inner left buttock, there is an ~1.5 cm area in the center of the indurated area when the skin is excoriated, minimal drainage.  Procedure was reviewed and a consent was signed. The area was cleansed with hibaclens and injected with 1% lidocaine, an ~1-1.5 incision was made with a #11 blade, minimal drainage noted. Culture obtained. Cavity explored with a cotton swab, no significant extension.   Chaperone was present for exam.  1. Cutaneous abscess of other site Abscess of left buttock -I&D done with minimal drainage -Wound culture sent - doxycycline (VIBRAMYCIN) 100 MG capsule; Take 1 capsule (100 mg total) by mouth 2 (two) times daily. Take BID for 7 days.  Take with food as can cause GI distress.  Dispense: 14  capsule; Refill: 0 - metroNIDAZOLE (FLAGYL) 500 MG tablet; Take 1 tablet (500 mg total) by mouth 2 (two) times daily.  Dispense: 14 tablet; Refill: 0 -Recommended warm compresses/hot soaks -F/U early next week -she will F/U in the ER if her symptoms worsen  -call with any concerns

## 2023-01-13 LAB — WOUND CULTURE: RESULT:: NO GROWTH

## 2023-01-15 ENCOUNTER — Ambulatory Visit (INDEPENDENT_AMBULATORY_CARE_PROVIDER_SITE_OTHER): Payer: BC Managed Care – PPO | Admitting: Obstetrics and Gynecology

## 2023-01-15 ENCOUNTER — Encounter: Payer: Self-pay | Admitting: Obstetrics and Gynecology

## 2023-01-15 VITALS — BP 110/74 | HR 86 | Wt 141.0 lb

## 2023-01-15 DIAGNOSIS — L02818 Cutaneous abscess of other sites: Secondary | ICD-10-CM

## 2023-01-15 LAB — WOUND CULTURE
MICRO NUMBER:: 15084584
SPECIMEN QUALITY:: ADEQUATE

## 2023-01-15 NOTE — Progress Notes (Signed)
GYNECOLOGY  VISIT   HPI: 62 y.o.   Married Ahmad or Caucasian Not Hispanic or Latino  female   G2P2 with Patient's last menstrual period was 07/30/2009 (within years).   here for a follow up for an abscess. Patient states that she is feel much better.   She was seen last Friday with an abscess on her left buttock. I&D was performed and she was started on antibiotics. The culture returned without growth.  She is feeling much better, the pain and swelling are improving.   GYNECOLOGIC HISTORY: Patient's last menstrual period was 07/30/2009 (within years). Contraception:pmp  Menopausal hormone therapy: none         OB History     Gravida  2   Para  2   Term      Preterm      AB      Living  2      SAB      IAB      Ectopic      Multiple      Live Births           Obstetric Comments  Menarche 62 yo.            Patient Active Problem List   Diagnosis Date Noted   Aortic atherosclerosis (HCC) 02/09/2021   Agatston coronary artery calcium score less than 100 02/09/2021   Iron deficiency 04/13/2020   Family history of breast cancer    Family history of cervical cancer    At high risk for breast cancer 05/06/2019   Prediabetes 05/22/2018   Mixed hyperlipidemia 05/22/2018   On statin therapy 05/22/2018   GERD (gastroesophageal reflux disease) 01/02/2016   Chronic low back pain 09/16/2015   Osteopenia 08/11/2015   Vitamin D deficiency 11/23/2014   Recurrent low back pain 01/20/2013   Insomnia 01/19/2013   Family history of premature coronary artery disease 01/19/2013    Past Medical History:  Diagnosis Date   Anemia    PAST HX- ON IRON    Arthritis    neck   Cataract    BEGINNING    COVID 02/2021   COVID-19 03/03/2021   COVID-19 virus infection 05/2019   Diverticulosis of colon    MILD   Elevated cholesterol with elevated triglycerides    Eustachian tube dysfunction, left 05/22/2018   Family history of breast cancer    Family history of cervical  cancer    GERD (gastroesophageal reflux disease)    History of COVID-19 02/27/2021   History of rectocele    Insomnia    started ambien 5 ya.   Osteopenia    --left femur   Periodic heart flutter    SVD (spontaneous vaginal delivery)    x 2   Ulnar nerve compression 03/29/2015   Urinary incontinence     Past Surgical History:  Procedure Laterality Date   BLADDER SUSPENSION N/A 11/10/2013   Procedure: TRANSVAGINAL TAPE (TVT) PROCEDURE;  Surgeon: Jacqualin Combes de Gwenevere Ghazi, MD;  Location: [facility];  Service: Gynecology;  Laterality: N/A;   COLONOSCOPY  2011   [place] [facility]    CYSTOSCOPY N/A 11/10/2013   Procedure: CYSTOSCOPY;  Surgeon: Jacqualin Combes de Gwenevere Ghazi, MD;  Location: [facility];  Service: Gynecology;  Laterality: N/A;   IRRIGATION AND DEBRIDEMENT SEBACEOUS CYST  03/2017   VAGINAL HYSTERECTOMY  11/11    Vag cuff, mild rectocele   WISDOM TOOTH EXTRACTION      Current Outpatient Medications  Medication Sig Dispense Refill   atorvastatin (LIPITOR) 40 MG tablet Take 1 tablet (40 mg total) by mouth daily. 90 tablet 3   baclofen (LIORESAL) 10 MG tablet Take 1 tablet (10 mg total) by mouth 2 (two) times daily. 180 each 1   Calcium Carbonate-Vit D-Min (CALTRATE 600+D PLUS MINERALS) 600-800 MG-UNIT TABS Take by mouth.     clobetasol ointment (TEMOVATE) 0.05 % Apply 1 Application topically 2 (two) times daily. Apply in a thin layer two times daily for 2 weeks as needed for a flare.  Place on the area at night twice a week for maintenance dosing. 60 g 0   doxycycline (VIBRAMYCIN) 100 MG capsule Take 1 capsule (100 mg total) by mouth 2 (two) times daily. Take BID for 7 days.  Take with food as can cause GI distress. 14 capsule 0   fenofibrate (TRICOR) 145 MG tablet Take 1 tablet (145 mg total) by mouth daily. 90 tablet 3   finasteride (PROPECIA) 1 MG tablet Take 1 mg by mouth daily.     Iron-FA-B Cmp-C-Biot-Probiotic (FUSION PLUS) CAPS Take 1 capsule by mouth daily.      metroNIDAZOLE (FLAGYL) 500 MG tablet Take 1 tablet (500 mg total) by mouth 2 (two) times daily. 14 tablet 0   Omega 3 1200 MG CAPS Take 2 capsules by mouth.     pantoprazole (PROTONIX) 20 MG tablet Take 1 tablet (20 mg total) by mouth daily. 90 tablet 3   spironolactone (ALDACTONE) 50 MG tablet Take 50 mg by mouth 2 (two) times daily.     tamoxifen (NOLVADEX) 20 MG tablet Take 1 tablet (20 mg total) by mouth daily. 90 tablet 6   zolpidem (AMBIEN) 5 MG tablet Take 0.5-1 tablets (2.5-5 mg total) by mouth at bedtime as needed for sleep. 90 tablet 1   No current facility-administered medications for this visit.     ALLERGIES: Amoxicillin and Sulfa antibiotics  Family History  Problem Relation Age of Onset   Arthritis Mother    Hypertension Mother    Hyperlipidemia Mother    Heart disease Mother 57       dec   Hypertension Father    Hyperlipidemia Father    Heart disease Father 88       smoked for 10 yrs., no muscle damage, 1 MI    Pulmonary fibrosis Father        diagnosed end stage, cause unkn.   Urolithiasis Father    Diverticulosis Sister    Breast cancer Sister 51   Heart disease Brother    Hyperlipidemia Brother    Obesity Brother    Heart attack Brother 73       Passed away at 62 of heart attack   Hyperlipidemia Brother    Hypertension Brother    Kidney Stones Brother    Anti-cardiolipin syndrome Brother    Arthritis Maternal Grandmother    Cervical cancer Maternal Grandmother        diagnosed in her 36s or 13s   Arthritis Paternal Grandmother    Diabetes Son    Asthma Son    Seizures Son    Asthma Son    Colon cancer Neg Hx    Colon polyps Neg Hx    Esophageal cancer Neg Hx    Rectal cancer Neg Hx    Stomach cancer Neg Hx     Social History   Socioeconomic History   Marital status: Married    Spouse name: Cheree Ditto   Number of children: 2  Years of education: Masters   Highest education level: Master's degree (e.g., MA, MS, MEng, MEd, MSW, MBA)   Occupational History   Occupation: Runner, broadcasting/film/video, reading specialist k-6    Comment: Retired  Tobacco Use   Smoking status: Never   Smokeless tobacco: Never  Vaping Use   Vaping Use: Never used  Substance and Sexual Activity   Alcohol use: Yes    Alcohol/week: 0.0 standard drinks of alcohol    Comment: 1 glass of wine/month   Drug use: No   Sexual activity: Not Currently    Partners: Male    Birth control/protection: Surgical    Comment: hysterectomy--still has ovaries  Other Topics Concern   Not on file  Social History Narrative   Relocated to [facility] from [place] Due to husband job transfer. 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 She lives with her husband. She has 2 grown sons- oldest recently graduated from college and took a job in [place].; youngest just graduated HS & ia attending .      Has had regular preventive care in [place]. At [facility] w/Dr. Criss Alvine. Only health concern is insomnia that developed about 5 years ago & has been treated with [place]. She has not practiced sleep hygiene.      Occasionally drinks sweet tea.    Social Determinants of Health   Financial Resource Strain: Low Risk  (04/20/2022)   Overall Financial Resource Strain (CARDIA)    Difficulty of Paying Living Expenses: Not hard at all  Food Insecurity: No Food Insecurity (04/20/2022)   Hunger Vital Sign    Worried About Running Out of Food in the Last Year: Never true    Ran Out of Food in the Last Year: Never true  Transportation Needs: No Transportation Needs (04/20/2022)   PRAPARE - Administrator, Civil Service (Medical): No    Lack of Transportation (Non-Medical): No  Physical Activity: Insufficiently Active (04/20/2022)   Exercise Vital Sign    Days of Exercise per Week: 3 days    Minutes of Exercise per Session: 30 min  Stress: Stress Concern Present (04/20/2022)   [employer] of Occupational  Health - Occupational Stress Questionnaire    Feeling of Stress : Very much  Social Connections: Moderately Isolated (04/20/2022)   Social Connection and Isolation Panel [NHANES]    Frequency of Communication with Friends and Family: More than three times a week    Frequency of Social Gatherings with Friends and Family: Once a week    Attends Religious Services: More than 4 times per year    Active Member of [employer] or Organizations: No    Attends Banker Meetings: Not on file    Marital Status: Widowed  Intimate Partner Violence: Not on file    ROS  PHYSICAL EXAMINATION:    LMP 07/30/2009 (Within Years)     General appearance: alert, cooperative and appears stated age  Pelvic: External genitalia:  The erythema and induration of the left buttock is markedly improved. Still healing, no active drainage.   Chaperone, Carolynn Serve, CAM was present for exam.  1. Cutaneous abscess of other site Much improved Complete the course of antibiotics Continue to do warm compresses and hot soaks Reach out if she doesn't continue to heal.

## 2023-02-10 ENCOUNTER — Encounter: Payer: Self-pay | Admitting: Family Medicine

## 2023-02-26 ENCOUNTER — Encounter: Payer: Self-pay | Admitting: Family Medicine

## 2023-02-26 ENCOUNTER — Ambulatory Visit: Payer: BC Managed Care – PPO | Admitting: Family Medicine

## 2023-02-26 VITALS — BP 104/69 | HR 64 | Temp 98.2°F | Wt 139.6 lb

## 2023-02-26 DIAGNOSIS — W57XXXA Bitten or stung by nonvenomous insect and other nonvenomous arthropods, initial encounter: Secondary | ICD-10-CM | POA: Diagnosis not present

## 2023-02-26 DIAGNOSIS — R21 Rash and other nonspecific skin eruption: Secondary | ICD-10-CM

## 2023-02-26 DIAGNOSIS — S40862A Insect bite (nonvenomous) of left upper arm, initial encounter: Secondary | ICD-10-CM

## 2023-02-26 MED ORDER — METHYLPREDNISOLONE ACETATE 80 MG/ML IJ SUSP
80.0000 mg | Freq: Once | INTRAMUSCULAR | Status: DC
Start: 2023-02-26 — End: 2023-02-26

## 2023-02-26 MED ORDER — PREDNISONE 20 MG PO TABS: ORAL_TABLET | ORAL | 0 refills | Status: DC

## 2023-02-26 MED ORDER — TRIAMCINOLONE ACETONIDE 0.1 % EX CREA: 1.0000 | TOPICAL_CREAM | Freq: Two times a day (BID) | CUTANEOUS | 0 refills | Status: DC

## 2023-02-26 MED ORDER — DOXYCYCLINE HYCLATE 100 MG PO TABS: 100.0000 mg | ORAL_TABLET | Freq: Two times a day (BID) | ORAL | 0 refills | Status: DC

## 2023-02-26 MED ORDER — METHYLPREDNISOLONE ACETATE 80 MG/ML IJ SUSP
80.0000 mg | Freq: Once | INTRAMUSCULAR | Status: AC
Start: 2023-02-26 — End: 2023-02-26
  Administered 2023-02-26: 80 mg via INTRAMUSCULAR

## 2023-02-26 NOTE — Patient Instructions (Addendum)
Return in about 2 weeks (around 03/12/2023), or if symptoms worsen or fail to improve.        Great to see you today.  I have refilled the medication(s) we provide.   If labs were collected or images ordered, we will inform you of  results once we have received them and reviewed. We will contact you either by echart message, or telephone call.  Please give ample time to the testing facility, and our office to run,  receive and review results. Please do not call inquiring of results, even if you can see them in your chart. We will contact you as soon as we are able. If it has been over 1 week since the test was completed, and you have not yet heard from [place], then please call us.    - echart message- for normal results that have been seen by the patient already.   - telephone call: abnormal results or if patient has not viewed results in their echart.  If a referral to a specialist was entered for you, please call us in 2 weeks if you have not heard from the specialist office to schedule.

## 2023-02-26 NOTE — Progress Notes (Signed)
[name] , Feb 09, 1961, 62 y.o., female MRN: [id number] Patient Care Team    Relationship Specialty Notifications Start End  Natalia Leatherwood, DO PCP - General Family Medicine  03/29/15   Patton Salles, MD Consulting Physician Obstetrics and Gynecology  10/29/19   Isabella Bowens (Inactive) Counselor Genetic Counselor  10/29/19   Napoleon Form, MD Consulting Physician Gastroenterology  04/13/20     Chief Complaint  Patient presents with   Rash    Left arm; right shoulder and hand; itches all over; started 07/19;       Subjective: [name] is a 62 y.o. Pt presents for an OV with complaints of itchy rash of > 1 week and worsening duration.  Associated symptoms include rash is spreading. She reports she was working out in the yard some, and pulling [employer] prior to onset. She has never had issues in the past with this plant.  Pt has tried hydrocortisone and benadryl to ease their symptoms.  She states the rash first presented on her left forearm and an oval appearing red rash that is extremely itchy.  She now has a similar presentation at the left elbow region.  She has small raised red papules over different locations of her left hand forearm and right arm. She denies any fevers, chills, joint pain, headache or myalgias.  She reports she had a tick present about 4 months ago in a different location on her thigh.  But she has not noticed any insect bites or ticks since.     02/26/2023    7:44 AM 11/23/2022    9:46 AM 04/24/2022    8:59 AM 11/07/2021    8:56 AM 05/02/2021    8:11 AM  Depression screen PHQ 2/9  Decreased Interest 0 0 1 0 0  Down, Depressed, Hopeless 0 0 0 0 0  PHQ - 2 Score 0 0 1 0 0  Altered sleeping   2  0  Tired, decreased energy   0  0  Change in appetite   0  0  Feeling bad or failure about yourself    0  0  Trouble concentrating   1  0  Moving slowly or fidgety/restless   0  0  Suicidal thoughts   0  0  PHQ-9 Score   4   0    Allergies  Allergen Reactions   Amoxicillin Hives   Sulfa Antibiotics Rash    Childhood rxn   Social History   Social History Narrative   Relocated to [place] from [place]. Due to husband job transfer. 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 She lives with her husband. She has 2 grown sons- oldest recently graduated from college and took a job in [place].; youngest just graduated HS & ia attending [employer].      Has had regular preventive care in [place]. At [facility] w/Dr. Criss Alvine. Only health concern is insomnia that developed about 5 years ago & has been treated with [place]. She has not practiced sleep hygiene.      Occasionally drinks sweet tea.    Past Medical History:  Diagnosis Date   Anemia    PAST HX- ON IRON    Arthritis    neck   Cataract    BEGINNING    COVID 02/2021   COVID-19 03/03/2021  COVID-19 virus infection 05/2019   Diverticulosis of colon    MILD   Elevated cholesterol with elevated triglycerides    Eustachian tube dysfunction, left 05/22/2018   Family history of breast cancer    Family history of cervical cancer    GERD (gastroesophageal reflux disease)    History of COVID-19 02/27/2021   History of rectocele    Insomnia    started ambien 5 ya.   Osteopenia    --left femur   Periodic heart flutter    SVD (spontaneous vaginal delivery)    x 2   Ulnar nerve compression 03/29/2015   Urinary incontinence    Past Surgical History:  Procedure Laterality Date   BLADDER SUSPENSION N/A 11/10/2013   Procedure: TRANSVAGINAL TAPE (TVT) PROCEDURE;  Surgeon: Jacqualin Combes de Gwenevere Ghazi, MD;  Location: [facility];  Service: Gynecology;  Laterality: N/A;   COLONOSCOPY  2011   [place] [facility]    CYSTOSCOPY N/A 11/10/2013   Procedure: CYSTOSCOPY;  Surgeon: Jacqualin Combes de Gwenevere Ghazi, MD;  Location: [facility];  Service: Gynecology;  Laterality: N/A;   IRRIGATION  AND DEBRIDEMENT SEBACEOUS CYST  03/2017   VAGINAL HYSTERECTOMY  11/11    Vag cuff, mild rectocele   WISDOM TOOTH EXTRACTION     Family History  Problem Relation Age of Onset   Arthritis Mother    Hypertension Mother    Hyperlipidemia Mother    Heart disease Mother 59       dec   Hypertension Father    Hyperlipidemia Father    Heart disease Father 48       smoked for 10 yrs., no muscle damage, 1 MI    Pulmonary fibrosis Father        diagnosed end stage, cause unkn.   Urolithiasis Father    Diverticulosis Sister    Breast cancer Sister 58   Heart disease Brother    Hyperlipidemia Brother    Obesity Brother    Heart attack Brother 76       Passed away at 54 of heart attack   Hyperlipidemia Brother    Hypertension Brother    Kidney Stones Brother    Anti-cardiolipin syndrome Brother    Arthritis Maternal Grandmother    Cervical cancer Maternal Grandmother        diagnosed in her 56s or 64s   Arthritis Paternal Grandmother    Diabetes Son    Asthma Son    Seizures Son    Asthma Son    Colon cancer Neg Hx    Colon polyps Neg Hx    Esophageal cancer Neg Hx    Rectal cancer Neg Hx    Stomach cancer Neg Hx    Allergies as of 02/26/2023       Reactions   Amoxicillin Hives   Sulfa Antibiotics Rash   Childhood rxn        Medication List        Accurate as of February 26, 2023  7:59 AM. If you have any questions, ask your nurse or doctor.          STOP taking these medications    doxycycline 100 MG capsule Commonly known as: VIBRAMYCIN Replaced by: doxycycline 100 MG tablet Stopped by: Felix Pacini   metroNIDAZOLE 500 MG tablet Commonly known as: FLAGYL Stopped by: Felix Pacini       TAKE these medications    atorvastatin 40 MG tablet Commonly known as: LIPITOR Take 1 tablet (40 mg  total) by mouth daily.   baclofen 10 MG tablet Commonly known as: LIORESAL Take 1 tablet (10 mg total) by mouth 2 (two) times daily.   Caltrate 600+D Plus Minerals  600-800 MG-UNIT Tabs Take by mouth.   clobetasol ointment 0.05 % Commonly known as: TEMOVATE Apply 1 Application topically 2 (two) times daily. Apply in a thin layer two times daily for 2 weeks as needed for a flare.  Place on the area at night twice a week for maintenance dosing.   doxycycline 100 MG tablet Commonly known as: VIBRA-TABS Take 1 tablet (100 mg total) by mouth 2 (two) times daily. Replaces: doxycycline 100 MG capsule Started by: Felix Pacini   fenofibrate 145 MG tablet Commonly known as: TRICOR Take 1 tablet (145 mg total) by mouth daily.   finasteride 1 MG tablet Commonly known as: PROPECIA Take 1 mg by mouth daily.   Fusion Plus Caps Take 1 capsule by mouth daily.   Omega 3 1200 MG Caps Take 2 capsules by mouth.   pantoprazole 20 MG tablet Commonly known as: PROTONIX Take 1 tablet (20 mg total) by mouth daily.   predniSONE 20 MG tablet Commonly known as: DELTASONE 60 mg x2d, 40 mg x3d, 20 mg x2d, 10 mg x2d Start taking on: February 27, 2023 Started by: Felix Pacini   spironolactone 50 MG tablet Commonly known as: ALDACTONE Take 50 mg by mouth 2 (two) times daily.   tamoxifen 20 MG tablet Commonly known as: NOLVADEX Take 1 tablet (20 mg total) by mouth daily.   triamcinolone cream 0.1 % Commonly known as: KENALOG Apply 1 Application topically 2 (two) times daily. Started by: Felix Pacini   zolpidem 5 MG tablet Commonly known as: AMBIEN Take 0.5-1 tablets (2.5-5 mg total) by mouth at bedtime as needed for sleep.        All past medical history, surgical history, allergies, family history, immunizations andmedications were updated in the EMR today and reviewed under the history and medication portions of their EMR.     ROS Negative, with the exception of above mentioned in HPI   Objective:  BP 104/69   Pulse 64   Temp 98.2 F (36.8 C)   Wt 139 lb 9.6 oz (63.3 kg)   LMP 07/30/2009 (Within Years)   SpO2 97%   BMI 24.73 kg/m  Body mass  index is 24.73 kg/m. Physical Exam Vitals and nursing note reviewed.  Constitutional:      General: She is not in acute distress.    Appearance: Normal appearance. She is normal weight. She is not ill-appearing or toxic-appearing.  HENT:     Head: Normocephalic and atraumatic.  Eyes:     General: No scleral icterus.       Right eye: No discharge.        Left eye: No discharge.     Extraocular Movements: Extraocular movements intact.     Conjunctiva/sclera: Conjunctivae normal.     Pupils: Pupils are equal, round, and reactive to light.  Skin:    General: Skin is warm.     Findings: Rash present.     Comments: Left forearm with 2 oval erythemic areas with ring surrounding.  Raised red maculopapular areas that are not grouped on left hand, forearm, right arm.  Neurological:     Mental Status: She is alert and oriented to person, place, and time. Mental status is at baseline.     Motor: No weakness.     Coordination: Coordination normal.  Gait: Gait normal.  Psychiatric:        Mood and Affect: Mood normal.        Behavior: Behavior normal.        Thought Content: Thought content normal.        Judgment: Judgment normal.     No results found. No results found. No results found for this or any previous visit (from the past 24 hour(s)).  Assessment/Plan: [name] is a 62 y.o. female present for OV for  Rash-possible tick bite of left upper arm Rash is oval red with ring surrounding similar to bull's-eye rash now in 2 locations on her left forearm.  Both have a small area of central scabbing consistent with insect bite. Elected to treat with steroid injection, followed by steroid taper tomorrow for total of 10 days of treatment. Also treat with doxycycline twice daily x 14 days while we await tickborne illness titers to return. Triamcinolone cream twice daily - methylPREDNISolone acetate (DEPO-MEDROL) injection 80 mg - B. burgdorfi antibodies - Rocky mtn spotted  fvr abs pnl(IgG+IgM) Patient was encouraged to continue to monitor and if worsening or new lesions arise, to follow-up immediately for further evaluation.  Reviewed expectations re: course of current medical issues. Discussed self-management of symptoms. Outlined signs and symptoms indicating need for more acute intervention. Patient verbalized understanding and all questions were answered. Patient received an After-Visit Summary.    Orders Placed This Encounter  Procedures   B. burgdorfi antibodies   Rocky mtn spotted fvr abs pnl(IgG+IgM)   Meds ordered this encounter  Medications   predniSONE (DELTASONE) 20 MG tablet    Sig: 60 mg x2d, 40 mg x3d, 20 mg x2d, 10 mg x2d    Dispense:  15 tablet    Refill:  0   methylPREDNISolone acetate (DEPO-MEDROL) injection 80 mg   triamcinolone cream (KENALOG) 0.1 %    Sig: Apply 1 Application topically 2 (two) times daily.    Dispense:  30 g    Refill:  0   doxycycline (VIBRA-TABS) 100 MG tablet    Sig: Take 1 tablet (100 mg total) by mouth 2 (two) times daily.    Dispense:  28 tablet    Refill:  0   Referral Orders  No referral(s) requested today     Note is dictated utilizing voice recognition software. Although note has been proof read prior to signing, occasional typographical errors still can be missed. If any questions arise, please do not hesitate to call for verification.   electronically signed by:  Felix Pacini, DO  [facility] [place]

## 2023-02-26 NOTE — Telephone Encounter (Signed)
No further action needed.

## 2023-04-25 NOTE — Progress Notes (Signed)
62 y.o. G2P2 Widowed Caucasian female here for annual exam.    Continuing to receive support for her grief.   Has gained 10 pounds.   Treated with I and D of abscess of buttock in June, 2024 by Dr. Oscar La. Tx with Doxycycline and Flagyl.  This has resolved.   Vulva is improved.   Using Clobetasol ointment 2 - 3 times a week at bedtime. Changed to an unscented pad.  No problems voiding.  No urinary incontinence.   Seeing oncology yearly for breast care.  She takes Tamoxifen for reducing risk of breast cancer.   PCP:   Dr. Claiborne Billings  Patient's last menstrual period was 07/30/2009 (within years).           Sexually active: No.  The current method of family planning is status post hysterectomy.    Exercising: Yes.     walking Smoker:  no  Health Maintenance: Pap:  2013 neg History of abnormal Pap:  no MMG:  08/09/22 Breast density Cat B, BI-RADS CAT 1 neg.  Scheduled for 08/13/23.  Colonoscopy:  12/03/19 - due in 2031 BMD:   08/03/20  Result  osteopenic TDaP:  11/23/22 Gardasil:   no HIV: 10/30/17 NR Hep C: 10/30/17 NR Screening Labs:  PCP Flu and Covid vaccine discussed.    reports that she has never smoked. She has never used smokeless tobacco. She reports current alcohol use. She reports that she does not use drugs.  Past Medical History:  Diagnosis Date   Anemia    PAST HX- ON IRON    Arthritis    neck   Cataract    BEGINNING    COVID 02/2021   COVID-19 03/03/2021   COVID-19 virus infection 05/2019   Diverticulosis of colon    MILD   Elevated cholesterol with elevated triglycerides    Eustachian tube dysfunction, left 05/22/2018   Family history of breast cancer    Family history of cervical cancer    GERD (gastroesophageal reflux disease)    History of COVID-19 02/27/2021   History of rectocele    Insomnia    started ambien 5 ya.   Osteopenia    --left femur   Periodic heart flutter    SVD (spontaneous vaginal delivery)    x 2   Ulnar nerve compression  03/29/2015   Urinary incontinence     Past Surgical History:  Procedure Laterality Date   BLADDER SUSPENSION N/A 11/10/2013   Procedure: TRANSVAGINAL TAPE (TVT) PROCEDURE;  Surgeon: Jacqualin Combes de Gwenevere Ghazi, MD;  Location: [facility];  Service: Gynecology;  Laterality: N/A;   COLONOSCOPY  2011   [place] [facility]    CYSTOSCOPY N/A 11/10/2013   Procedure: CYSTOSCOPY;  Surgeon: Jacqualin Combes de Gwenevere Ghazi, MD;  Location: [facility];  Service: Gynecology;  Laterality: N/A;   IRRIGATION AND DEBRIDEMENT SEBACEOUS CYST  03/2017   VAGINAL HYSTERECTOMY  11/11    Vag cuff, mild rectocele   WISDOM TOOTH EXTRACTION      Current Outpatient Medications  Medication Sig Dispense Refill   atorvastatin (LIPITOR) 40 MG tablet Take 1 tablet (40 mg total) by mouth daily. 90 tablet 3   baclofen (LIORESAL) 10 MG tablet Take 1 tablet (10 mg total) by mouth 2 (two) times daily. 180 each 1   Calcium Carbonate-Vit D-Min (CALTRATE 600+D PLUS MINERALS) 600-800 MG-UNIT TABS Take by mouth.     clobetasol ointment (TEMOVATE) 0.05 % Apply 1 Application topically 2 (two) times daily. Apply in a  thin layer two times daily for 2 weeks as needed for a flare.  Place on the area at night twice a week for maintenance dosing. 60 g 0   doxycycline (VIBRA-TABS) 100 MG tablet Take 1 tablet (100 mg total) by mouth 2 (two) times daily. 28 tablet 0   fenofibrate (TRICOR) 145 MG tablet Take 1 tablet (145 mg total) by mouth daily. 90 tablet 3   finasteride (PROPECIA) 1 MG tablet Take 1 mg by mouth daily.     Iron-FA-B Cmp-C-Biot-Probiotic (FUSION PLUS) CAPS Take 1 capsule by mouth daily.     Omega 3 1200 MG CAPS Take 2 capsules by mouth.     pantoprazole (PROTONIX) 20 MG tablet Take 1 tablet (20 mg total) by mouth daily. 90 tablet 3   spironolactone (ALDACTONE) 50 MG tablet Take 50 mg by mouth 2 (two) times daily.     tamoxifen (NOLVADEX) 20 MG tablet Take 1 tablet (20 mg total) by mouth daily. 90 tablet 6    triamcinolone cream (KENALOG) 0.1 % Apply 1 Application topically 2 (two) times daily. 30 g 0   zolpidem (AMBIEN) 5 MG tablet Take 0.5-1 tablets (2.5-5 mg total) by mouth at bedtime as needed for sleep. 90 tablet 1   No current facility-administered medications for this visit.    Family History  Problem Relation Age of Onset   Arthritis Mother    Hypertension Mother    Hyperlipidemia Mother    Heart disease Mother 87       dec   Hypertension Father    Hyperlipidemia Father    Heart disease Father 72       smoked for 10 yrs., no muscle damage, 1 MI    Pulmonary fibrosis Father        diagnosed end stage, cause unkn.   Urolithiasis Father    Diverticulosis Sister    Breast cancer Sister 31   Heart disease Brother    Hyperlipidemia Brother    Obesity Brother    Heart attack Brother 82       Passed away at 89 of heart attack   Hyperlipidemia Brother    Hypertension Brother    Kidney Stones Brother    Anti-cardiolipin syndrome Brother    Arthritis Maternal Grandmother    Cervical cancer Maternal Grandmother        diagnosed in her 61s or 83s   Arthritis Paternal Grandmother    Diabetes Son    Asthma Son    Seizures Son    Asthma Son    Colon cancer Neg Hx    Colon polyps Neg Hx    Esophageal cancer Neg Hx    Rectal cancer Neg Hx    Stomach cancer Neg Hx     Review of Systems  All other systems reviewed and are negative.   Exam:   BP 96/60 (BP Location: Left Arm, Patient Position: Sitting, Cuff Size: Normal)   Pulse 90   Ht 5\' 4"  (1.626 m)   Wt 144 lb (65.3 kg)   LMP 07/30/2009 (Within Years)   SpO2 95%   BMI 24.72 kg/m     General appearance: alert, cooperative and appears stated age Head: normocephalic, without obvious abnormality, atraumatic Neck: no adenopathy, supple, symmetrical, trachea midline and thyroid normal to inspection and palpation Lungs: clear to auscultation bilaterally Breasts: normal appearance, no masses or tenderness, No nipple retraction  or dimpling, No nipple discharge or bleeding, No axillary adenopathy Heart: regular rate and rhythm Abdomen: soft, non-tender; no  masses, no organomegaly Extremities: extremities normal, atraumatic, no cyanosis or edema Skin: skin color, texture, turgor normal. No rashes or lesions Lymph nodes: cervical, supraclavicular, and axillary nodes normal. Neurologic: grossly normal  Pelvic: External genitalia:  no lesions              No abnormal inguinal nodes palpated.              Urethra:  normal appearing urethra with no masses, tenderness or lesions              Bartholins and Skenes: normal                 Vagina: normal appearing vagina with normal color and discharge, no lesions.  First degree cystocele.              Cervix: Absent              Pap taken: no Bimanual Exam:  Uterus:  absent.               Adnexa: no mass, fullness, tenderness              Rectal exam: yes.  Confirms.              Anus:  normal sphincter tone, no lesions  Chaperone was present for exam:  Warren Lacy, CMA  Assessment:   Well woman visit with gynecologic exam. Menopausal female.  Status post TVH for prolapse.  Status post TVT.  Cystocele. Increased lifetime risk of breast cancer - 25% by TC model.  Sister tested  Negative, so genetic testing declined by patient.  On Tamoxifen.  Osteopenia. Very mild.  Chronic vulvitis.  Using Clobetasol.  Bereavement.   Plan: Mammogram screening discussed. Self breast awareness reviewed. Pap and HR HPV not indicated.  Guidelines for Calcium, Vitamin D, regular exercise program including cardiovascular and weight bearing exercise. Cystocele discussed.  She will call for increased prolapse, difficulty voiding, urinary incontinence, or UTIs. We discussed breast MRI for July, 2025.  Rx for Clobetasol.  Follow up annually and prn.

## 2023-05-09 ENCOUNTER — Ambulatory Visit (INDEPENDENT_AMBULATORY_CARE_PROVIDER_SITE_OTHER): Payer: BC Managed Care – PPO | Admitting: Obstetrics and Gynecology

## 2023-05-09 ENCOUNTER — Encounter: Payer: Self-pay | Admitting: Obstetrics and Gynecology

## 2023-05-09 ENCOUNTER — Telehealth: Payer: Self-pay | Admitting: Obstetrics and Gynecology

## 2023-05-09 VITALS — BP 96/60 | HR 90 | Ht 64.0 in | Wt 144.0 lb

## 2023-05-09 DIAGNOSIS — M8580 Other specified disorders of bone density and structure, unspecified site: Secondary | ICD-10-CM

## 2023-05-09 DIAGNOSIS — Z9189 Other specified personal risk factors, not elsewhere classified: Secondary | ICD-10-CM

## 2023-05-09 DIAGNOSIS — Z01419 Encounter for gynecological examination (general) (routine) without abnormal findings: Secondary | ICD-10-CM

## 2023-05-09 DIAGNOSIS — Z780 Asymptomatic menopausal state: Secondary | ICD-10-CM | POA: Diagnosis not present

## 2023-05-09 DIAGNOSIS — Z803 Family history of malignant neoplasm of breast: Secondary | ICD-10-CM

## 2023-05-09 MED ORDER — CLOBETASOL PROPIONATE 0.05 % EX OINT: 1.0000 | TOPICAL_OINTMENT | Freq: Two times a day (BID) | CUTANEOUS | 0 refills | Status: DC

## 2023-05-09 NOTE — Patient Instructions (Signed)

## 2023-05-09 NOTE — Telephone Encounter (Signed)
Please assist with scheduling breast MRI with contrast for my patient for July, 2025.   She has increased risk of breast cancer, 25% and is on tamoxifen for prevention of breast cancer.   She sees oncology also.

## 2023-05-09 NOTE — Telephone Encounter (Signed)
Future order placed.  Spoke with patient. D[facility] contact information provided for scheduling. Advised once imaging is scheduled, our office will determine if PA needed. Any questions regarding OOP cost should be directed to insurance provider or imaging center. Patient verbalizes understanding and is agreeable.   Routing to provider for final review. Patient is agreeable to disposition. Will close encounter.

## 2023-05-14 ENCOUNTER — Ambulatory Visit: Payer: BC Managed Care – PPO | Admitting: Family Medicine

## 2023-05-14 ENCOUNTER — Encounter: Payer: Self-pay | Admitting: Family Medicine

## 2023-05-14 VITALS — BP 118/68 | HR 83 | Temp 98.1°F | Wt 144.6 lb

## 2023-05-14 DIAGNOSIS — R7303 Prediabetes: Secondary | ICD-10-CM

## 2023-05-14 DIAGNOSIS — E559 Vitamin D deficiency, unspecified: Secondary | ICD-10-CM

## 2023-05-14 DIAGNOSIS — Z8249 Family history of ischemic heart disease and other diseases of the circulatory system: Secondary | ICD-10-CM

## 2023-05-14 DIAGNOSIS — R931 Abnormal findings on diagnostic imaging of heart and coronary circulation: Secondary | ICD-10-CM

## 2023-05-14 DIAGNOSIS — I700 Atherosclerosis of aorta: Secondary | ICD-10-CM

## 2023-05-14 DIAGNOSIS — Z23 Encounter for immunization: Secondary | ICD-10-CM | POA: Diagnosis not present

## 2023-05-14 DIAGNOSIS — M8589 Other specified disorders of bone density and structure, multiple sites: Secondary | ICD-10-CM | POA: Diagnosis not present

## 2023-05-14 DIAGNOSIS — G4700 Insomnia, unspecified: Secondary | ICD-10-CM | POA: Diagnosis not present

## 2023-05-14 DIAGNOSIS — E782 Mixed hyperlipidemia: Secondary | ICD-10-CM | POA: Diagnosis not present

## 2023-05-14 DIAGNOSIS — M545 Low back pain, unspecified: Secondary | ICD-10-CM

## 2023-05-14 DIAGNOSIS — Z79899 Other long term (current) drug therapy: Secondary | ICD-10-CM

## 2023-05-14 DIAGNOSIS — K219 Gastro-esophageal reflux disease without esophagitis: Secondary | ICD-10-CM

## 2023-05-14 MED ORDER — ZOLPIDEM TARTRATE 5 MG PO TABS: 2.5000 mg | ORAL_TABLET | Freq: Every evening | ORAL | 1 refills | Status: DC | PRN

## 2023-05-14 MED ORDER — BACLOFEN 10 MG PO TABS: 10.0000 mg | ORAL_TABLET | Freq: Two times a day (BID) | ORAL | 1 refills | Status: DC

## 2023-05-14 NOTE — Patient Instructions (Addendum)
Return in about 6 months (around 11/25/2023) for cpe (20 min), Routine chronic condition follow-up.        Great to see you today.  I have refilled the medication(s) we provide.   If labs were collected or images ordered, we will inform you of  results once we have received them and reviewed. We will contact you either by echart message, or telephone call.  Please give ample time to the testing facility, and our office to run,  receive and review results. Please do not call inquiring of results, even if you can see them in your chart. We will contact you as soon as we are able. If it has been over 1 week since the test was completed, and you have not yet heard from [place], then please call us.    - echart message- for normal results that have been seen by the patient already.   - telephone call: abnormal results or if patient has not viewed results in their echart.  If a referral to a specialist was entered for you, please call us in 2 weeks if you have not heard from the specialist office to schedule.

## 2023-05-14 NOTE — Progress Notes (Signed)
Patient ID: [name], female  DOB: 13-Apr-1961, 62 y.o.   MRN: [id number] Patient Care Team    Relationship Specialty Notifications Start End  Natalia Leatherwood, DO PCP - General Family Medicine  03/29/15   Patton Salles, MD Consulting Physician Obstetrics and Gynecology  10/29/19   Isabella Bowens (Inactive) Counselor Genetic Counselor  10/29/19   Napoleon Form, MD Consulting Physician Gastroenterology  04/13/20     Chief Complaint  Patient presents with   Insomnia    cmc    Subjective:  [name] is a 62 y.o.  Female  present for outine chronic condition management All past medical history, surgical history, allergies, family history, immunizations, medications and social history were updated in the electronic medical record today. All recent labs, ED visits and hospitalizations within the last year were reviewed.  Insomnia/neck pain Patient reports insomnia is well-controlled on Ambien 2.5-5 mg daily at bedtime. She has been on this medication since ~2012.  She reports she routinely needs this medication- sometimes she takes a half tab.. She denies negative side effects. She reports she has continued to use the baclofen BID for neck pain and feels it has been very helpful like to continue.   Mixed hyperlipidemia/ On statin therapy/Family history of premature coronary artery disease/ Patient reports she is compliance with atorvastatin 40 mg daily, fenofibrate 145 mg daily and she is taking 3600 mg of omega-3 supplement daily. LDL goal less than 70. CT cardiac scoring 02/07/2021: FINDINGS: Coronary Calcium Score: Left main: 0 Left anterior descending artery: 0.9 Left circumflex artery: 1.5 Right coronary artery: 0 Total: 2.4 Percentile: 63rd Pericardium: Normal. Ascending Aorta: Normal caliber. Non-cardiac: See separate report from [facility]. IMPRESSION: Coronary calcium score of 2.4. This was 63rd percentile for age-, race-, and  sex-matched controls.   Prediabetes She has continued exercising routinely and has been eating a healthier diet.  She is continue to slowly lose weight over time intentionally. Last few A1c normal     05/14/2023    8:36 AM 02/26/2023    7:44 AM 11/23/2022    9:46 AM 04/24/2022    8:59 AM 11/07/2021    8:56 AM  Depression screen PHQ 2/9  Decreased Interest 0 0 0 1 0  Down, Depressed, Hopeless 0 0 0 0 0  PHQ - 2 Score 0 0 0 1 0  Altered sleeping    2   Tired, decreased energy    0   Change in appetite    0   Feeling bad or failure about yourself     0   Trouble concentrating    1   Moving slowly or fidgety/restless    0   Suicidal thoughts    0   PHQ-9 Score    4       04/24/2022    9:00 AM 05/02/2021    8:11 AM  GAD 7 : Generalized Anxiety Score  Nervous, Anxious, on Edge 1 0  Control/stop worrying 0 0  Worry too much - different things 0 0  Trouble relaxing 1 0  Restless 0 0  Easily annoyed or irritable 1 0  Afraid - awful might happen 0   Total GAD 7 Score 3     Immunization History  Administered Date(s) Administered   Fluad Trivalent(High Dose 65+) 05/14/2023   Influenza Inj Mdck Quad Pf 04/18/2018   Influenza, Quadrivalent, Recombinant, Inj, Pf 04/30/2019   Influenza,inj,Quad PF,6+ Mos 05/17/2017, 04/13/2020, 05/02/2021,  04/24/2022   Influenza-Unspecified 06/20/2015, 05/25/2016, 04/30/2019   PFIZER Comirnaty(Gray Top)Covid-19 Tri-Sucrose Vaccine 05/03/2022   PFIZER(Purple Top)SARS-COV-2 Vaccination 10/03/2019, 10/24/2019, 06/18/2020   PPD Test 03/16/2014   Pfizer Covid-19 Vaccine Bivalent Booster 64yrs & up 06/12/2021   Respiratory Syncytial Virus Vaccine,Recomb Aduvanted(Arexvy) 05/03/2022   Tdap 04/03/2013, 11/23/2022   Zoster Recombinant(Shingrix) 10/30/2017, 01/27/2018     Past Medical History:  Diagnosis Date   Anemia    PAST HX- ON IRON    Arthritis    neck   Cataract    BEGINNING    COVID 02/2021   COVID-19 03/03/2021   COVID-19 virus infection  05/2019   Diverticulosis of colon    MILD   Elevated cholesterol with elevated triglycerides    Eustachian tube dysfunction, left 05/22/2018   Family history of breast cancer    Family history of cervical cancer    GERD (gastroesophageal reflux disease)    History of COVID-19 02/27/2021   History of rectocele    Insomnia    started ambien 5 ya.   Osteopenia    --left femur   Periodic heart flutter    SVD (spontaneous vaginal delivery)    x 2   Ulnar nerve compression 03/29/2015   Urinary incontinence    Allergies  Allergen Reactions   Amoxicillin Hives   Sulfa Antibiotics Rash    Childhood rxn   Past Surgical History:  Procedure Laterality Date   BLADDER SUSPENSION N/A 11/10/2013   Procedure: TRANSVAGINAL TAPE (TVT) PROCEDURE;  Surgeon: Jacqualin Combes de Gwenevere Ghazi, MD;  Location: [facility];  Service: Gynecology;  Laterality: N/A;   COLONOSCOPY  2011   [place] [facility]    CYSTOSCOPY N/A 11/10/2013   Procedure: CYSTOSCOPY;  Surgeon: Jacqualin Combes de Gwenevere Ghazi, MD;  Location: [facility];  Service: Gynecology;  Laterality: N/A;   IRRIGATION AND DEBRIDEMENT SEBACEOUS CYST  03/2017   VAGINAL HYSTERECTOMY  11/11    Vag cuff, mild rectocele   WISDOM TOOTH EXTRACTION     Family History  Problem Relation Age of Onset   Arthritis Mother    Hypertension Mother    Hyperlipidemia Mother    Heart disease Mother 67       dec   Hypertension Father    Hyperlipidemia Father    Heart disease Father 48       smoked for 10 yrs., no muscle damage, 1 MI    Pulmonary fibrosis Father        diagnosed end stage, cause unkn.   Urolithiasis Father    Diverticulosis Sister    Breast cancer Sister 9   Heart disease Brother    Hyperlipidemia Brother    Obesity Brother    Heart attack Brother 88       Passed away at 32 of heart attack   Hyperlipidemia Brother    Hypertension Brother    Kidney Stones Brother    Anti-cardiolipin syndrome Brother    Arthritis Maternal  Grandmother    Cervical cancer Maternal Grandmother        diagnosed in her 74s or 3s   Arthritis Paternal Grandmother    Diabetes Son    Asthma Son    Seizures Son    Asthma Son    Colon cancer Neg Hx    Colon polyps Neg Hx    Esophageal cancer Neg Hx    Rectal cancer Neg Hx    Stomach cancer Neg Hx    Social History   Social History Narrative  Relocated to [facility] from [place] Due to husband job transfer. 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 She lives with her husband. She has 2 grown sons- oldest recently graduated from college and took a job in [employer].; youngest just graduated HS & ia attending [employer].      Has had regular preventive care in [place]. At [facility] w/Dr. Criss Alvine. Only health concern is insomnia that developed about 5 years ago & has been treated with [place]. She has not practiced sleep hygiene.      Occasionally drinks sweet tea.     Allergies as of 05/14/2023       Reactions   Amoxicillin Hives   Sulfa Antibiotics Rash   Childhood rxn        Medication List        Accurate as of May 14, 2023  8:48 AM. If you have any questions, ask your nurse or doctor.          STOP taking these medications    doxycycline 100 MG tablet Commonly known as: VIBRA-TABS Stopped by: Felix Pacini       TAKE these medications    atorvastatin 40 MG tablet Commonly known as: LIPITOR Take 1 tablet (40 mg total) by mouth daily.   baclofen 10 MG tablet Commonly known as: LIORESAL Take 1 tablet (10 mg total) by mouth 2 (two) times daily.   Caltrate 600+D Plus Minerals 600-800 MG-UNIT Tabs Take by mouth.   clobetasol ointment 0.05 % Commonly known as: TEMOVATE Apply 1 Application topically 2 (two) times daily. Apply in a thin layer two times daily for 2 weeks as needed for a flare.  Place on the area at night twice a week for maintenance dosing.   cyanocobalamin 1000  MCG tablet Take 1,200 mcg by mouth daily.   fenofibrate 145 MG tablet Commonly known as: TRICOR Take 1 tablet (145 mg total) by mouth daily.   finasteride 1 MG tablet Commonly known as: PROPECIA Take 1 mg by mouth daily.   Fusion Plus Caps Take 1 capsule by mouth daily.   Omega 3 1200 MG Caps Take 2 capsules by mouth.   pantoprazole 20 MG tablet Commonly known as: PROTONIX Take 1 tablet (20 mg total) by mouth daily.   spironolactone 50 MG tablet Commonly known as: ALDACTONE Take 50 mg by mouth 2 (two) times daily.   tamoxifen 20 MG tablet Commonly known as: NOLVADEX Take 1 tablet (20 mg total) by mouth daily.   triamcinolone cream 0.1 % Commonly known as: KENALOG Apply 1 Application topically 2 (two) times daily.   zolpidem 5 MG tablet Commonly known as: AMBIEN Take 0.5-1 tablets (2.5-5 mg total) by mouth at bedtime as needed for sleep.        All past medical history, surgical history, allergies, family history, immunizations andmedications were updated in the EMR today and reviewed under the history and medication portions of their EMR.      ROS 14 pt review of systems performed and negative (unless mentioned in an HPI)  Objective BP 118/68   Pulse 83   Temp 98.1 F (36.7 C)   Wt 144 lb 9.6 oz (65.6 kg)   LMP 07/30/2009 (Within Years)   SpO2 98%   BMI 24.82 kg/m  Physical Exam Vitals and nursing note reviewed.  Constitutional:      General: She is not in acute distress.  Appearance: Normal appearance. She is not ill-appearing, toxic-appearing or diaphoretic.  HENT:     Head: Normocephalic and atraumatic.  Eyes:     General: No scleral icterus.       Right eye: No discharge.        Left eye: No discharge.     Extraocular Movements: Extraocular movements intact.     Conjunctiva/sclera: Conjunctivae normal.     Pupils: Pupils are equal, round, and reactive to light.  Cardiovascular:     Rate and Rhythm: Normal rate and regular rhythm.   Pulmonary:     Effort: Pulmonary effort is normal. No respiratory distress.     Breath sounds: Normal breath sounds. No wheezing, rhonchi or rales.  Musculoskeletal:     Right lower leg: No edema.     Left lower leg: No edema.  Skin:    General: Skin is warm.     Findings: No rash.  Neurological:     Mental Status: She is alert and oriented to person, place, and time. Mental status is at baseline.     Motor: No weakness.     Gait: Gait normal.  Psychiatric:        Mood and Affect: Mood normal.        Behavior: Behavior normal.        Thought Content: Thought content normal.        Judgment: Judgment normal.      No results found.  Assessment/plan: [name] is a 62 y.o. female present for Chronic Conditions/illness Management Gastroesophageal reflux disease without esophagitis/long term PPI Stable Continue Protonix. She takes QOD if possible and QD if flares . Labs up-to-date  Insomnia, unspecified type Stable Continue ambien 2.5-5 mg QHS today.  - [facility] database reviewed target organ damage - Contract signed.    Mixed hyperlipidemia/ On statin therapy/Family history of premature coronary artery disease/coronary calcium score 2.4/63rd percentile/aortic atherosclerosis Patient is in this 63rd percentile for cardiac calcium scoring with a family history.  2023 LDL goal less than 70 - She has made great dietary and exercise changes.  Continue Lipitor -Continue fish oil 3600 Continue fenofibrate Labs up-to-date    Prediabetes Has been well controlled with her exercise and dietary changes.  She continues to lose weight slowly. - monitor yearly as long as < 5.9 and fbg normal   Vitamin D deficiency/Osteopenia - taking her daily supplement Bone density UTD through GYN Vitamin D up-to-date   Iron deficiency: Taking daily iron.  Iron up-to-date   Return in about 6 months (around 11/25/2023) for cpe (20 min), Routine chronic condition follow-up.  Orders  Placed This Encounter  Procedures   Flu Vaccine Trivalent High Dose (Fluad)   Meds ordered this encounter  Medications   baclofen (LIORESAL) 10 MG tablet    Sig: Take 1 tablet (10 mg total) by mouth 2 (two) times daily.    Dispense:  180 each    Refill:  1   zolpidem (AMBIEN) 5 MG tablet    Sig: Take 0.5-1 tablets (2.5-5 mg total) by mouth at bedtime as needed for sleep.    Dispense:  90 tablet    Refill:  1   Referral Orders  No referral(s) requested today    Electronically signed by: Felix Pacini, DO [facility] [place]

## 2023-06-20 DIAGNOSIS — D2262 Melanocytic nevi of left upper limb, including shoulder: Secondary | ICD-10-CM | POA: Diagnosis not present

## 2023-06-20 DIAGNOSIS — D225 Melanocytic nevi of trunk: Secondary | ICD-10-CM | POA: Diagnosis not present

## 2023-06-20 DIAGNOSIS — D2261 Melanocytic nevi of right upper limb, including shoulder: Secondary | ICD-10-CM | POA: Diagnosis not present

## 2023-06-20 DIAGNOSIS — L821 Other seborrheic keratosis: Secondary | ICD-10-CM | POA: Diagnosis not present

## 2023-07-05 ENCOUNTER — Other Ambulatory Visit: Payer: Self-pay | Admitting: Obstetrics and Gynecology

## 2023-07-05 DIAGNOSIS — Z1231 Encounter for screening mammogram for malignant neoplasm of breast: Secondary | ICD-10-CM

## 2023-07-06 ENCOUNTER — Other Ambulatory Visit: Payer: Self-pay | Admitting: Hematology and Oncology

## 2023-07-15 ENCOUNTER — Other Ambulatory Visit: Payer: Self-pay | Admitting: Obstetrics and Gynecology

## 2023-07-15 NOTE — Telephone Encounter (Signed)
Med refill request: Clobetasol Ointment Last AEX: 05/09/2023-BS Next AEX: 05/13/2024 Last MMG (if hormonal med): n/a Refill authorized: rx pend.   Rx'd for chronic vulvitis, need f/u?

## 2023-08-01 ENCOUNTER — Ambulatory Visit: Payer: BC Managed Care – PPO | Admitting: Hematology and Oncology

## 2023-08-08 ENCOUNTER — Inpatient Hospital Stay: Payer: BC Managed Care – PPO | Attending: Hematology and Oncology | Admitting: Hematology and Oncology

## 2023-08-08 VITALS — BP 126/65 | HR 79 | Temp 97.8°F | Resp 16 | Wt 139.9 lb

## 2023-08-08 DIAGNOSIS — Z8261 Family history of arthritis: Secondary | ICD-10-CM | POA: Insufficient documentation

## 2023-08-08 DIAGNOSIS — Z90710 Acquired absence of both cervix and uterus: Secondary | ICD-10-CM | POA: Insufficient documentation

## 2023-08-08 DIAGNOSIS — Z820 Family history of epilepsy and other diseases of the nervous system: Secondary | ICD-10-CM | POA: Insufficient documentation

## 2023-08-08 DIAGNOSIS — Z9189 Other specified personal risk factors, not elsewhere classified: Secondary | ICD-10-CM | POA: Diagnosis not present

## 2023-08-08 DIAGNOSIS — Z8249 Family history of ischemic heart disease and other diseases of the circulatory system: Secondary | ICD-10-CM | POA: Insufficient documentation

## 2023-08-08 DIAGNOSIS — Z79899 Other long term (current) drug therapy: Secondary | ICD-10-CM | POA: Insufficient documentation

## 2023-08-08 DIAGNOSIS — Z8616 Personal history of COVID-19: Secondary | ICD-10-CM | POA: Insufficient documentation

## 2023-08-08 DIAGNOSIS — Z79810 Long term (current) use of selective estrogen receptor modulators (SERMs): Secondary | ICD-10-CM | POA: Insufficient documentation

## 2023-08-08 DIAGNOSIS — Z825 Family history of asthma and other chronic lower respiratory diseases: Secondary | ICD-10-CM | POA: Insufficient documentation

## 2023-08-08 DIAGNOSIS — K5900 Constipation, unspecified: Secondary | ICD-10-CM | POA: Insufficient documentation

## 2023-08-08 DIAGNOSIS — K219 Gastro-esophageal reflux disease without esophagitis: Secondary | ICD-10-CM | POA: Insufficient documentation

## 2023-08-08 DIAGNOSIS — Z83438 Family history of other disorder of lipoprotein metabolism and other lipidemia: Secondary | ICD-10-CM | POA: Insufficient documentation

## 2023-08-08 DIAGNOSIS — Z803 Family history of malignant neoplasm of breast: Secondary | ICD-10-CM | POA: Insufficient documentation

## 2023-08-08 DIAGNOSIS — N951 Menopausal and female climacteric states: Secondary | ICD-10-CM | POA: Insufficient documentation

## 2023-08-08 DIAGNOSIS — Z1501 Genetic susceptibility to malignant neoplasm of breast: Secondary | ICD-10-CM | POA: Insufficient documentation

## 2023-08-08 DIAGNOSIS — Z8049 Family history of malignant neoplasm of other genital organs: Secondary | ICD-10-CM | POA: Insufficient documentation

## 2023-08-08 DIAGNOSIS — Z8349 Family history of other endocrine, nutritional and metabolic diseases: Secondary | ICD-10-CM | POA: Insufficient documentation

## 2023-08-08 DIAGNOSIS — E782 Mixed hyperlipidemia: Secondary | ICD-10-CM | POA: Insufficient documentation

## 2023-08-08 DIAGNOSIS — G4700 Insomnia, unspecified: Secondary | ICD-10-CM | POA: Insufficient documentation

## 2023-08-08 DIAGNOSIS — Z8379 Family history of other diseases of the digestive system: Secondary | ICD-10-CM | POA: Insufficient documentation

## 2023-08-08 DIAGNOSIS — Z880 Allergy status to penicillin: Secondary | ICD-10-CM | POA: Insufficient documentation

## 2023-08-08 DIAGNOSIS — Z882 Allergy status to sulfonamides status: Secondary | ICD-10-CM | POA: Insufficient documentation

## 2023-08-08 DIAGNOSIS — Z841 Family history of disorders of kidney and ureter: Secondary | ICD-10-CM | POA: Insufficient documentation

## 2023-08-08 DIAGNOSIS — Z833 Family history of diabetes mellitus: Secondary | ICD-10-CM | POA: Insufficient documentation

## 2023-08-08 NOTE — Progress Notes (Signed)
 [facility]  [phone]) [phone] [phone]     ID: [name] DOB: Sep 23, 1960  MR#: [id number]  RDW#:[id number]  Patient Care Team: [name] LABOR, DO as PCP - General (Family Medicine) Janice JAYSON [name] FORBES, Horton as Consulting Physician (Obstetrics and Gynecology) [name] (Inactive) as Horton (Genetic Horton) Nandigam, Kavitha Horton, Horton as Consulting Physician (Gastroenterology) Janice Stalls, Horton  CHIEF COMPLAINT: Breast cancer high risk  CURRENT TREATMENT: Tamoxifen   INTERVAL HISTORY:  Discussed the use of AI scribe software for clinical note transcription with the patient, who gave verbal consent to proceed.  History of Present Illness    The patient, with a history of breast cancer, presents for a routine follow-up. She reports adherence to daily tamoxifen  and denies any new health changes. She has a mammogram scheduled for next week. She also reports dry skin in the genital area, which she attributes to menopause. She has been using triamcinolone  and clobetasol  as prescribed by her dermatologist. She expresses hesitation about using estrogen cream due to concerns about systemic absorption, despite reassurances from her dermatologist. She also reports digestive issues, which she believes started after her husband's death. She has stopped taking iron supplements due to constipation.  Rest of the pertinent 10 point ROS reviewed and negative  COVID 19 VACCINATION STATUS: Pfizer x3, last 05/2020; infection 02/2021   HISTORY OF CURRENT ILLNESS: From the original intake note:  [name] [name] has a high risk for breast cancer. Her sister was diagnosed with triple negative breast cancer in 2020 at age 22. [name] on 05/12/2019 but opted not to pursue testing at that time. She instead opted to wait for her sister to undergo genetic testing first. Her sisters results were negative, thus additional testing  was not pursued.  When the patient met with Dr. Nikki most recently in September 2021 a TC score was calculated predicting a 25% lifetime risk of breast cancer.  Dr. Nikki has discussed mammogram, breast self-awareness, bone and exercise guidelines, and the possibility of MRIs for breast cancer surveillance.  She was referred here for further discussion.  The patient's subsequent history is as detailed below.   PAST MEDICAL HISTORY: Past Medical History:  Diagnosis Date   Anemia    PAST HX- ON IRON    Arthritis    neck   Cataract    BEGINNING    COVID 02/2021   COVID-19 03/03/2021   COVID-19 virus infection 05/2019   Diverticulosis of colon    MILD   Elevated cholesterol with elevated triglycerides    Eustachian tube dysfunction, left 05/22/2018   Family history of breast cancer    Family history of cervical cancer    GERD (gastroesophageal reflux disease)    History of COVID-19 02/27/2021   History of rectocele    Insomnia    started ambien  5 ya.   Osteopenia    --left femur   Periodic heart flutter    SVD (spontaneous vaginal delivery)    x 2   Ulnar nerve compression 03/29/2015   Urinary incontinence     PAST SURGICAL HISTORY: Past Surgical History:  Procedure Laterality Date   BLADDER SUSPENSION N/A 11/10/2013   Procedure: TRANSVAGINAL TAPE (TVT) PROCEDURE;  Surgeon: [name] [name] [name];  Location: [facility];  Service: Gynecology;  Laterality: N/A;   COLONOSCOPY  2011   [place] [facility]    CYSTOSCOPY N/A 11/10/2013   Procedure: CYSTOSCOPY;  Surgeon: [name]  [name] de [name] [name];  Location: [facility];  Service: Gynecology;  Laterality: N/A;   IRRIGATION AND DEBRIDEMENT SEBACEOUS CYST  03/2017   VAGINAL HYSTERECTOMY  11/11    Vag cuff, mild rectocele   WISDOM TOOTH EXTRACTION      FAMILY HISTORY: Family History  Problem Relation Age of Onset   Arthritis Mother    Hypertension Mother    Hyperlipidemia Mother    Heart disease Mother  23       dec   Hypertension Father    Hyperlipidemia Father    Heart disease Father 48       smoked for 10 yrs., no muscle damage, 1 MI    Pulmonary fibrosis Father        diagnosed end stage, cause unkn.   Urolithiasis Father    Diverticulosis Sister    Breast cancer Sister 89   Heart disease Brother    Hyperlipidemia Brother    Obesity Brother    Heart attack Brother 54       Passed away at 93 of heart attack   Hyperlipidemia Brother    Hypertension Brother    Kidney Stones Brother    Anti-cardiolipin syndrome Brother    Arthritis Maternal Grandmother    Cervical cancer Maternal Grandmother        diagnosed in her 80s or 86s   Arthritis Paternal Grandmother    Diabetes Son    Asthma Son    Seizures Son    Asthma Son    Colon cancer Neg Hx    Colon polyps Neg Hx    Esophageal cancer Neg Hx    Rectal cancer Neg Hx    Stomach cancer Neg Hx   From the genetics assessment note 05/12/2019:   [name] has two sons, one who is 6 and one who is 24. She has two brothers who are 49 and 73, and a sister who is 63 and was recently diagnosed with triple negative breast cancer. This sister has not yet had genetic testing for her cancer, although she is interested. Of note, the sister has also had a hysterectomy and had her ovaries removed at age 65.    [name]'s mother died at the age of 73 and did not have cancer. Her mother did have a hysterectomy at age 35 due to fibroids. [name] has one maternal aunt who is 64, and two cousins who have not had cancer. Her maternal grandmother died at age 58 and had cervical cancer in her 3s or 69s. [name] maternal grandfather died at age 3 and did not have cancer.   [name] father died at age 82 and did not have cancer. She did not have any paternal aunts or uncles. Her paternal grandmother died at age 38, and her grandfather died at age 73. Both of these individuals had Alzheimers, but neither had cancer.    [name] is unaware of  previous family history of genetic testing for hereditary cancer risks. There is no reported Ashkenazi Jewish ancestry. There is no known consanguinity.    GYNECOLOGIC HISTORY:  Patient's last menstrual period was 07/30/2009 (within years). Menarche: 63 years old Age at first live birth: 63 years old GX P 2 LMP 48 Contraceptive: Used oral contraceptives for many years with no complications HRT no  Hysterectomy? Yes, 2011 BSO?  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  Her husband Arlyss works  as a counsellor for a furniture company in [place] .  Son Darlyn lives in [place]  and works as a emergency planning/management officer son Blair lives in [place] and works for [employer].  Both sons got married this fall.   ADVANCED DIRECTIVES: In the absence of any documentation to the contrary, the patient's spouse is their HCPOA.    HEALTH MAINTENANCE: Social History   Tobacco Use   Smoking status: Never   Smokeless tobacco: Never  Vaping Use   Vaping status: Never Used  Substance Use Topics   Alcohol use: Yes    Alcohol/week: 0.0 standard drinks of alcohol    Comment: 1 glass of wine/month   Drug use: No     Colonoscopy: 11/2019, repeat due 2031  PAP: 2013, negative  Bone density: 07/2020, -1.1   Allergies  Allergen Reactions   Amoxicillin Hives   Sulfa Antibiotics Rash    Childhood rxn    Current Outpatient Medications  Medication Sig Dispense Refill   atorvastatin  (LIPITOR) 40 MG tablet Take 1 tablet (40 mg total) by mouth daily. 90 tablet 3   baclofen  (LIORESAL ) 10 MG tablet Take 1 tablet (10 mg total) by mouth 2 (two) times daily. 180 each 1   Calcium  Carbonate-Vit D-Min (CALTRATE 600+D PLUS MINERALS) 600-800 MG-UNIT TABS Take by mouth.     clobetasol  ointment (TEMOVATE ) 0.05 % Apply topically sparingly to affected area twice a day for 2 weeks if needed for a flare of increased symptoms.  Apply topically sparingly at  night twice a week for maintenance dosing. 60 g 0   cyanocobalamin  1000 MCG tablet Take 1,200 mcg by mouth daily.     fenofibrate  (TRICOR ) 145 MG tablet Take 1 tablet (145 mg total) by mouth daily. 90 tablet 3   finasteride (PROPECIA) 1 MG tablet Take 1 mg by mouth daily.     Iron-FA-B Cmp-C-Biot-Probiotic (FUSION PLUS) CAPS Take 1 capsule by mouth daily. (Patient not taking: Reported on 05/14/2023)     Omega 3 1200 MG CAPS Take 2 capsules by mouth.     pantoprazole  (PROTONIX ) 20 MG tablet Take 1 tablet (20 mg total) by mouth daily. 90 tablet 3   spironolactone (ALDACTONE) 50 MG tablet Take 50 mg by mouth 2 (two) times daily.     tamoxifen  (NOLVADEX ) 20 MG tablet TAKE 1 TABLET BY MOUTH EVERY DAY 90 tablet 6   triamcinolone  cream (KENALOG ) 0.1 % Apply 1 Application topically 2 (two) times daily. (Patient not taking: Reported on 05/14/2023) 30 g 0   zolpidem  (AMBIEN ) 5 MG tablet Take 0.5-1 tablets (2.5-5 mg total) by mouth at bedtime as needed for sleep. 90 tablet 1   No current facility-administered medications for this visit.    OBJECTIVE: [place] woman who appears with Vitals:   08/08/23 0937  BP: 126/65  Pulse: 79  Resp: 16  Temp: 97.8 F (36.6 C)  SpO2: 100%      Body mass index is 24.01 kg/m.   Wt Readings from Last 3 Encounters:  08/08/23 139 lb 14.4 oz (63.5 kg)  05/14/23 144 lb 9.6 oz (65.6 kg)  05/09/23 144 lb (65.3 kg)      ECOG FS:1 - Symptomatic but completely ambulatory  Physical Exam Constitutional:      Appearance: Normal appearance.  Chest:     Comments: Bilateral breasts inspected.  No palpable masses in both breast.  No regional adenopathy. Musculoskeletal:        General: No swelling.     Cervical back: Normal range  of motion and neck supple. No rigidity.  Lymphadenopathy:     Cervical: No cervical adenopathy.  Neurological:     Mental Status: She is alert.       LAB RESULTS:  CMP     Component Value Date/Time   NA 139 11/23/2022 0845   K 4.3  11/23/2022 0845   CL 103 11/23/2022 0845   CO2 29 11/23/2022 0845   GLUCOSE 84 11/23/2022 0845   BUN 31 (H) 11/23/2022 0845   CREATININE 1.03 11/23/2022 0845   CREATININE 1.12 (H) 05/19/2020 1521   CREATININE 0.86 01/26/2013 0812   CALCIUM  9.9 11/23/2022 0845   PROT 6.4 11/23/2022 0845   ALBUMIN 4.3 11/23/2022 0845   AST 15 11/23/2022 0845   AST 17 05/19/2020 1521   ALT 12 11/23/2022 0845   ALT 23 05/19/2020 1521   ALKPHOS 26 (L) 11/23/2022 0845   BILITOT 0.5 11/23/2022 0845   BILITOT 0.4 05/19/2020 1521   GFRNONAA 56 (L) 05/19/2020 1521   GFRAA >90 11/11/2013 0530    No results found for: TOTALPROTELP, ALBUMINELP, A1GS, A2GS, BETS, BETA2SER, GAMS, MSPIKE, SPEI  Lab Results  Component Value Date   WBC 5.1 11/23/2022   NEUTROABS 2.9 11/23/2022   HGB 13.3 11/23/2022   HCT 40.1 11/23/2022   MCV 95.9 11/23/2022   PLT 312.0 11/23/2022    No results found for: LABCA2  No components found for: [id number]  No results for input(s): INR in the last 168 hours.  No results found for: LABCA2  No results found for: RJW800  No results found for: CAN125  No results found for: CAN153  No results found for: CA2729  No components found for: HGQUANT  No results found for: CEA1, CEA / No results found for: CEA1, CEA   No results found for: AFPTUMOR  No results found for: CHROMOGRNA  No results found for: KPAFRELGTCHN, LAMBDASER, KAPLAMBRATIO (kappa/lambda light chains)  No results found for: HGBA, HGBA2QUANT, HGBFQUANT, HGBSQUAN (Hemoglobinopathy evaluation)   No results found for: LDH  Lab Results  Component Value Date   IRON 104 11/23/2022   TIBC 365 11/23/2022   IRONPCTSAT 28 11/23/2022   (Iron and TIBC)  Lab Results  Component Value Date   FERRITIN 211 11/23/2022    Urinalysis    Component Value Date/Time   COLORURINE YELLOW 11/10/2013 0615   APPEARANCEUR CLEAR 11/10/2013 0615   LABSPEC  1.025 11/10/2013 0615   PHURINE 6.0 11/10/2013 0615   GLUCOSEU NEGATIVE 11/10/2013 0615   HGBUR SMALL (A) 11/10/2013 0615   BILIRUBINUR n 08/29/2016 0834   KETONESUR NEGATIVE 11/10/2013 0615   PROTEINUR n 08/29/2016 0834   PROTEINUR NEGATIVE 11/10/2013 0615   UROBILINOGEN negative 08/29/2016 0834   UROBILINOGEN 0.2 11/10/2013 0615   NITRITE n 08/29/2016 0834   NITRITE NEGATIVE 11/10/2013 0615   LEUKOCYTESUR Negative 08/29/2016 0834    STUDIES: No results found.   ELIGIBLE FOR AVAILABLE RESEARCH PROTOCOL: no  ASSESSMENT: 63 y.o. [facility] woman with a high Tyler Cusick score predicting a lifetime breast cancer risk in the 25% range.  (1) risk reduction: Tamoxifen  started October 2021  (2) opted against intensified screening   PLAN:  Breast Cancer Prevention On Tamoxifen  for prevention of breast cancer recurrence. No new symptoms or physical exam findings. Mammogram scheduled for 08/13/2023. -Continue Tamoxifen . -Review mammogram results when available.  Menopausal Symptoms Dry skin in the perineal area due to menopause. Discussed the use of topical estrogen cream and its safety profile. -Consider use of topical estrogen  cream as needed.  General Health Maintenance No changes in health status. No new medications. Cyanocobalamin  (B12) and iron supplementation discussed. -Continue current medications and supplements as directed. -Remove completed antibiotic course from medication list. -Remove iron supplementation due to digestive issues.  Total encounter time 30 minutes.*   *Total Encounter Time as defined by the Centers for Medicare and Medicaid Services includes, in addition to the face-to-face time of a patient visit (documented in the note above) non-face-to-face time: obtaining and reviewing outside history, ordering and reviewing medications, tests or procedures, care coordination (communications with other health care professionals or caregivers) and documentation  in the medical record.

## 2023-08-13 ENCOUNTER — Ambulatory Visit
Admission: RE | Admit: 2023-08-13 | Discharge: 2023-08-13 | Disposition: A | Discharge status: Home / Self Care | Payer: BC Managed Care – PPO | Source: Ambulatory Visit

## 2023-08-13 DIAGNOSIS — Z1231 Encounter for screening mammogram for malignant neoplasm of breast: Secondary | ICD-10-CM

## 2023-11-04 ENCOUNTER — Other Ambulatory Visit: Payer: Self-pay | Admitting: Obstetrics and Gynecology

## 2023-11-04 DIAGNOSIS — N763 Subacute and chronic vulvitis: Secondary | ICD-10-CM

## 2023-11-05 ENCOUNTER — Encounter: Payer: Self-pay | Admitting: Obstetrics and Gynecology

## 2023-11-05 NOTE — Telephone Encounter (Signed)
 Pt is using clobetasol now for vulvitis, right?   This rx was discontinued from pt's med list in 12/2022.

## 2023-11-06 MED ORDER — CLOBETASOL PROPIONATE 0.05 % EX OINT: TOPICAL_OINTMENT | CUTANEOUS | 0 refills | Status: DC

## 2023-11-06 NOTE — Telephone Encounter (Signed)
 Med refill request:clobetasol ointment 0.05 % Last AEX: 05/09/23 -BS Next AEX: 05/13/24 -BS Last MMG (if hormonal med) N/A  Chronic Vulvitis  See patient message regarding smaller tube for travel.   Refill authorized: Please Advise?

## 2023-11-13 ENCOUNTER — Encounter: Payer: Self-pay | Admitting: Family Medicine

## 2023-11-13 ENCOUNTER — Ambulatory Visit (INDEPENDENT_AMBULATORY_CARE_PROVIDER_SITE_OTHER): Payer: BC Managed Care – PPO | Admitting: Family Medicine

## 2023-11-13 VITALS — BP 114/72 | HR 82 | Temp 98.3°F | Ht 64.0 in | Wt 146.4 lb

## 2023-11-13 DIAGNOSIS — Z0000 Encounter for general adult medical examination without abnormal findings: Secondary | ICD-10-CM

## 2023-11-13 DIAGNOSIS — E611 Iron deficiency: Secondary | ICD-10-CM | POA: Diagnosis not present

## 2023-11-13 DIAGNOSIS — E538 Deficiency of other specified B group vitamins: Secondary | ICD-10-CM

## 2023-11-13 DIAGNOSIS — I700 Atherosclerosis of aorta: Secondary | ICD-10-CM

## 2023-11-13 DIAGNOSIS — E559 Vitamin D deficiency, unspecified: Secondary | ICD-10-CM

## 2023-11-13 DIAGNOSIS — R7303 Prediabetes: Secondary | ICD-10-CM

## 2023-11-13 DIAGNOSIS — R931 Abnormal findings on diagnostic imaging of heart and coronary circulation: Secondary | ICD-10-CM

## 2023-11-13 DIAGNOSIS — Z79899 Other long term (current) drug therapy: Secondary | ICD-10-CM

## 2023-11-13 DIAGNOSIS — M8589 Other specified disorders of bone density and structure, multiple sites: Secondary | ICD-10-CM | POA: Diagnosis not present

## 2023-11-13 DIAGNOSIS — G4700 Insomnia, unspecified: Secondary | ICD-10-CM

## 2023-11-13 DIAGNOSIS — E782 Mixed hyperlipidemia: Secondary | ICD-10-CM

## 2023-11-13 DIAGNOSIS — K219 Gastro-esophageal reflux disease without esophagitis: Secondary | ICD-10-CM

## 2023-11-13 LAB — COMPREHENSIVE METABOLIC PANEL WITH GFR
ALT: 12 U/L (ref 0–35)
AST: 15 U/L (ref 0–37)
Albumin: 4.7 g/dL (ref 3.5–5.2)
Alkaline Phosphatase: 33 U/L — ABNORMAL LOW (ref 39–117)
BUN: 26 mg/dL — ABNORMAL HIGH (ref 6–23)
CO2: 27 meq/L (ref 19–32)
Calcium: 10.1 mg/dL (ref 8.4–10.5)
Chloride: 104 meq/L (ref 96–112)
Creatinine, Ser: 1.07 mg/dL (ref 0.40–1.20)
GFR: 54.92 mL/min — ABNORMAL LOW (ref >60.00)
Glucose, Bld: 96 mg/dL (ref 70–99)
Potassium: 4.3 meq/L (ref 3.5–5.1)
Sodium: 140 meq/L (ref 135–145)
Total Bilirubin: 0.6 mg/dL (ref 0.2–1.2)
Total Protein: 6.6 g/dL (ref 6.0–8.3)

## 2023-11-13 LAB — B12 AND FOLATE PANEL
Folate: 11.6 ng/mL (ref >5.9)
Vitamin B-12: 645 pg/mL (ref 211–911)

## 2023-11-13 LAB — CBC
HCT: 41.9 % (ref 36.0–46.0)
Hemoglobin: 14 g/dL (ref 12.0–15.0)
MCHC: 33.5 g/dL (ref 30.0–36.0)
MCV: 96.5 fl (ref 78.0–100.0)
Platelets: 309 10*3/uL (ref 150.0–400.0)
RBC: 4.34 Mil/uL (ref 3.87–5.11)
RDW: 13.3 % (ref 11.5–15.5)
WBC: 6.4 10*3/uL (ref 4.0–10.5)

## 2023-11-13 LAB — IBC + FERRITIN
Ferritin: 198.7 ng/mL (ref 10.0–291.0)
Iron: 141 ug/dL (ref 42–145)
Saturation Ratios: 33.6 % (ref 20.0–50.0)
TIBC: 420 ug/dL (ref 250.0–450.0)
Transferrin: 300 mg/dL (ref 212.0–360.0)

## 2023-11-13 LAB — LIPID PANEL
Cholesterol: 123 mg/dL (ref 0–200)
HDL: 44.4 mg/dL (ref >39.00)
LDL Cholesterol: 54 mg/dL (ref 0–99)
NonHDL: 78.45
Total CHOL/HDL Ratio: 3
Triglycerides: 120 mg/dL (ref 0.0–149.0)
VLDL: 24 mg/dL (ref 0.0–40.0)

## 2023-11-13 LAB — TSH: TSH: 2.18 u[IU]/mL (ref 0.35–5.50)

## 2023-11-13 LAB — HEMOGLOBIN A1C: Hgb A1c MFr Bld: 5.7 % (ref 4.6–6.5)

## 2023-11-13 LAB — VITAMIN D 25 HYDROXY (VIT D DEFICIENCY, FRACTURES): VITD: 36.8 ng/mL (ref 30.00–100.00)

## 2023-11-13 MED ORDER — CYCLOBENZAPRINE HCL 10 MG PO TABS: 10.0000 mg | ORAL_TABLET | Freq: Three times a day (TID) | ORAL | 0 refills | Status: DC | PRN

## 2023-11-13 MED ORDER — ATORVASTATIN CALCIUM 40 MG PO TABS: 40.0000 mg | ORAL_TABLET | Freq: Every day | ORAL | 3 refills | Status: AC

## 2023-11-13 MED ORDER — PANTOPRAZOLE SODIUM 20 MG PO TBEC: 20.0000 mg | DELAYED_RELEASE_TABLET | Freq: Every day | ORAL | 3 refills | Status: AC

## 2023-11-13 MED ORDER — ZOLPIDEM TARTRATE 5 MG PO TABS: 2.5000 mg | ORAL_TABLET | Freq: Every evening | ORAL | 1 refills | Status: DC | PRN

## 2023-11-13 MED ORDER — BACLOFEN 10 MG PO TABS: 10.0000 mg | ORAL_TABLET | Freq: Two times a day (BID) | ORAL | 1 refills | Status: DC

## 2023-11-13 MED ORDER — FENOFIBRATE 134 MG PO CAPS: 134.0000 mg | ORAL_CAPSULE | Freq: Every day | ORAL | 11 refills | Status: AC

## 2023-11-13 NOTE — Patient Instructions (Addendum)
 Return in about 24 weeks (around 04/29/2024) for Routine chronic condition follow-up.        Great to see you today.  I have refilled the medication(s) we provide.   If labs were collected or images ordered, we will inform you of  results once we have received them and reviewed. We will contact you either by echart message, or telephone call.  Please give ample time to the testing facility, and our office to run,  receive and review results. Please do not call inquiring of results, even if you can see them in your chart. We will contact you as soon as we are able. If it has been over 1 week since the test was completed, and you have not yet heard from us , then please call us .    - echart message- for normal results that have been seen by the patient already.   - telephone call: abnormal results or if patient has not viewed results in their echart.  If a referral to a specialist was entered for you, please call us  in 2 weeks if you have not heard from the specialist office to schedule.

## 2023-11-13 NOTE — Progress Notes (Signed)
 Patient ID: [name], female  DOB: 11/14/1960, 63 y.o.   MRN: [id number] Patient Care Team    Relationship Specialty Notifications Start End  Natalia Leatherwood, DO PCP - General Family Medicine  03/29/15   Patton Salles, MD Consulting Physician Obstetrics and Gynecology  10/29/19   Isabella Bowens (Inactive) Counselor Genetic Counselor  10/29/19   Napoleon Form, MD Consulting Physician Gastroenterology  04/13/20     Chief Complaint  Patient presents with   Annual Exam    Chronic Conditions/illness Management Pt is fasting.     Subjective:  [name] is a 63 y.o.  Female  present for  CPE and routine chronic condition management All past medical history, surgical history, allergies, family history, immunizations, medications and social history were updated in the electronic medical record today. All recent labs, ED visits and hospitalizations within the last year were reviewed.  Health maintenance:  Colonoscopy: completed 12/03/2019, 10-year follow-up.  Dr. Lavon Paganini Mammogram: completed: 07/2023. Dr. Edward Jolly orders. Cervical cancer screening: Hysterectomy.  Followed by Dr. Edward Jolly. Immunizations: tdap UTD 10/2022, Influenza UTD 2024 (encouraged yearly), Shingrix series completed.  Discussed pneumonia vaccine after 65 Infectious disease screening: HIV and hep C completed  DEXA: last completed 07/2020, result -1.1 osteopenic, follow up 5-10 . followed by Dr. Edward Jolly Assistive device: None Oxygen use: None Patient has a Dental home. Hospitalizations/ED visits: Reviewed  Insomnia/neck pain Patient reports insomnia is well-controlled on Ambien 2.5-5 mg daily at bedtime. She has been on this medication since ~2012.  She reports she routinely needs this medication- sometimes she takes a half tab.. She denies negative side effects. She reports she has continued to use the baclofen BID for neck pain and feels it has been very helpful like to continue. Flexeril called  in for acute pain of neck with upcoming flight.    Mixed hyperlipidemia/ On statin therapy/Family history of premature coronary artery disease/ Patient reports she is compliance with atorvastatin 40 mg daily, fenofibrate 145 mg daily and she is taking 3600 mg of omega-3 supplement daily. LDL goal less than 70. CT cardiac scoring 02/07/2021: FINDINGS: Coronary Calcium Score: Left main: 0 Left anterior descending artery: 0.9 Left circumflex artery: 1.5 Right coronary artery: 0 Total: 2.4 Percentile: 63rd Pericardium: Normal. Ascending Aorta: Normal caliber. Non-cardiac: See separate report from [facility]. IMPRESSION: Coronary calcium score of 2.4. This was 63rd percentile for age-, race-, and sex-matched controls.   Prediabetes She has continued exercising routinely and has been eating a healthier diet.  She is continue to slowly lose weight over time intentionally.  Doing well     11/13/2023    9:15 AM 05/14/2023    8:36 AM 02/26/2023    7:44 AM 11/23/2022    9:46 AM 04/24/2022    8:59 AM  Depression screen PHQ 2/9  Decreased Interest 0 0 0 0 1  Down, Depressed, Hopeless 0 0 0 0 0  PHQ - 2 Score 0 0 0 0 1  Altered sleeping 1    2  Tired, decreased energy 0    0  Change in appetite 0    0  Feeling bad or failure about yourself  0    0  Trouble concentrating 0    1  Moving slowly or fidgety/restless 0    0  Suicidal thoughts 0    0  PHQ-9 Score 1    4  Difficult doing work/chores Not difficult at all  11/13/2023    9:16 AM 04/24/2022    9:00 AM 05/02/2021    8:11 AM  GAD 7 : Generalized Anxiety Score  Nervous, Anxious, on Edge 1 1 0  Control/stop worrying 0 0 0  Worry too much - different things 1 0 0  Trouble relaxing 0 1 0  Restless 0 0 0  Easily annoyed or irritable 0 1 0  Afraid - awful might happen 0 0   Total GAD 7 Score 2 3   Anxiety Difficulty Not difficult at all      Immunization History  Administered Date(s) Administered   Fluad  Trivalent(High Dose 65+) 05/14/2023   Influenza Inj Mdck Quad Pf 04/18/2018   Influenza, Quadrivalent, Recombinant, Inj, Pf 04/30/2019   Influenza,inj,Quad PF,6+ Mos 05/17/2017, 04/13/2020, 05/02/2021, 04/24/2022   Influenza-Unspecified 06/20/2015, 05/25/2016, 04/30/2019   PFIZER Comirnaty(Gray Top)Covid-19 Tri-Sucrose Vaccine 05/03/2022   PFIZER(Purple Top)SARS-COV-2 Vaccination 10/03/2019, 10/24/2019, 06/18/2020   PPD Test 03/16/2014   Pfizer Covid-19 Vaccine Bivalent Booster 86yrs & up 06/12/2021   Pfizer(Comirnaty)Fall Seasonal Vaccine 12 years and older 05/21/2023   Respiratory Syncytial Virus Vaccine,Recomb Aduvanted(Arexvy) 05/03/2022   Tdap 04/03/2013, 11/23/2022   Zoster Recombinant(Shingrix) 10/30/2017, 01/27/2018     Past Medical History:  Diagnosis Date   Anemia    PAST HX- ON IRON    Arthritis    neck   Cataract    BEGINNING    COVID 02/2021   COVID-19 03/03/2021   COVID-19 virus infection 05/2019   Diverticulosis of colon    MILD   Elevated cholesterol with elevated triglycerides    Eustachian tube dysfunction, left 05/22/2018   Family history of breast cancer    Family history of cervical cancer    GERD (gastroesophageal reflux disease)    History of COVID-19 02/27/2021   History of rectocele    Insomnia    started ambien 5 ya.   Osteopenia    --left femur   Periodic heart flutter    SVD (spontaneous vaginal delivery)    x 2   Ulnar nerve compression 03/29/2015   Urinary incontinence    Allergies  Allergen Reactions   Amoxicillin Hives   Sulfa Antibiotics Rash    Childhood rxn   Past Surgical History:  Procedure Laterality Date   BLADDER SUSPENSION N/A 11/10/2013   Procedure: TRANSVAGINAL TAPE (TVT) PROCEDURE;  Surgeon: Jacqualin Combes de Gwenevere Ghazi, MD;  Location: [facility];  Service: Gynecology;  Laterality: N/A;   COLONOSCOPY  2011   [place] [facility]    CYSTOSCOPY N/A 11/10/2013   Procedure: CYSTOSCOPY;  Surgeon: Jacqualin Combes de  Gwenevere Ghazi, MD;  Location: [facility];  Service: Gynecology;  Laterality: N/A;   IRRIGATION AND DEBRIDEMENT SEBACEOUS CYST  03/2017   VAGINAL HYSTERECTOMY  11/11    Vag cuff, mild rectocele   WISDOM TOOTH EXTRACTION     Family History  Problem Relation Age of Onset   Arthritis Mother    Hypertension Mother    Hyperlipidemia Mother    Heart disease Mother 61       dec   Hypertension Father    Hyperlipidemia Father    Heart disease Father 48       smoked for 10 yrs., no muscle damage, 1 MI    Pulmonary fibrosis Father        diagnosed end stage, cause unkn.   Urolithiasis Father    Diverticulosis Sister    Breast cancer Sister 71   Heart disease Brother  Hyperlipidemia Brother    Obesity Brother    Heart attack Brother 14       Passed away at 24 of heart attack   Hyperlipidemia Brother    Hypertension Brother    Kidney Stones Brother    Anti-cardiolipin syndrome Brother    Arthritis Maternal Grandmother    Cervical cancer Maternal Grandmother        diagnosed in her 18s or 44s   Arthritis Paternal Grandmother    Diabetes Son    Asthma Son    Seizures Son    Asthma Son    Colon cancer Neg Hx    Colon polyps Neg Hx    Esophageal cancer Neg Hx    Rectal cancer Neg Hx    Stomach cancer Neg Hx    Social History   Social History Narrative   Relocated to [place] from [place]. Due to husband job transfer. 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 She lives with her husband. She has 2 grown sons- oldest recently graduated from college and took a job in [place].; youngest just graduated HS & ia attending [employer].      Has had regular preventive care in [place]. At [facility] w/Dr. Criss Alvine. Only health concern is insomnia that developed about 5 years ago & has been treated with [place]. She has not practiced sleep hygiene.      Occasionally drinks sweet tea.     Allergies as of 11/13/2023        Reactions   Amoxicillin Hives   Sulfa Antibiotics Rash   Childhood rxn        Medication List        Accurate as of November 13, 2023  9:38 AM. If you have any questions, ask your nurse or doctor.          atorvastatin 40 MG tablet Commonly known as: LIPITOR Take 1 tablet (40 mg total) by mouth daily.   baclofen 10 MG tablet Commonly known as: LIORESAL Take 1 tablet (10 mg total) by mouth 2 (two) times daily.   Caltrate 600+D Plus Minerals 600-800 MG-UNIT Tabs Take by mouth.   clobetasol ointment 0.05 % Commonly known as: TEMOVATE Apply topically sparingly to affected area twice a day for 2 weeks if needed for a flare of increased symptoms.  Apply topically sparingly at night twice a week for maintenance dosing.   cyanocobalamin 1000 MCG tablet Take 1,200 mcg by mouth daily.   cyclobenzaprine 10 MG tablet Commonly known as: FLEXERIL Take 1 tablet (10 mg total) by mouth 3 (three) times daily as needed for muscle spasms. Started by: Felix Pacini   fenofibrate 145 MG tablet Commonly known as: TRICOR Take 1 tablet (145 mg total) by mouth daily.   fenofibrate micronized 134 MG capsule Commonly known as: LOFIBRA Take 1 capsule (134 mg total) by mouth daily before breakfast. Started by: Felix Pacini   finasteride 1 MG tablet Commonly known as: PROPECIA Take 1 mg by mouth daily.   Omega 3 1200 MG Caps Take 2 capsules by mouth.   pantoprazole 20 MG tablet Commonly known as: PROTONIX Take 1 tablet (20 mg total) by mouth daily.   spironolactone 50 MG tablet Commonly known as: ALDACTONE Take 50 mg by mouth 2 (two) times daily.   tamoxifen 20 MG tablet Commonly known as: NOLVADEX TAKE 1 TABLET BY MOUTH EVERY DAY   zolpidem 5  MG tablet Commonly known as: AMBIEN Take 0.5-1 tablets (2.5-5 mg total) by mouth at bedtime as needed for sleep.        All past medical history, surgical history, allergies, family history, immunizations andmedications were updated in  the EMR today and reviewed under the history and medication portions of their EMR.      ROS 14 pt review of systems performed and negative (unless mentioned in an HPI)  Objective BP 114/72   Pulse 82   Temp 98.3 F (36.8 C)   Ht 5\' 4"  (1.626 m)   Wt 146 lb 6.4 oz (66.4 kg)   LMP 07/30/2009 (Within Years)   SpO2 96%   BMI 25.13 kg/m  Physical Exam Vitals and nursing note reviewed.  Constitutional:      General: She is not in acute distress.    Appearance: Normal appearance. She is not ill-appearing or toxic-appearing.  HENT:     Head: Normocephalic and atraumatic.     Right Ear: Tympanic membrane, ear canal and external ear normal. There is no impacted cerumen.     Left Ear: Tympanic membrane, ear canal and external ear normal. There is no impacted cerumen.     Nose: No congestion or rhinorrhea.     Mouth/Throat:     Mouth: Mucous membranes are moist.     Pharynx: Oropharynx is clear. No oropharyngeal exudate or posterior oropharyngeal erythema.  Eyes:     General: No scleral icterus.       Right eye: No discharge.        Left eye: No discharge.     Extraocular Movements: Extraocular movements intact.     Conjunctiva/sclera: Conjunctivae normal.     Pupils: Pupils are equal, round, and reactive to light.  Cardiovascular:     Rate and Rhythm: Normal rate and regular rhythm.     Pulses: Normal pulses.     Heart sounds: Normal heart sounds. No murmur heard.    No friction rub. No gallop.  Pulmonary:     Effort: Pulmonary effort is normal. No respiratory distress.     Breath sounds: Normal breath sounds. No stridor. No wheezing, rhonchi or rales.  Chest:     Chest wall: No tenderness.  Abdominal:     General: Abdomen is flat. Bowel sounds are normal. There is no distension.     Palpations: Abdomen is soft. There is no mass.     Tenderness: There is no abdominal tenderness. There is no right CVA tenderness, left CVA tenderness, guarding or rebound.     Hernia: No hernia  is present.  Musculoskeletal:        General: No swelling, tenderness or deformity. Normal range of motion.     Cervical back: Normal range of motion and neck supple. No rigidity or tenderness.     Right lower leg: No edema.     Left lower leg: No edema.  Lymphadenopathy:     Cervical: No cervical adenopathy.  Skin:    General: Skin is warm and dry.     Coloration: Skin is not jaundiced or pale.     Findings: No bruising, erythema, lesion or rash.  Neurological:     General: No focal deficit present.     Mental Status: She is alert and oriented to person, place, and time. Mental status is at baseline.     Cranial Nerves: No cranial nerve deficit.     Sensory: No sensory deficit.     Motor: No weakness.  Coordination: Coordination normal.     Gait: Gait normal.     Deep Tendon Reflexes: Reflexes normal.  Psychiatric:        Mood and Affect: Mood normal.        Behavior: Behavior normal.        Thought Content: Thought content normal.        Judgment: Judgment normal.      No results found.  Assessment/plan: [name] is a 63 y.o. female present for into physical and combined chronic Conditions/illness Management Gastroesophageal reflux disease without esophagitis/long term PPI Stable Continue Protonix. She takes QOD if possible and QD if flares .  Insomnia, unspecified type Stable Continue ambien 2.5-5 mg QHS today.  - [employer] reviewed today - Contract signed.    Mixed hyperlipidemia/ On statin therapy/Family history of premature coronary artery disease/coronary calcium score 2.4/63rd percentile/aortic atherosclerosis Patient is in this 63rd percentile for cardiac calcium scoring with a family history.  2023-consider repeat in 5 years LDL goal less than 70 - She has made great dietary and exercise changes.  Continue Lipitor -Continue fish oil 3600 Continue fenofibrate Lipids collected    Prediabetes Has been well-controlled with her exercise and  dietary changes.  She continues to lose weight slowly. - monitor yearly as long as < 5.9 and fbg normal A1c collected today  Low serum vitamin B12 - B12 and Folate Panel  Vitamin D deficiency/Osteopenia - taking her daily supplement Bone density UTD through GYN Vitamin D-collected today   Iron deficiency: Taking daily iron.  Iron collected   Routine general medical examination at a health care facility (Primary) - CBC - Comprehensive metabolic panel with GFR - TSH Patient was encouraged to exercise greater than 150 minutes a week. Patient was encouraged to choose a diet filled with fresh fruits and vegetables, and lean meats. AVS provided to patient today for education/recommendation on gender specific health and safety maintenance. Colonoscopy: completed 12/03/2019, 10-year follow-up.  Dr. Lavon Paganini Mammogram: completed: 07/2023. Dr. Edward Jolly orders. Cervical cancer screening: Hysterectomy.  Followed by Dr. Edward Jolly. Immunizations: tdap UTD 10/2022, Influenza UTD 2024 (encouraged yearly), Shingrix series completed.  Discussed pneumonia vaccine after 65 Infectious disease screening: HIV and hep C completed  DEXA: last completed 07/2020, result -1.1 osteopenic, follow up 5-10 . followed by Dr. Edward Jolly  Return in about 24 weeks (around 04/29/2024) for Routine chronic condition follow-up.  Orders Placed This Encounter  Procedures   CBC   Comprehensive metabolic panel with GFR   Hemoglobin A1c   Lipid panel   TSH   Vitamin D (25 hydroxy)   B12 and Folate Panel   IBC + Ferritin   Meds ordered this encounter  Medications   atorvastatin (LIPITOR) 40 MG tablet    Sig: Take 1 tablet (40 mg total) by mouth daily.    Dispense:  90 tablet    Refill:  3   baclofen (LIORESAL) 10 MG tablet    Sig: Take 1 tablet (10 mg total) by mouth 2 (two) times daily.    Dispense:  180 each    Refill:  1   pantoprazole (PROTONIX) 20 MG tablet    Sig: Take 1 tablet (20 mg total) by mouth daily.    Dispense:   90 tablet    Refill:  3   zolpidem (AMBIEN) 5 MG tablet    Sig: Take 0.5-1 tablets (2.5-5 mg total) by mouth at bedtime as needed for sleep.    Dispense:  90 tablet  Refill:  1   cyclobenzaprine (FLEXERIL) 10 MG tablet    Sig: Take 1 tablet (10 mg total) by mouth 3 (three) times daily as needed for muscle spasms.    Dispense:  90 tablet    Refill:  0   fenofibrate micronized (LOFIBRA) 134 MG capsule    Sig: Take 1 capsule (134 mg total) by mouth daily before breakfast.    Dispense:  30 capsule    Refill:  11   Referral Orders  No referral(s) requested today    Electronically signed by: Napolean Backbone, DO [facility] [place]

## 2023-11-14 ENCOUNTER — Other Ambulatory Visit: Payer: Self-pay | Admitting: Family Medicine

## 2023-11-14 ENCOUNTER — Encounter: Payer: Self-pay | Admitting: Family Medicine

## 2023-11-25 ENCOUNTER — Encounter: Payer: BC Managed Care – PPO | Admitting: Family Medicine

## 2023-12-19 DIAGNOSIS — L6611 Classic lichen planopilaris: Secondary | ICD-10-CM | POA: Diagnosis not present

## 2023-12-20 ENCOUNTER — Ambulatory Visit
Admission: RE | Admit: 2023-12-20 | Discharge: 2023-12-20 | Disposition: A | Discharge status: Home / Self Care | Payer: BC Managed Care – PPO | Source: Ambulatory Visit | Attending: Obstetrics and Gynecology | Admitting: Obstetrics and Gynecology

## 2023-12-20 DIAGNOSIS — M8588 Other specified disorders of bone density and structure, other site: Secondary | ICD-10-CM | POA: Diagnosis not present

## 2023-12-20 DIAGNOSIS — Z780 Asymptomatic menopausal state: Secondary | ICD-10-CM

## 2023-12-20 DIAGNOSIS — M8580 Other specified disorders of bone density and structure, unspecified site: Secondary | ICD-10-CM

## 2023-12-20 DIAGNOSIS — N958 Other specified menopausal and perimenopausal disorders: Secondary | ICD-10-CM | POA: Diagnosis not present

## 2024-01-01 ENCOUNTER — Ambulatory Visit: Payer: Self-pay | Admitting: Obstetrics and Gynecology

## 2024-02-17 ENCOUNTER — Other Ambulatory Visit: Payer: Self-pay | Admitting: Obstetrics and Gynecology

## 2024-02-17 ENCOUNTER — Other Ambulatory Visit: Payer: Self-pay | Admitting: Radiology

## 2024-02-17 NOTE — Telephone Encounter (Signed)
 Medication refill request: temovate  0.05% Last AEX:  05/09/23 Next AEX: 05/13/24 Last MMG (if hormonal medication request): n/a Refill authorized: please advise

## 2024-02-17 NOTE — Telephone Encounter (Signed)
 Opened in Error.

## 2024-04-29 ENCOUNTER — Ambulatory Visit: Admitting: Family Medicine

## 2024-05-06 ENCOUNTER — Encounter: Payer: Self-pay | Admitting: Family Medicine

## 2024-05-06 ENCOUNTER — Ambulatory Visit: Admitting: Family Medicine

## 2024-05-06 VITALS — BP 118/68 | HR 64 | Temp 98.3°F | Wt 145.0 lb

## 2024-05-06 DIAGNOSIS — R931 Abnormal findings on diagnostic imaging of heart and coronary circulation: Secondary | ICD-10-CM

## 2024-05-06 DIAGNOSIS — E559 Vitamin D deficiency, unspecified: Secondary | ICD-10-CM

## 2024-05-06 DIAGNOSIS — Z79899 Other long term (current) drug therapy: Secondary | ICD-10-CM

## 2024-05-06 DIAGNOSIS — E782 Mixed hyperlipidemia: Secondary | ICD-10-CM

## 2024-05-06 DIAGNOSIS — R7303 Prediabetes: Secondary | ICD-10-CM

## 2024-05-06 DIAGNOSIS — I700 Atherosclerosis of aorta: Secondary | ICD-10-CM

## 2024-05-06 DIAGNOSIS — G8929 Other chronic pain: Secondary | ICD-10-CM

## 2024-05-06 DIAGNOSIS — M8589 Other specified disorders of bone density and structure, multiple sites: Secondary | ICD-10-CM

## 2024-05-06 DIAGNOSIS — M545 Low back pain, unspecified: Secondary | ICD-10-CM

## 2024-05-06 DIAGNOSIS — Z23 Encounter for immunization: Secondary | ICD-10-CM | POA: Diagnosis not present

## 2024-05-06 DIAGNOSIS — K219 Gastro-esophageal reflux disease without esophagitis: Secondary | ICD-10-CM

## 2024-05-06 DIAGNOSIS — F5101 Primary insomnia: Secondary | ICD-10-CM

## 2024-05-06 MED ORDER — ZOLPIDEM TARTRATE 5 MG PO TABS: 2.5000 mg | ORAL_TABLET | Freq: Every evening | ORAL | 1 refills | Status: AC | PRN

## 2024-05-06 MED ORDER — CYCLOBENZAPRINE HCL 10 MG PO TABS: 10.0000 mg | ORAL_TABLET | Freq: Three times a day (TID) | ORAL | 0 refills | Status: AC | PRN

## 2024-05-06 MED ORDER — BACLOFEN 10 MG PO TABS: 10.0000 mg | ORAL_TABLET | Freq: Two times a day (BID) | ORAL | 1 refills | Status: AC

## 2024-05-06 NOTE — Patient Instructions (Addendum)

## 2024-05-06 NOTE — Progress Notes (Signed)
 Patient ID: [name], female  DOB: Aug 18, 1960, 63 y.o.   MRN: [id number] Patient Care Team    Relationship Specialty Notifications Start End  Catherine Charlies LABOR, DO PCP - General Family Medicine  03/29/15   Cathlyn JAYSON Nikki Bobie FORBES, MD Consulting Physician Obstetrics and Gynecology  10/29/19   Burtis Perkins (Inactive) Counselor Genetic Counselor  10/29/19   Shila Gustav GAILS, MD Consulting Physician Gastroenterology  04/13/20     Chief Complaint  Patient presents with   Hyperlipidemia    Insomnia-chronic condition management Influenza vaccine Prevnar 20    Subjective: [name] is a 63 y.o.  Female  present for routine chronic condition management All past medical history, surgical history, allergies, family history, immunizations, medications and social history were updated in the electronic medical record today. All recent labs, ED visits and hospitalizations within the last year were reviewed.  Insomnia/neck pain Patient reports insomnia is well-controlled on Ambien  2.5-5 mg daily at bedtime. She has been on this medication since ~2012.  She reports she routinely needs this medication- sometimes she takes a half tab.. She denies negative side effects. She reports she has continued to use the baclofen  BID for neck pain and feels it has been very helpful like to continue. Flexeril  called in for acute pain of neck with upcoming flight.    Mixed hyperlipidemia/ On statin therapy/Family history of premature coronary artery disease/ Patient reports she is compliance with atorvastatin  40 mg daily, fenofibrate  145 mg daily and she is taking 3600 mg of omega-3 supplement daily. Patient denies chest pain, shortness of breath, dizziness or lower extremity edema.   LDL goal less than 70.  CT cardiac scoring 02/07/2021: Left main: 0 Left anterior descending artery: 0.9 Left circumflex artery: 1.5 Right coronary artery: 0 Total: 2.4 Percentile: 63rd Pericardium:  Normal. Ascending Aorta: Normal caliber. Non-cardiac: See separate report from [facility]. IMPRESSION: Coronary calcium  score of 2.4. This was 63rd percentile for age-, race-, and sex-matched controls.   Prediabetes She has continued exercising routinely and has been eating a healthier diet.  She is continue to slowly loose weight over time intentionally.  Doing well with diet modification.     11/13/2023    9:15 AM 05/14/2023    8:36 AM 02/26/2023    7:44 AM 11/23/2022    9:46 AM 04/24/2022    8:59 AM  Depression screen PHQ 2/9  Decreased Interest 0 0 0 0 1  Down, Depressed, Hopeless 0 0 0 0 0  PHQ - 2 Score 0 0 0 0 1  Altered sleeping 1    2  Tired, decreased energy 0    0  Change in appetite 0    0  Feeling bad or failure about yourself  0    0  Trouble concentrating 0    1  Moving slowly or fidgety/restless 0    0  Suicidal thoughts 0    0  PHQ-9 Score 1    4  Difficult doing work/chores Not difficult at all          11/13/2023    9:16 AM 04/24/2022    9:00 AM 05/02/2021    8:11 AM  GAD 7 : Generalized Anxiety Score  Nervous, Anxious, on Edge 1 1 0  Control/stop worrying 0 0 0  Worry too much - different things 1 0 0  Trouble relaxing 0 1 0  Restless 0 0 0  Easily annoyed or irritable 0 1 0  Afraid - awful might happen 0 0   Total GAD 7 Score 2 3   Anxiety Difficulty Not difficult at all      Immunization History  Administered Date(s) Administered   Fluad Trivalent(High Dose 65+) 05/14/2023   Influenza Inj Mdck Quad Pf 04/18/2018   Influenza, Quadrivalent, Recombinant, Inj, Pf 04/30/2019   Influenza,inj,Quad PF,6+ Mos 05/17/2017, 04/13/2020, 05/02/2021, 04/24/2022   Influenza-Unspecified 06/20/2015, 05/25/2016, 04/30/2019   PFIZER Comirnaty(Gray Top)Covid-19 Tri-Sucrose Vaccine 05/03/2022   PFIZER(Purple Top)SARS-COV-2 Vaccination 10/03/2019, 10/24/2019, 06/18/2020   PPD Test 03/16/2014   Pfizer Covid-19 Vaccine Bivalent Booster 41yrs & up 06/12/2021    Pfizer(Comirnaty)Fall Seasonal Vaccine 12 years and older 05/21/2023   Respiratory Syncytial Virus Vaccine,Recomb Aduvanted(Arexvy) 05/03/2022   Tdap 04/03/2013, 11/23/2022   Zoster Recombinant(Shingrix ) 10/30/2017, 01/27/2018     Past Medical History:  Diagnosis Date   Anemia    PAST HX- ON IRON    Arthritis    neck   Cataract    BEGINNING    COVID 02/2021   COVID-19 03/03/2021   COVID-19 virus infection 05/2019   Diverticulosis of colon    MILD   Elevated cholesterol with elevated triglycerides    Eustachian tube dysfunction, left 05/22/2018   Family history of breast cancer    Family history of cervical cancer    GERD (gastroesophageal reflux disease)    History of COVID-19 02/27/2021   History of rectocele    Insomnia    started ambien  5 ya.   Osteopenia    --left femur   Periodic heart flutter    SVD (spontaneous vaginal delivery)    x 2   Ulnar nerve compression 03/29/2015   Urinary incontinence    Allergies  Allergen Reactions   Amoxicillin Hives   Sulfa Antibiotics Rash    Childhood rxn   Past Surgical History:  Procedure Laterality Date   BLADDER SUSPENSION N/A 11/10/2013   Procedure: TRANSVAGINAL TAPE (TVT) PROCEDURE;  Surgeon: Bobie FORBES Crown de Charlynn FORBES Cary, MD;  Location: [facility];  Service: Gynecology;  Laterality: N/A;   COLONOSCOPY  2011   [place]    CYSTOSCOPY N/A 11/10/2013   Procedure: CYSTOSCOPY;  Surgeon: Bobie FORBES Crown de Charlynn FORBES Cary, MD;  Location: [facility];  Service: Gynecology;  Laterality: N/A;   IRRIGATION AND DEBRIDEMENT SEBACEOUS CYST  03/2017   VAGINAL HYSTERECTOMY  11/11    Vag cuff, mild rectocele   WISDOM TOOTH EXTRACTION     Family History  Problem Relation Age of Onset   Arthritis Mother    Hypertension Mother    Hyperlipidemia Mother    Heart disease Mother 38       dec   Hypertension Father    Hyperlipidemia Father    Heart disease Father 48       smoked for 10 yrs., no muscle damage, 1 MI     Pulmonary fibrosis Father        diagnosed end stage, cause unkn.   Urolithiasis Father    Diverticulosis Sister    Breast cancer Sister 36   Heart disease Brother    Hyperlipidemia Brother    Obesity Brother    Heart attack Brother 41       Passed away at 61 of heart attack   Hyperlipidemia Brother    Hypertension Brother    Kidney Stones Brother    Anti-cardiolipin syndrome Brother    Arthritis Maternal Grandmother    Cervical cancer Maternal Grandmother        diagnosed  in her 52s or 75s   Arthritis Paternal Grandmother    Diabetes Son    Asthma Son    Seizures Son    Asthma Son    Colon cancer Neg Hx    Colon polyps Neg Hx    Esophageal cancer Neg Hx    Rectal cancer Neg Hx    Stomach cancer Neg Hx    Social History   Social History Narrative   Relocated to [place] from [place]. Due to husband job transfer. 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 She lives with her husband. She has 2 grown sons- oldest recently graduated from college and took a job in [place].; youngest just graduated HS & ia attending Audiological scientist.      Has had regular preventive care in [place]. At [facility] 2 w/Dr. Drue. Only health concern is insomnia that developed about 5 years ago & has been treated with ambien . She has not practiced sleep hygiene.      Occasionally drinks sweet tea.     Allergies as of 05/06/2024       Reactions   Amoxicillin Hives   Sulfa Antibiotics Rash   Childhood rxn        Medication List        Accurate as of May 06, 2024  1:17 PM. If you have any questions, ask your nurse or doctor.          atorvastatin  40 MG tablet Commonly known as: LIPITOR Take 1 tablet (40 mg total) by mouth daily.   baclofen  10 MG tablet Commonly known as: LIORESAL  Take 1 tablet (10 mg total) by mouth 2 (two) times daily.   Caltrate 600+D Plus Minerals 600-800 MG-UNIT Tabs Take by mouth.    clobetasol  ointment 0.05 % Commonly known as: TEMOVATE  PLEASE SEE ATTACHED FOR DETAILED DIRECTIONS   cyanocobalamin  1000 MCG tablet Take 1,200 mcg by mouth daily.   cyclobenzaprine  10 MG tablet Commonly known as: FLEXERIL  Take 1 tablet (10 mg total) by mouth 3 (three) times daily as needed for muscle spasms.   fenofibrate  micronized 134 MG capsule Commonly known as: LOFIBRA Take 1 capsule (134 mg total) by mouth daily before breakfast.   finasteride 1 MG tablet Commonly known as: PROPECIA Take 1 mg by mouth daily.   Omega 3 1200 MG Caps Take 2 capsules by mouth.   pantoprazole  20 MG tablet Commonly known as: PROTONIX  Take 1 tablet (20 mg total) by mouth daily.   spironolactone 50 MG tablet Commonly known as: ALDACTONE Take 50 mg by mouth 2 (two) times daily.   tamoxifen  20 MG tablet Commonly known as: NOLVADEX  TAKE 1 TABLET BY MOUTH EVERY DAY   zolpidem  5 MG tablet Commonly known as: AMBIEN  Take 0.5-1 tablets (2.5-5 mg total) by mouth at bedtime as needed for sleep.        All past medical history, surgical history, allergies, family history, immunizations andmedications were updated in the EMR today and reviewed under the history and medication portions of their EMR.      Review of Systems  All other systems reviewed and are negative.  14 pt review of systems performed and negative (unless mentioned in an HPI)  Objective BP 118/68   Pulse 64   Temp 98.3 F (36.8 C)   Wt 145 lb (65.8 kg)   LMP 07/30/2009 (Within Years)   SpO2 95%   BMI 24.89 kg/m  Physical Exam Vitals and nursing note reviewed.  Constitutional:      General: She is not in acute distress.    Appearance: Normal appearance. She is not ill-appearing, toxic-appearing or diaphoretic.  HENT:     Head: Normocephalic and atraumatic.  Eyes:     General: No scleral icterus.       Right eye: No discharge.        Left eye: No discharge.     Extraocular Movements: Extraocular movements  intact.     Conjunctiva/sclera: Conjunctivae normal.     Pupils: Pupils are equal, round, and reactive to light.  Cardiovascular:     Rate and Rhythm: Normal rate and regular rhythm.     Heart sounds: No murmur heard. Pulmonary:     Effort: Pulmonary effort is normal. No respiratory distress.     Breath sounds: Normal breath sounds. No wheezing, rhonchi or rales.  Musculoskeletal:     Right lower leg: No edema.     Left lower leg: No edema.  Skin:    General: Skin is warm.     Findings: No rash.  Neurological:     Mental Status: She is alert and oriented to person, place, and time. Mental status is at baseline.     Motor: No weakness.     Gait: Gait normal.  Psychiatric:        Mood and Affect: Mood normal.        Behavior: Behavior normal.        Thought Content: Thought content normal.        Judgment: Judgment normal.      No results found.  Assessment/plan: [name] is a 63 y.o. female present for chronic Conditions/illness Management Gastroesophageal reflux disease without esophagitis/long term PPI Stable Continue Protonix . She takes QOD if possible and QD if flares .  Insomnia, unspecified type Stable Continue ambien  2.5-5 mg QHS today.  - [employer] reviewed today - Contract signed.    Mixed hyperlipidemia/ On statin therapy/Family history of premature coronary artery disease/coronary calcium  score 2.4/63rd percentile/aortic atherosclerosis Coronary calcium  score: 2.4; 63rd % LDL goal less than 70 - She has made great dietary and exercise changes.  Continue Lipitor -Continue fish oil 3600 Continue fenofibrate   Prediabetes Has been well-controlled with her exercise and dietary changes.  She continues to lose weight slowly. - monitor yearly as long as < 5.9 and fbg normal A1c: 5.7-10/2023  recurrent low back pain: Baclofen  10 mg twice daily Uses Flexeril  for acute flares Low serum vitamin B12 - Labs up-to-date 10/2023 Vitamin D   deficiency/Osteopenia - taking her daily supplement -Labs up-to-date 10/2023 Bone density UTD through GYN Iron deficiency: Taking daily iron.  Iron labs up-to-date 10/2023  Return in about 6 months (around 11/13/2024) for cpe (20 min), Routine chronic condition follow-up.  Orders Placed This Encounter  Procedures   Flu vaccine trivalent PF, 6mos and older(Flulaval,Afluria,Fluarix,Fluzone)   Pneumococcal conjugate vaccine 20-valent   Meds ordered this encounter  Medications   baclofen  (LIORESAL ) 10 MG tablet    Sig: Take 1 tablet (10 mg total) by mouth 2 (two) times daily.    Dispense:  180 each    Refill:  1   cyclobenzaprine  (FLEXERIL ) 10 MG tablet    Sig: Take 1 tablet (10 mg total) by mouth 3 (three) times daily as needed for muscle spasms.    Dispense:  90 tablet    Refill:  0   zolpidem  (AMBIEN ) 5 MG tablet    Sig:  Take 0.5-1 tablets (2.5-5 mg total) by mouth at bedtime as needed for sleep.    Dispense:  90 tablet    Refill:  1   Referral Orders  No referral(s) requested today    Electronically signed by: Charlies Bellini, DO [facility]

## 2024-05-12 NOTE — Progress Notes (Unsigned)
 63 y.o. G2P2 Widowed Caucasian female here for annual exam.    Taking Tamoxifen  due to increased risk of breast cancer.  Seeing oncology.   No bladder concerns.    Using clobetasol  for vulvitis.    Lost her husband July, 2023.  In counseling . Does Grief Share also.    Went to [place] with family.   Planning to return next year.   PCP: Catherine Fuller A, DO   Patient's last menstrual period was 07/30/2009 (within years).           Sexually active: No.  The current method of family planning is status post hysterectomy.    Menopausal hormone therapy:  n/a Exercising: Yes.    Walking 3-4x week  Smoker:  no  OB History  Gravida Para Term Preterm AB Living  2 2    2   SAB IAB Ectopic Multiple Live Births          # Outcome Date GA Lbr Len/2nd Weight Sex Type Anes PTL Lv  2 Para           1 Para             Obstetric Comments  Menarche 63 yo.     HEALTH MAINTENANCE: Last 2 paps:  2013 neg  History of abnormal Pap or positive HPV:  no Mammogram:   08/13/23 Breast Density Cat C, BIRADS Cat 1 neg  Colonoscopy:  12/03/19 - due in 2031 Bone Density:  12/20/23  Result  osteopenia right hip.   Immunization History  Administered Date(s) Administered   Fluad Trivalent(High Dose 65+) 05/14/2023   Influenza Inj Mdck Quad Pf 04/18/2018   Influenza, Quadrivalent, Recombinant, Inj, Pf 04/30/2019   Influenza, Seasonal, Injecte, Preservative Fre 05/06/2024   Influenza,inj,Quad PF,6+ Mos 05/17/2017, 04/13/2020, 05/02/2021, 04/24/2022   Influenza-Unspecified 06/20/2015, 05/25/2016, 04/30/2019   PFIZER Comirnaty(Gray Top)Covid-19 Tri-Sucrose Vaccine 05/03/2022   PFIZER(Purple Top)SARS-COV-2 Vaccination 10/03/2019, 10/24/2019, 06/18/2020   PNEUMOCOCCAL CONJUGATE-20 05/06/2024   PPD Test 03/16/2014   Pfizer Covid-19 Vaccine Bivalent Booster 25yrs & up 06/12/2021   Pfizer(Comirnaty)Fall Seasonal Vaccine 12 years and older 05/21/2023   Respiratory Syncytial Virus Vaccine,Recomb  Aduvanted(Arexvy) 05/03/2022   Tdap 04/03/2013, 11/23/2022   Zoster Recombinant(Shingrix ) 10/30/2017, 01/27/2018      reports that she has never smoked. She has never used smokeless tobacco. She reports current alcohol use. She reports that she does not use drugs.  Past Medical History:  Diagnosis Date   Anemia    PAST HX- ON IRON    Arthritis    neck   Cataract    BEGINNING    COVID 02/2021   COVID-19 03/03/2021   COVID-19 virus infection 05/2019   Diverticulosis of colon    MILD   Elevated cholesterol with elevated triglycerides    Eustachian tube dysfunction, left 05/22/2018   Family history of breast cancer    Family history of cervical cancer    GERD (gastroesophageal reflux disease)    History of COVID-19 02/27/2021   History of rectocele    Insomnia    started ambien  5 ya.   Osteopenia    --left femur   Periodic heart flutter    SVD (spontaneous vaginal delivery)    x 2   Ulnar nerve compression 03/29/2015   Urinary incontinence     Past Surgical History:  Procedure Laterality Date   BLADDER SUSPENSION N/A 11/10/2013   Procedure: TRANSVAGINAL TAPE (TVT) PROCEDURE;  Surgeon: Bobie FORBES Crown de Charlynn FORBES Cary, MD;  Location: [facility];  Service: Gynecology;  Laterality: N/A;   COLONOSCOPY  2011   [place]    CYSTOSCOPY N/A 11/10/2013   Procedure: CYSTOSCOPY;  Surgeon: Bobie FORBES Crown de Charlynn FORBES Cary, MD;  Location: [facility];  Service: Gynecology;  Laterality: N/A;   IRRIGATION AND DEBRIDEMENT SEBACEOUS CYST  03/2017   VAGINAL HYSTERECTOMY  11/11    Vag cuff, mild rectocele   WISDOM TOOTH EXTRACTION      Current Outpatient Medications  Medication Sig Dispense Refill   atorvastatin  (LIPITOR) 40 MG tablet Take 1 tablet (40 mg total) by mouth daily. 90 tablet 3   baclofen  (LIORESAL ) 10 MG tablet Take 1 tablet (10 mg total) by mouth 2 (two) times daily. 180 each 1   Calcium  Carbonate-Vit D-Min (CALTRATE 600+D PLUS MINERALS) 600-800 MG-UNIT TABS Take by  mouth.     clobetasol  ointment (TEMOVATE ) 0.05 % PLEASE SEE ATTACHED FOR DETAILED DIRECTIONS 60 g 0   cyanocobalamin  1000 MCG tablet Take 1,200 mcg by mouth daily.     cyclobenzaprine  (FLEXERIL ) 10 MG tablet Take 1 tablet (10 mg total) by mouth 3 (three) times daily as needed for muscle spasms. 90 tablet 0   fenofibrate  micronized (LOFIBRA) 134 MG capsule Take 1 capsule (134 mg total) by mouth daily before breakfast. 30 capsule 11   finasteride (PROPECIA) 1 MG tablet Take 1 mg by mouth daily.     Omega 3 1200 MG CAPS Take 2 capsules by mouth.     pantoprazole  (PROTONIX ) 20 MG tablet Take 1 tablet (20 mg total) by mouth daily. 90 tablet 3   spironolactone (ALDACTONE) 50 MG tablet Take 50 mg by mouth 2 (two) times daily.     tamoxifen  (NOLVADEX ) 20 MG tablet TAKE 1 TABLET BY MOUTH EVERY DAY 90 tablet 6   zolpidem  (AMBIEN ) 5 MG tablet Take 0.5-1 tablets (2.5-5 mg total) by mouth at bedtime as needed for sleep. 90 tablet 1   No current facility-administered medications for this visit.    ALLERGIES: Amoxicillin and Sulfa antibiotics  Family History  Problem Relation Age of Onset   Arthritis Mother    Hypertension Mother    Hyperlipidemia Mother    Heart disease Mother 56       dec   Hypertension Father    Hyperlipidemia Father    Heart disease Father 65       smoked for 10 yrs., no muscle damage, 1 MI    Pulmonary fibrosis Father        diagnosed end stage, cause unkn.   Urolithiasis Father    Diverticulosis Sister    Breast cancer Sister 108   Heart disease Brother    Hyperlipidemia Brother    Obesity Brother    Heart attack Brother 13       Passed away at 25 of heart attack   Hyperlipidemia Brother    Hypertension Brother    Kidney Stones Brother    Anti-cardiolipin syndrome Brother    Arthritis Maternal Grandmother    Cervical cancer Maternal Grandmother        diagnosed in her 45s or 40s   Arthritis Paternal Grandmother    Diabetes Son    Asthma Son    Seizures Son     Asthma Son    Colon cancer Neg Hx    Colon polyps Neg Hx    Esophageal cancer Neg Hx    Rectal cancer Neg Hx    Stomach cancer Neg Hx     Review of Systems  All  other systems reviewed and are negative.   PHYSICAL EXAM:  BP 118/68 (BP Location: Left Arm, Patient Position: Sitting)   Pulse 67   Ht 5' 3.5 (1.613 m)   Wt 141 lb (64 kg)   LMP 07/30/2009 (Within Years)   SpO2 97%   BMI 24.59 kg/m     General appearance: alert, cooperative and appears stated age Head: normocephalic, without obvious abnormality, atraumatic Neck: no adenopathy, supple, symmetrical, trachea midline and thyroid  normal to inspection and palpation Lungs: clear to auscultation bilaterally Breasts: normal appearance, no masses or tenderness, No nipple retraction or dimpling, No nipple discharge or bleeding, No axillary adenopathy Heart: regular rate and rhythm Abdomen: soft, non-tender; no masses, no organomegaly Extremities: extremities normal, atraumatic, no cyanosis or edema Skin: skin color, texture, turgor normal. No rashes or lesions Lymph nodes: cervical, supraclavicular, and axillary nodes normal. Neurologic: grossly normal  Pelvic: External genitalia:  no lesions.  Scattered cherry hemangioma.              No abnormal inguinal nodes palpated.              Urethra:  normal appearing urethra with no masses, tenderness or lesions              Bartholins and Skenes: normal                 Vagina: normal appearing vagina with normal color and discharge, no lesions.  First degree cystocele.  Urethra well supported.               Cervix: absent              Pap taken: no Bimanual Exam:  Uterus:  absent              Adnexa: no mass, fullness, tenderness              Rectal exam: yes.  Confirms.              Anus:  normal sphincter tone, no lesions  Chaperone was present for exam:  Kari HERO, CMA  ASSESSMENT: Well woman visit with gynecologic exam. Status post TVH for prolapse.  Status post TVT.   Cystocele.  First degree.  Stable. Increased lifetime risk of breast cancer - 25% by TC model.  Sister tested negative, so genetic testing declined by patient.  On Tamoxifen .  Followed by oncology. Osteopenia.  Chronic vulvitis.  Using Clobetasol .  PHQ-2-9: 1  PLAN: Mammogram screening discussed. Self breast awareness reviewed. Pap and HRV collected:  no.  Not indicated.  Guidelines for Calcium , Vitamin D , regular exercise program including cardiovascular and weight bearing exercise. Medication refills:  Clobetasol  ointment.  Labs with PCP.  BMD in 2 years.  Follow up:  yearly and prn.

## 2024-05-13 ENCOUNTER — Ambulatory Visit (INDEPENDENT_AMBULATORY_CARE_PROVIDER_SITE_OTHER): Payer: BC Managed Care – PPO | Admitting: Obstetrics and Gynecology

## 2024-05-13 ENCOUNTER — Encounter: Payer: Self-pay | Admitting: Obstetrics and Gynecology

## 2024-05-13 VITALS — BP 118/68 | HR 67 | Ht 63.5 in | Wt 141.0 lb

## 2024-05-13 DIAGNOSIS — M8580 Other specified disorders of bone density and structure, unspecified site: Secondary | ICD-10-CM | POA: Diagnosis not present

## 2024-05-13 DIAGNOSIS — Z1331 Encounter for screening for depression: Secondary | ICD-10-CM

## 2024-05-13 DIAGNOSIS — N763 Subacute and chronic vulvitis: Secondary | ICD-10-CM

## 2024-05-13 DIAGNOSIS — Z01419 Encounter for gynecological examination (general) (routine) without abnormal findings: Secondary | ICD-10-CM | POA: Diagnosis not present

## 2024-05-13 DIAGNOSIS — Z5181 Encounter for therapeutic drug level monitoring: Secondary | ICD-10-CM | POA: Diagnosis not present

## 2024-05-13 MED ORDER — CLOBETASOL PROPIONATE 0.05 % EX OINT: TOPICAL_OINTMENT | CUTANEOUS | 2 refills | Status: DC

## 2024-05-13 NOTE — Patient Instructions (Signed)

## 2024-07-13 ENCOUNTER — Telehealth: Payer: Self-pay | Admitting: Hematology and Oncology

## 2024-07-13 NOTE — Telephone Encounter (Signed)
 I spoke with patient and resceduled 08/10/24 appt to 08/11/24. Patient is aware of new appt date and time.

## 2024-07-15 ENCOUNTER — Other Ambulatory Visit: Payer: Self-pay | Admitting: Obstetrics and Gynecology

## 2024-07-15 DIAGNOSIS — Z1231 Encounter for screening mammogram for malignant neoplasm of breast: Secondary | ICD-10-CM

## 2024-08-03 ENCOUNTER — Telehealth: Payer: Self-pay | Admitting: Hematology and Oncology

## 2024-08-03 NOTE — Telephone Encounter (Signed)
 I spoke with patient to reschedule appointment from 08/11/2024 to 08/26/2024. Patient aware of date/time.

## 2024-08-10 ENCOUNTER — Ambulatory Visit: Payer: BC Managed Care – PPO | Admitting: Hematology and Oncology

## 2024-08-11 ENCOUNTER — Inpatient Hospital Stay: Payer: Self-pay | Admitting: Hematology and Oncology

## 2024-08-14 ENCOUNTER — Ambulatory Visit
Admission: RE | Admit: 2024-08-14 | Discharge: 2024-08-14 | Disposition: A | Discharge status: Home / Self Care | Source: Ambulatory Visit

## 2024-08-14 DIAGNOSIS — Z1231 Encounter for screening mammogram for malignant neoplasm of breast: Secondary | ICD-10-CM

## 2024-08-20 ENCOUNTER — Ambulatory Visit: Payer: Self-pay | Admitting: Obstetrics and Gynecology

## 2024-08-25 ENCOUNTER — Telehealth: Payer: Self-pay | Admitting: Hematology and Oncology

## 2024-08-25 NOTE — Telephone Encounter (Signed)
 I spoke with patient as she called in to reschedule her MD appoint,ment from 08/26/2024 to 09/22/2024.

## 2024-08-26 ENCOUNTER — Inpatient Hospital Stay: Admitting: Hematology and Oncology

## 2024-09-03 ENCOUNTER — Other Ambulatory Visit: Payer: Self-pay | Admitting: Obstetrics and Gynecology

## 2024-09-03 NOTE — Telephone Encounter (Signed)
 Please contact patient in follow up to a notification I received from her [facility] prescription plan.   Her Rx for Clobetasol  ointment is not a covered expense.   Please have her let me know what steroid prescription ointments or creams are covered with her insurance.  I would suggest Valisone ointment 0.1% as an alternative.  This would be applied twice a day for 2 weeks for a flare and then twice a week at bedtime for maintenance dosing as needed.  Disp 45 gram, RF 1.

## 2024-09-03 NOTE — Telephone Encounter (Signed)
 Spoke with patient, advised per Dr. Nikki.  Patient states she picked up refill of clobetasol  ointment on 09/02/24, OOP cost was $30, would like alternative sent for next refill.   Patient request Rx to be held until ready to be filled, note added to pended RX.   Patient will check with insurance in the meantime, if Valisone ointment not covered, she will return call.   Rx pended

## 2024-09-22 ENCOUNTER — Inpatient Hospital Stay: Admitting: Hematology and Oncology

## 2024-11-18 ENCOUNTER — Encounter: Admitting: Family Medicine

## 2025-05-18 ENCOUNTER — Ambulatory Visit: Admitting: Obstetrics and Gynecology
